# Patient Record
Sex: Female | Born: 1945 | Race: White | Hispanic: No | Marital: Single | Smoking: Former smoker
Health system: Southern US, Community
[De-identification: ages and names within clinical notes are randomized; demographics above are authoritative.]

## PROBLEM LIST (undated history)

## (undated) DIAGNOSIS — M810 Age-related osteoporosis without current pathological fracture: Secondary | ICD-10-CM

## (undated) DIAGNOSIS — Z87442 Personal history of urinary calculi: Secondary | ICD-10-CM

## (undated) DIAGNOSIS — E785 Hyperlipidemia, unspecified: Secondary | ICD-10-CM

## (undated) DIAGNOSIS — K219 Gastro-esophageal reflux disease without esophagitis: Secondary | ICD-10-CM

## (undated) DIAGNOSIS — F39 Unspecified mood [affective] disorder: Secondary | ICD-10-CM

## (undated) DIAGNOSIS — K5900 Constipation, unspecified: Secondary | ICD-10-CM

## (undated) DIAGNOSIS — I2510 Atherosclerotic heart disease of native coronary artery without angina pectoris: Secondary | ICD-10-CM

## (undated) DIAGNOSIS — F341 Dysthymic disorder: Secondary | ICD-10-CM

## (undated) DIAGNOSIS — Z89442 Acquired absence of left ankle: Secondary | ICD-10-CM

## (undated) DIAGNOSIS — Z8719 Personal history of other diseases of the digestive system: Secondary | ICD-10-CM

## (undated) DIAGNOSIS — R42 Dizziness and giddiness: Secondary | ICD-10-CM

## (undated) DIAGNOSIS — M6281 Muscle weakness (generalized): Secondary | ICD-10-CM

## (undated) DIAGNOSIS — I10 Essential (primary) hypertension: Secondary | ICD-10-CM

## (undated) DIAGNOSIS — Z87728 Personal history of other specified (corrected) congenital malformations of nervous system and sense organs: Secondary | ICD-10-CM

## (undated) DIAGNOSIS — G4730 Sleep apnea, unspecified: Secondary | ICD-10-CM

## (undated) DIAGNOSIS — G629 Polyneuropathy, unspecified: Secondary | ICD-10-CM

## (undated) DIAGNOSIS — Z87440 Personal history of urinary (tract) infections: Secondary | ICD-10-CM

## (undated) DIAGNOSIS — G8929 Other chronic pain: Secondary | ICD-10-CM

## (undated) DIAGNOSIS — R609 Edema, unspecified: Secondary | ICD-10-CM

## (undated) DIAGNOSIS — J449 Chronic obstructive pulmonary disease, unspecified: Secondary | ICD-10-CM

## (undated) DIAGNOSIS — G839 Paralytic syndrome, unspecified: Secondary | ICD-10-CM

## (undated) DIAGNOSIS — M4150 Other secondary scoliosis, site unspecified: Secondary | ICD-10-CM

## (undated) DIAGNOSIS — F0390 Unspecified dementia without behavioral disturbance: Secondary | ICD-10-CM

## (undated) DIAGNOSIS — E669 Obesity, unspecified: Secondary | ICD-10-CM

## (undated) DIAGNOSIS — G4700 Insomnia, unspecified: Secondary | ICD-10-CM

## (undated) HISTORY — PX: OTHER SURGICAL HISTORY: SHX169

## (undated) HISTORY — PX: COLON SURGERY: SHX602

## (undated) HISTORY — PX: ROBOT ASSISTED LAPAROSCOPIC COMPLETE CYSTECT ILEAL CONDUIT: SHX5139

## (undated) HISTORY — PX: COLOSTOMY: SHX63

---

## 2004-01-27 ENCOUNTER — Ambulatory Visit: Payer: Self-pay | Admitting: Gastroenterology

## 2005-02-09 ENCOUNTER — Ambulatory Visit: Payer: Self-pay | Admitting: Cardiology

## 2005-02-09 ENCOUNTER — Inpatient Hospital Stay (HOSPITAL_COMMUNITY): Admission: EM | Admit: 2005-02-09 | Discharge: 2005-02-11 | Payer: Self-pay | Admitting: Emergency Medicine

## 2005-02-09 ENCOUNTER — Encounter: Payer: Self-pay | Admitting: Cardiology

## 2005-03-22 ENCOUNTER — Encounter: Admission: RE | Admit: 2005-03-22 | Discharge: 2005-03-22 | Payer: Self-pay | Admitting: Family Medicine

## 2005-05-05 ENCOUNTER — Ambulatory Visit: Payer: Self-pay | Admitting: Gastroenterology

## 2007-10-16 ENCOUNTER — Inpatient Hospital Stay (HOSPITAL_COMMUNITY): Admission: EM | Admit: 2007-10-16 | Discharge: 2007-11-16 | Payer: Self-pay | Admitting: Emergency Medicine

## 2007-10-16 ENCOUNTER — Encounter: Payer: Self-pay | Admitting: Emergency Medicine

## 2007-10-18 ENCOUNTER — Encounter (INDEPENDENT_AMBULATORY_CARE_PROVIDER_SITE_OTHER): Payer: Self-pay | Admitting: Surgery

## 2008-02-25 ENCOUNTER — Ambulatory Visit: Payer: Self-pay | Admitting: Diagnostic Radiology

## 2008-02-25 ENCOUNTER — Inpatient Hospital Stay (HOSPITAL_COMMUNITY): Admission: AD | Admit: 2008-02-25 | Discharge: 2008-03-11 | Payer: Self-pay | Admitting: Internal Medicine

## 2008-02-25 ENCOUNTER — Encounter: Payer: Self-pay | Admitting: Emergency Medicine

## 2008-03-04 ENCOUNTER — Encounter (INDEPENDENT_AMBULATORY_CARE_PROVIDER_SITE_OTHER): Payer: Self-pay | Admitting: Internal Medicine

## 2008-03-06 ENCOUNTER — Ambulatory Visit: Payer: Self-pay | Admitting: Gastroenterology

## 2008-03-06 ENCOUNTER — Encounter (INDEPENDENT_AMBULATORY_CARE_PROVIDER_SITE_OTHER): Payer: Self-pay | Admitting: Internal Medicine

## 2008-06-04 ENCOUNTER — Emergency Department (HOSPITAL_COMMUNITY): Admission: EM | Admit: 2008-06-04 | Discharge: 2008-06-05 | Payer: Self-pay | Admitting: Emergency Medicine

## 2008-07-18 ENCOUNTER — Encounter: Admission: RE | Admit: 2008-07-18 | Discharge: 2008-07-18 | Payer: Self-pay | Admitting: Surgery

## 2009-02-26 ENCOUNTER — Encounter: Payer: Self-pay | Admitting: Cardiology

## 2009-03-05 ENCOUNTER — Encounter: Payer: Self-pay | Admitting: Cardiology

## 2009-03-28 ENCOUNTER — Encounter: Payer: Self-pay | Admitting: Cardiology

## 2009-09-11 ENCOUNTER — Encounter: Payer: Self-pay | Admitting: Cardiology

## 2009-12-10 ENCOUNTER — Encounter: Payer: Self-pay | Admitting: Cardiology

## 2009-12-30 ENCOUNTER — Encounter: Payer: Self-pay | Admitting: Cardiology

## 2010-01-15 ENCOUNTER — Emergency Department (HOSPITAL_COMMUNITY): Admission: EM | Admit: 2010-01-15 | Discharge: 2010-01-15 | Payer: Self-pay | Admitting: Emergency Medicine

## 2010-01-16 DIAGNOSIS — J4489 Other specified chronic obstructive pulmonary disease: Secondary | ICD-10-CM | POA: Insufficient documentation

## 2010-01-16 DIAGNOSIS — I2510 Atherosclerotic heart disease of native coronary artery without angina pectoris: Secondary | ICD-10-CM | POA: Insufficient documentation

## 2010-01-16 DIAGNOSIS — K219 Gastro-esophageal reflux disease without esophagitis: Secondary | ICD-10-CM

## 2010-01-16 DIAGNOSIS — J449 Chronic obstructive pulmonary disease, unspecified: Secondary | ICD-10-CM | POA: Insufficient documentation

## 2010-01-20 ENCOUNTER — Ambulatory Visit: Payer: Self-pay | Admitting: Cardiology

## 2010-01-20 ENCOUNTER — Encounter: Payer: Self-pay | Admitting: Cardiology

## 2010-01-20 DIAGNOSIS — R079 Chest pain, unspecified: Secondary | ICD-10-CM | POA: Insufficient documentation

## 2010-01-20 DIAGNOSIS — E785 Hyperlipidemia, unspecified: Secondary | ICD-10-CM | POA: Insufficient documentation

## 2010-01-20 DIAGNOSIS — I10 Essential (primary) hypertension: Secondary | ICD-10-CM

## 2010-01-20 DIAGNOSIS — R0602 Shortness of breath: Secondary | ICD-10-CM

## 2010-01-27 ENCOUNTER — Telehealth (INDEPENDENT_AMBULATORY_CARE_PROVIDER_SITE_OTHER): Payer: Self-pay | Admitting: Radiology

## 2010-01-28 ENCOUNTER — Ambulatory Visit: Payer: Self-pay

## 2010-01-28 ENCOUNTER — Encounter (HOSPITAL_COMMUNITY)
Admission: RE | Admit: 2010-01-28 | Discharge: 2010-03-14 | Payer: Self-pay | Source: Home / Self Care | Attending: Cardiology | Admitting: Cardiology

## 2010-01-28 ENCOUNTER — Encounter: Payer: Self-pay | Admitting: Cardiovascular Disease

## 2010-01-28 ENCOUNTER — Ambulatory Visit: Payer: Self-pay | Admitting: Cardiovascular Disease

## 2010-02-02 ENCOUNTER — Telehealth: Payer: Self-pay | Admitting: Cardiovascular Disease

## 2010-04-14 NOTE — Letter (Signed)
Summary: Patient Encounter  Patient Encounter   Imported By: Marylou Mccoy 02/11/2010 09:43:33  _____________________________________________________________________  External Attachment:    Type:   Image     Comment:   External Document

## 2010-04-14 NOTE — Assessment & Plan Note (Signed)
Summary: dx chest pains/mt   Visit Type:  Follow-up Referring Provider:  [place] Primary Provider:  Royanne Foots, MD  CC:  chest pain.  History of Present Illness: The patient was referred for evaluation of chest discomfort.  She has no prior cardiac history though it EKG was abnormal years ago suggesting a possible infarct. She was in the emergency room recently because of chest discomfort. I reviewed these records. Enzymes were negative. Chest x-ray was negative. She was told she was having anxiety. She was treated with hydrocodone, Maalox and Pepcid. The pain was on 11/3. She been having similar symptoms for 6 months but it had become slowly progressive. She described a chest heaviness are hollow feeling in her mid chest radiating to both sides of the sternum. She had some left neck discomfort. She was short of breath. There was some mild diaphoresis. It came on at rest. It didn't go away until she left the emergency room. She has had a couple of episodes since then one lasting one hour and another 20 minutes. At peak intensity is 9/10. It is not like other symptoms she has had in the past. He does seem to be somewhat positional made worse lying flat back or taking a deep breath. She has not had fevers cough or chills.  Current Medications (verified): 1)  Spiriva Handihaler 18 Mcg Caps (Tiotropium Bromide Monohydrate) .... As Directed 2)  Aspirin 81 Mg  Tabs (Aspirin) .Marland Kitchen.. 1 By Mouth Daily 3)  Ultram 50 Mg Tabs (Tramadol Hcl) .... As Needed 4)  Nadolol 40 Mg Tabs (Nadolol) .Marland Kitchen.. 1 By Mouth Daily 5)  Protonix 40 Mg Solr (Pantoprazole Sodium) .Marland Kitchen.. 1 By Mouth Daily 6)  Sucralfate 1 Gm/58ml Susp (Sucralfate) .... Qid 7)  Zinc Oxide 20 % Oint (Zinc Oxide) .... As Directed 8)  Zyrtec Allergy 10 Mg Tabs (Cetirizine Hcl) .Marland Kitchen.. 1 By Mouth Daily 9)  Trazodone Hcl 100 Mg Tabs (Trazodone Hcl) .... As Needed 10)  Complete Allergy 25 Mg Caps (Diphenhydramine Hcl) .Marland Kitchen.. 1 By Mouth As Needed 11)  Klor-Con 10  10 Meq Cr-Tabs (Potassium Chloride) .Marland Kitchen.. 1 By Mouth As Needed 12)  Miralax  Powd (Polyethylene Glycol 3350) .... As Directed 13)  Ativan 0.5 Mg Tabs (Lorazepam) .Marland Kitchen.. 1 By Mouth As Needed 14)  Furosemide 20 Mg Tabs (Furosemide) .Marland Kitchen.. 1 By Mouth Daily As Needed 15)  Systane Preservative Free 0.4-0.3 % Soln (Polyethyl Glycol-Propyl Glycol) .... Three Times A Day 16)  Pataday 0.2 % Soln (Olopatadine Hcl) .... As Directed 17)  Effexor Xr 75 Mg Xr24h-Cap (Venlafaxine Hcl) .Marland Kitchen.. 1 By Mouth Daily 18)  Calmoseptine 0.44-20.625 % Oint (Menthol-Zinc Oxide) .... Three Times A Day  Allergies (verified): 1)  ! Penicillin 2)  ! Erythromycin 3)  ! Codeine  Past History:  Past Medical History: Spina bifida Scoliosis COPD Hyperlipidemia x2 years Hypertension borderline Osteoporosis Gastroesophageal reflux Depression/dysthymic disorder  Past Surgical History: Amputation of right foot Bilateral leg surgeries as a child Cholecystectomy Diverting colostomy Ureterostomy     Family History:  The patient's mother died at age 36 secondary to breast  cancer. Her father was killed in the [place] War. She has two sisters both who have had double mastectomies, one secondary to breast cancer and the other for prophylaxis for breast cancer. One of her sisters also has had an acute MI.  Social History: The patient is divorced and lives in [place].  She uses an electric wheelchair to get around. She quit smoking approximately 5 years ago  but prior to this was a 1/2 pack a day smoker. She is permanently disabled. Denies any alcohol or drug use. Does not have any children.  Review of Systems       Dizziness. Otherwise as stated in the history of present illness negative for other systems.  Vital Signs:  Patient profile:   65 year old female Pulse rate:   68 / minute Resp:     16 per minute BP sitting:   110 / 58  (left arm)  Vitals Entered By: Marrion Coy, CNA (January 20, 2010 9:02 AM)  Physical  Exam  General:  No acute distress Head:  normocephalic and atraumatic Eyes:  Dysconjugate gaze Mouth:  Poor dentition otherwise unremarkable Neck:  Neck supple, no JVD. No masses, thyromegaly or abnormal cervical nodes. Chest Wall:  no deformities or breast masses noted Lungs:  Clear bilaterally to auscultation and percussion. Abdomen:  No rebound, no guarding, positive bowel sounds, unable to appreciate midline pulsatile mass or organomegaly the patient in a wheelchair.  Positive colostomy and ileostomy Msk:  diffuse muscle atrophy patient in a wheelchair, scoliosis Extremities:  status post left foot amputation, moderate edema right foot Neurologic:  Alert and oriented x 3. Cervical Nodes:  no significant adenopathy Psych:  Normal affect.   Detailed Cardiovascular Exam  Neck    Carotids: Carotids full and equal bilaterally without bruits.      Neck Veins: Normal, no JVD.    Heart    Palpation: normal PMI with no thrills palpable.      Auscultation: regular rate and rhythm, S1, S2 without murmurs, rubs, gallops, or clicks.    Vascular    Abdominal Aorta: Unable to assess with the patient seated    Femoral Pulses: Unable to assess with the patient seated    Pedal Pulses: Difficult to assess in the right leg with edema. She is status post left foot amputation   EKG  Procedure date:  01/20/2010  Findings:      Sinus rhythm, rate 68, left axis deviation, no acute ST-T wave changes  Impression & Recommendations:  Problem # 1:  CHEST PAIN (ICD-786.50) Her chest pain is somewhat atypical but she does have cardiovascular risk factors. First I will check a d-dimer. If this is negative I would not consider CT but would perform a pharmacologic stress perfusion study to rule out ischemia. Orders: T-D-Dimer Fibrin Derivatives Quantitive [phone])  Problem # 2:  ESSENTIAL HYPERTENSION, BENIGN (ICD-401.1) Her blood pressure is controlled. She will continue the meds as  listed.  Problem # 3:  DYSLIPIDEMIA (ICD-272.4) Goals for her dyslipidemia will be sent based on the above workup.  Other Orders: EKG w/ Interpretation (93000)  Patient Instructions: 1)  Your physician recommends that you schedule a follow-up appointment as needed after testing 2)  Your physician recommends that you have  lab work today: D-Dimer  3)  Your physician recommends that you continue on your current medications as directed. Please refer to the Current Medication list given to you today. 4)  If the lab work is abnormal you will be scheduled for a spiral CT.

## 2010-04-14 NOTE — Letter (Signed)
Summary: Physician Visit Form  Physician Visit Form   Imported By: Marylou Mccoy 02/11/2010 09:17:42  _____________________________________________________________________  External Attachment:    Type:   Image     Comment:   External Document

## 2010-04-14 NOTE — Miscellaneous (Signed)
Summary: order for adenosine myoview  Clinical Lists Changes  Orders: Added new Referral order of Nuclear Stress Test (Nuc Stress Test) - Signed 

## 2010-04-14 NOTE — Progress Notes (Signed)
Summary: pt calling for results of echo  Phone Note Call from Patient   Caller: Patient [phone] Reason for Call: Talk to Nurse Summary of Call: pt calling re results of echo done last week Initial call taken by: Glynda Jaeger,  February 02, 2010 11:31 AM  Follow-up for Phone Call        Phone Call Completed PT AWARE OF MYOVIEW RESULTS NO ECHO DONE Follow-up by: Scherrie Bateman, LPN,  February 02, 2010 12:45 PM

## 2010-04-14 NOTE — Assessment & Plan Note (Signed)
Summary: Cardiology Nuclear Testing  Nuclear Med Background Indications for Stress Test: Evaluation for Ischemia, Post Hospital  Indications Comments: 01/15/10 [facility] ER - CP / SOB (-) enzymes  History: COPD, Echo  History Comments: '09 Echo  EF= 60-65%  Symptoms: Chest Pain, DOE, Fatigue, SOB    Nuclear Pre-Procedure Cardiac Risk Factors: History of Smoking, Hypertension, Lipids Caffeine/Decaff Intake: None NPO After: 6:00 PM Lungs: clear IV 0.9% NS with Angio Cath: 24g     IV Site: R Antecubital IV Started by: Irean Hong, RN Chest Size (in) 42     Cup Size C     Height (in): 59 Weight (lb): 206 BMI: 41.76 Tech Comments: Held nadalol this am.  Nuclear Med Study 1 or 2 day study:  1 day     Stress Test Type:  Eugenie Birks Reading MD:  Charlton Haws, MD     Referring MD:  Shela Commons.Hochrein Resting Radionuclide:  Technetium 63m Tetrofosmin     Resting Radionuclide Dose:  11 mCi  Stress Radionuclide:  Technetium 53m Tetrofosmin     Stress Radionuclide Dose:  33 mCi   Stress Protocol  Max Systolic BP: 112 mm Hg Lexiscan: 0.4 mg   Stress Test Technologist:  Milana Na, EMT-P     Nuclear Technologist:  Domenic Polite, CNMT  Rest Procedure  Myocardial perfusion imaging was performed at rest 45 minutes following the intravenous administration of Technetium 45m Tetrofosmin.  Stress Procedure  The patient received IV Lexiscan 0.4 mg over 15-seconds.  Technetium 63m Tetrofosmin injected at 30-seconds.  There were no significant changes with infusion.  Quantitative spect images were obtained after a 45 minute delay.  QPS Raw Data Images:  Normal; no motion artifact; normal heart/lung ratio. Stress Images:  Normal homogeneous uptake in all areas of the myocardium. Rest Images:  Normal homogeneous uptake in all areas of the myocardium. Subtraction (SDS):  Normal Transient Ischemic Dilatation:  .98  (Normal <1.22)  Lung/Heart Ratio:  .33  (Normal <0.45)  Quantitative Gated Spect  Images QGS EDV:  56 ml QGS ESV:  18 ml QGS EF:  68 % QGS cine images:  normal  Findings Normal nuclear study      Overall Impression  Exercise Capacity: Lexiscan with no exercise. BP Response: Normal blood pressure response. Clinical Symptoms: Dyspnea and Dizzy ECG Impression: No significant ST segment change suggestive of ischemia. Overall Impression: Normal stress nuclear study.  Appended Document: Cardiology Nuclear Testing Negative adequate ETT.  Appended Document: Cardiology Nuclear Testing PT AWARE./CY

## 2010-04-14 NOTE — Letter (Signed)
Summary: [facility] - Admission Hx & Physical  [facility] - Admission Hx & Physical   Imported By: Marylou Mccoy 02/11/2010 09:48:42  _____________________________________________________________________  External Attachment:    Type:   Image     Comment:   External Document

## 2010-04-14 NOTE — Progress Notes (Signed)
Summary: nuc pre-procedure  Phone Note Outgoing Call   Call placed by: Domenic Polite, CNMT,  January 27, 2010 4:08 PM Call placed to: Patient Summary of Call: Reviewed information on Myoview Information Sheet (see scanned document for further details).  Spoke with nursing home.  Initial call taken by: Domenic Polite, CNMT,  January 27, 2010 4:08 PM     Nuclear Med Background Indications for Stress Test: Evaluation for Ischemia, Post Hospital  Indications Comments: 01/15/10 [facility] ER - CP / SOB (-) enzymes  History: COPD, Echo  History Comments: '09 Echo  EF= 60-65%  Symptoms: Chest Pain, SOB    Nuclear Pre-Procedure Cardiac Risk Factors: History of Smoking, Hypertension, Lipids

## 2010-05-26 LAB — CBC
HCT: 38.8 % (ref 36.0–46.0)
Hemoglobin: 11.9 g/dL — ABNORMAL LOW (ref 12.0–15.0)
MCHC: 30.7 g/dL (ref 30.0–36.0)
RDW: 14.3 % (ref 11.5–15.5)
WBC: 8.8 10*3/uL (ref 4.0–10.5)

## 2010-05-26 LAB — DIFFERENTIAL
Basophils Relative: 1 % (ref 0–1)
Eosinophils Absolute: 0.4 10*3/uL (ref 0.0–0.7)
Eosinophils Relative: 4 % (ref 0–5)
Lymphs Abs: 3.2 10*3/uL (ref 0.7–4.0)
Monocytes Relative: 7 % (ref 3–12)

## 2010-05-26 LAB — COMPREHENSIVE METABOLIC PANEL
ALT: 23 U/L (ref 0–35)
AST: 23 U/L (ref 0–37)
Alkaline Phosphatase: 96 U/L (ref 39–117)
CO2: 30 mEq/L (ref 19–32)
Calcium: 8.7 mg/dL (ref 8.4–10.5)
GFR calc Af Amer: >60 mL/min (ref >60)
Potassium: 3.7 mEq/L (ref 3.5–5.1)
Sodium: 140 mEq/L (ref 135–145)
Total Protein: 6.8 g/dL (ref 6.0–8.3)

## 2010-05-26 LAB — POCT CARDIAC MARKERS
CKMB, poc: <1 ng/mL — ABNORMAL LOW (ref 1.0–8.0)
Myoglobin, poc: 40.9 ng/mL (ref 12–200)
Troponin i, poc: <0.05 ng/mL (ref 0.00–0.09)

## 2010-07-28 NOTE — Procedures (Signed)
EEG NUMBER:  [id number]   AGE:  65 years old.   This is a female patient currently in room 4740 at [facility].   ORDERING PHYSICIAN:  [facility], Dr. Susie Cassette.   The patient's recording was performed on March 06, 2008 and reported  on March 07, 2008.  In dictation, the patient was recorded for 20.5  minutes in a portable study and is described as awake, and alert, right-  handed Caucasian individual, not exposed to hyperventilation or photic  stimulation, due to the portability of the machine.   MEDICATIONS:  MiraLax, Norvasc, Toprol, TriCor, Diamox, Protonix,  Duragesic, vancomycin, Phenergan, Zofran, Ambien, Tylenol, Reglan,  fentanyl patch, and Apresoline.  The patient is due to nausea on a  restricted diet.   HISTORY:  This is a 65 year old female with a history of Spina bifida,  communicating hydrocephalus who since September has been seriously ill.  She developed an incarcerated small bowel hernia that had to be  surgically treated and the bowel partially  removed.  She had fecal impaction.  She she returned to the hospital a week after  her discharge from [facility] and shows [facility] for  her next admission.  Also, her outpatient physicians are [facility] physicians.  She developed nausea and severe headaches.  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  Also, no hypertensive crisis has been recorded and an LP was attempted,  but because of the history of Spina bifida and deep venous thrombosis in  the past, had to be deferred until her anticoagulation.  There is an actual concern of an infection spreading systemically,this  had been voiced, but she did not develop any rigor of the neck, cranial  nerve abnormalities, and truly has no mental status changes, is fluent  in her speech and presents mainly as a patient complaining of pain  everywhere with a depressed  affect.   A posterior dominant background rhythm is seen at a normal frequency of  8 Hz.  Heart rate is between 68 and 74 and regular.  The patient is on  nasal O2 during this EEG recording.  She had swallowing movements  occasionally mound or ground softly, but she is able to communicate  fluently.  The 8-Hz posterior dominant rhythm is considered normal for  an adult.  At times, I was able to identify 9-Hz, but only intermittently.  Frontal  polar channels show EMG artifact over the frontalis muscle.  There is no asymmetry between left and right hemisphere noted and the  photic stimulation that would be so important for a patient with PRES  syndrome was deferred for unknown reasons.   CONCLUSION:  This is a normal EEG for the patient's age and conscious  state shows neither slowing, no focal abnormality.  It would have been  helpful to have a photic stimulus applied in a patient with a posterior  reversible  occipital ischemia, however, the main intent of EEG was to show that  there is no encephalopathy and no focal seizure activity.      Melvyn Novas, M.D.  Electronically Signed     EA:[id number]  D:  03/07/2008 10:59:29  T:  03/07/2008 81:19:14  Job #:  [id number]

## 2010-07-28 NOTE — Discharge Summary (Signed)
NAME:  Deborah Young, Deborah Young               ACCOUNT NO.:  [id number]   MEDICAL RECORD NO.:  [id number]          PATIENT TYPE:  INP   LOCATION:  5503                         FACILITY:  [facility]   PHYSICIAN:  Richarda Overlie, MD       DATE OF BIRTH:  1945/11/19   DATE OF ADMISSION:  02/25/2008  DATE OF DISCHARGE:  03/11/2008                               DISCHARGE SUMMARY   DISCHARGE DIAGNOSES:  1. Intractable nausea likely secondary to gastroesophageal reflux      disease, hiatal hernia.  2. Diarrhea ruled out for Clostridium colitis.  3. Acute on chronic low back pain.  4. Cellulitis of the lower back.  5. Intractable headache thought to be secondary to reversible      posterior leukoencephalopathy syndrome.  6. Uncontrolled hypertension.  7. Communicating hydrocephalus.  8. Hypotension requiring vasopressor secondary to narcotic      medications, antihypertensive ruled out for any underlying      infection.  9. Chronic obstructive pulmonary disease stable.  10.Hypokalemia repleted.  11.Hiatal hernia with chronic gastroesophageal reflux disease.  12.Spina bifida.   CONSULT:  1. Surgery consult by Dr. Consuello Bossier.  2. Neurology consultation with Dr. Porfirio Mylar Dohmeier.  3. GI consultation by [facility].   SUBJECTIVE:  This is a 65 year old female with a history of spina  bifida, status post chronic bedridden state, currently a resident of a  nursing home who presented to the ER with nausea, persistent vomiting  and diarrhea.  She described the vomitus as being greenish to yellow.  She denied any sick contacts but was recently discharged from [facility] where she was admitted for COPD exacerbation and pneumonia.  At  the time of her admission, the patient was found to be febrile with a  temperature of 100.5 with hypertensive systolic blood pressures in the  180s and 190s.  Initial evaluation showed a mildly increased lipase of  105.  Abdominal series revealed a 9 cm  stool volume in the rectum,  otherwise no evidence of air-fluid levels. CT scan of the abdomen and  pelvis with contrast showed no acute findings, no bowel obstruction and  just a hiatal hernia.  She was found to have a fecal impaction in the  rectum that  was unchanged compared to her previous imaging study.  Because of the patient's recent surgery for repair of a complex  parastomal hernia with a mesh associated with small bowel focalization  and prolonged postoperative ileus in September 2009, a Surgical  consultation was obtained which was done by Dr. Zachery Dakins.  The patient  was ruled out for C diff colitis and ischemic colitis. Colostomy output  was found to be within normal limits.  The patient was empirically  started on treatment with IV Flagyl which was later discontinued.  Her  urinalysis showed no growth.  The patient was started on a clear liquid  diet.  From a surgical standpoint, there was no need for any further  surgical intervention.  GI was also consulted for her intractable nausea and, according to their  assessment, the patient's nausea was  probably due to chronic narcotic  use being that she had an increased incidence of gastroesophageal reflux  disease also aggravated by her hiatal hernia.  Her intractable nausea  could also be related to her chronic headaches.  Having an abdominal  surgery could create adhesions that could cause serious small bowel  obstruction.  It was recommended to continue her on scheduled  antiemetics and limit her narcotic medications.   Intractable headache: secondary to reversible posterior  leukoencephalopathy syndrome due to uncontrolled hypertension.  The patient was found to be quite hypertensive initially with systolic  blood pressure in the 190-200 range.  Her blood pressure was treated  with multiple antihypertensive medications including Norvasc,  hydralazine and metoprolol.  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  Neurology was  consulted and it was suggested that the patient probably had reversible  posterior leukoencephalopathy syndrome secondary to her high blood  pressure and aggressive blood pressure control was recommended.  She was  started on empiric Solu-Medrol.  An EEG was done, which is still  pending.  A lumbar puncture was done and she was ruled out for  meningitis.  Cryptococcus  antigen, negative , Gram stain was negative ,  no albumino- cytologic-dissociation was seen.   Low back cellulitis: The patient was started on empiric treatment with  vancomycin.  Blood cultures remain negative to date.  For her sacral  cellulitis, she was transitioned from IV vancomycin to oral  ciprofloxacin which she will continue for another 2 weeks.  Hypertension secondary to antihypertensive medications:  Narcotic sepsis  was ruled out.  Gastroesophageal reflux disease:  The patient was continued on b.i.d.  PPI.   DISPOSITION:  The patient is being transferred back to SNF tomorrow.  Her diet was advanced and she was able to tolerate a mechanical soft  diet prior to discharge.  The patient's condition and discharge plan was  notified to [facility], the patient's sister at [phone].      Richarda Overlie, MD  Electronically Signed     NA/MEDQ  D:  03/10/2008  T:  03/10/2008  Job:  [id number]   cc:   Anselm Pancoast. Zachery Dakins, M.D.  Melvyn Novas, M.D.  [facility]

## 2010-07-28 NOTE — Op Note (Signed)
NAME:  Deborah Young, Deborah Young               ACCOUNT NO.:  [id number]   MEDICAL RECORD NO.:  [id number]          PATIENT TYPE:  INP   LOCATION:  3315                         FACILITY:  [facility]   PHYSICIAN:  Wilmon Arms. Corliss Skains, M.D. DATE OF BIRTH:  [birth year]   DATE OF PROCEDURE:  10/18/2007  DATE OF DISCHARGE:                               OPERATIVE REPORT   PREOPERATIVE DIAGNOSES:  1. Incarcerated parastomal hernia.  2. Colonic obstruction.   POSTOPERATIVE DIAGNOSIS:  Incarcerated parastomal hernia with massive  colonic impaction.   PROCEDURE PERFORMED:  1. Exploratory laparotomy.  2. Extensive lysis of adhesions (120 minutes).  3. Evacuation of colonic impaction.  4. Repair of parastomal hernia with biologic mesh.   SURGEON:  Wilmon Arms. Corliss Skains, MD   ASSISTANTS:  1. Letha Cape, PA-C  2. Thomas A. Cornett, MD  3. Ardeth Sportsman, MD   ANESTHESIA:  General endotracheal.   INDICATIONS:  The patient is a 65 year old female born with spina bifida  who has a permanent ileal conduit as well as a left upper quadrant  colostomy.  The stomas have been present for over 40 years.  The patient  presented with firmness and tenderness around her left side of  colostomy.  A CT scan showed that she had a large parastomal hernia  containing large loops of colon.  She was managed with conservative  treatment for a couple of days, but she has not opened up.  She presents  now for surgical repair.   DESCRIPTION OF PROCEDURE:  The patient is brought to the operating room  and placed in a supine position on the operating room table.  After an  adequate level of general anesthesia was obtained, her right lower  quadrant ileal conduit was hooked to a Foley bag.  Her left-sided  colostomy was closed with a 2-0 silk pursestring suture.  The ileal  conduit was isolated with a 10 x 10 drape.  Her abdomen was then prepped  with Betadine and draped in a sterile fashion.  We made a vertical  incision in her  old left paramedian incision.  Dissection was carried  down to the fascia which was opened vertically.  We entered the  peritoneal cavity and opened the incision widely.  The patient has  significant omental adhesions to the anterior abdominal wall.  We spent  sometime taking down these adhesions.  The small bowel appeared normal.  There were 3 different loops of colon seen to be heading towards the  left upper quadrant.  It was difficult to determine which portion of the  colon was which.  We did locate the ascending colon, across the  transverse colon.  We followed this towards the left upper quadrant.  It  took over 2 hours with meticulous dissection and lysis of adhesions to  reduce the hernia.  This also required making a counter incision in the  left lower quadrant into the subcutaneous space.  I inserted some  fingers and was able to manually reduce the massively impacted colon  that had herniated up into the parastomal hernia sac.  This also  required opening the fascia slightly.  Once we had reduced this colon,  it was fairly obvious that there would be no way that these large hard  balls of stool measuring about 8 cm across would be able to make it out  through the colostomy.  Therefore, we isolated a portion of the  transverse colon.  We opened the colon along its tinea and evacuated  several large hard balls of stool.  The colotomy was then closed with a  TA-60 stapler.  We then were finally able to identify the colostomy.  The patient has a very long redundant colon and the colostomy headed  down towards the pelvis and back up into the left upper quadrant.  She  had a very high splenic flexure.  The patient also has a Hartmann pouch  which seems to be filled with inspissated mucus.  It took significant  meticulous dissection to take down the splenic flexure and mobilize the  colon medially.  Once we had mobilized the splenic flexure, we found  again that there were large hard  balls of stool in the colon in this  area.  This required making a second colotomy and evacuating another  basin full of hard stool.  This colotomy was also closed with a TA-60  stapler.  Note, all of the colon and small bowel had been reduced.  We  exposed the abdominal wall around the stoma site.  The abdomen was  thoroughly irrigated with saline and suctioned dry.  We used 4  interrupted #1 Novofil sutures to tighten up the stomal opening.  We  then used an 8 x 12 cm sheet of thick FlexHD biologic mesh.  I cut a  slit in it to allow the colostomy to pass.  We then sutured this in  place with interrupted #1 Novofil pop-off sutures.  We sewed this in  circumferentially around the colostomy.  We then thoroughly irrigated  the abdomen again.  The fascia was reapproximated with double-stranded  #1 PDS suture.  The subcutaneous tissues were irrigated.  The skin  incisions were all closed with staples.  An ostomy appliance was  applied.  The patient was then extubated and brought to recovery in  stable condition.  All sponge, instrument, and needle counts were  correct.      Wilmon Arms. Tsuei, M.D.  Electronically Signed     MKT/MEDQ  D:  10/18/2007  T:  10/19/2007  Job:  [id number]

## 2010-07-28 NOTE — Discharge Summary (Signed)
NAME:  Deborah Young, Deborah Young               ACCOUNT NO.:  [id number]   MEDICAL RECORD NO.:  [id number]          PATIENT TYPE:  INP   LOCATION:  5157                         FACILITY:  [facility]   PHYSICIAN:  Adolph Pollack, M.D.DATE OF BIRTH:  1946/03/13   DATE OF ADMISSION:  10/16/2007  DATE OF DISCHARGE:  11/16/2007                               DISCHARGE SUMMARY   PROCEDURE:  On the date of October 18, 2007, the patient underwent an  exploratory laparotomy with extensive lysis of adhesions, evacuation of  colonic impaction, and repair of a parastomal hernia with biologic mesh.  This was done by Dr. Corliss Skains.   CONSULTATIONS:  There were no medical consultants, however, wound care  nurse was consulted for helping with VAC changes and physical therapy  was consulted for physical therapy.   REASON FOR ADMISSION:  Deborah Young is a 65 year old white female who has  a history of a colostomy, as well as an ileal conduit for approximately  30 years due to her spina bifida.  She presented to the emergency  department on the day of admission with the complaint of nausea and  vomiting for the past several days.  She states that she did have a  bowel movement through her ostomy. At this time, she denies any fevers.  She was sent to the emergency department and was found to have a  parastomal hernia.  At this time, there was a question of a partial  obstruction.  Therefore, at this time the patient was admitted for bowel  rest and NG suction, with the possibility of an exploratory laparotomy  if the patient did not begin to open up over the next couple of days.   ADMITTING DIAGNOSES:  1. History of spina bifida.  2. Partial small bowel obstruction secondary to parastomal hernia.  3. Severe constipation.  4. Gastroesophageal reflux disease.   HOSPITAL COURSE:  On October 16, 2007, the patient was admitted and  started bowel rest, as well as NG tube placed.  Over the next two days,  the patient continued  to have pain due to this hernia with no resolution  of her partial small bowel obstruction.  Therefore, on October 18, 2007,  the patient was taken to the operating room, where an exploratory  laparotomy with lysis of adhesions, evacuation of chronic impaction, and  repair of a parastomal hernia with biologic mesh was performed by Dr.  Corliss Skains.  The patient seemed to tolerate this procedure well.  However,  due to the extensive anesthesia time, the patient was transferred to a  step-down unit post surgery.  For the next several days, the patient  continued on bowel rest with an NG tube.  By postoperative day #3, the  patient was continuing to have an ileus, as this was expected.  Therefore, at this time a PICC line was placed and on the following day  TNA was started.  Over the next several days, the patient continued with  an ileus.  However, by postop day #7, it seemed that the patient was  beginning to resolve her ileus and therefore, the  NG tube was  discontinued and she was started on clear liquids.  Also, at this time  the patient's Pincus Sanes was discontinued.   On postoperative day #8, the patient began complaining of some  incisional pain and at this time several staples were removed and the  wound was opened.  A culture of the material that was coming out of her  wound was sent and eventually the wound was large enough that a wound  VAC was placed to assist with healing.  Also, the patient began to have  some swelling of her upper incision and this was also opened slightly.  This was a much smaller opening than her anterior opening and therefore,  this superior wound was packed with normal saline wet-to-dry dressing  changes and changed every day.  Currently at this same time, the  patient's diet was continuing to be advanced.  However, the patient  began to once again complain of nausea and her ostomy began putting out  less and less stool each day.  By postoperative day 10, the  patient was  having no flatus in her ostomy anymore and having severe nausea and  therefore, once again she had an ileus.  An NG tube was placed and the  patient was made n.p.o. and her TNA was continued.  The patient  continued like this for several more days.   By postoperative day 15, the patient seemed to be resolving her ileus  again and at this time, her NG tube was once again discontinued and she  was started on clears and her diet was advanced at this time.  However,  once again the patient began to get increasing nausea with this, as well  as some abdominal pain.  Also, she began to get more distended and her  ostomy did not put out as much and she began to no longer have flatus.  Therefore, after some persuasion the patient was willing to replace her  NG tube once again.  Once again, for the next several days the patient  was continued n.p.o. with the NG tube in.  By postoperative day 26, the  patient was having a decrease in her NG output and was beginning to have  more gas and stool in her bag with decrease in pain.  Therefore, at this  time her NG tube was discontinued and her diet was once again advanced.  Over the next several days, the patient continued tolerating her diet  and at this time her TNA was discontinued.   Also during this hospitalization, physical therapy was ordered for the  patient.  Because of her spina bifida and the fact that in the past she  has had an above-the-ankle amputation, she was mainly bed-bound.  However, physical therapy did work with the patient and tried be get her  more conditioned prior to discharge back to the skilled nursing  facility.   Also on postoperative day 27, the patient did complain of some back  pain.  At this time, she stated that she felt like she was having  urinary tract infection-type symptoms.  Therefore, at this time a  urinalysis along with culture and sensitivities were checked.  Also, I  talked to Alexis, the  physical therapist to work with Deborah Young and she  stated that the day prior day, they did a manual transfer with the  patient and this was extremely difficult and the patient may have  suffered some muscle strain due to this transfer.  Later that day, the  patient's urinalysis came back and showed that she probably did have a  UTI.  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  On postoperative day 28, the  culture came back showing greater than 100,000 colonies of multiple  organisms.  At this time though, the patient was saying that her back  pain was feeling better.  Therefore, at this time we felt that the  culture was showing her chronic colonization and this was not an acute  flare for the UTI.  Therefore, we did not start the patient on any  antibiotics.   Otherwise at this time, I have contacted case management, as well as  social work and it appears that all criteria have been completed in  order to get the patient back to [facility], which is her skilled nursing  facility that she stays at.   DISCHARGE DIAGNOSES:  1. Spina bifida.  2. Partial small bowel obstruction secondary to a parastomal hernia.  3. Stool impaction/chronic constipation.  4. Status post exploratory laparotomy with lysis of adhesions,      evacuation of colonic impaction, and repair of parastomal hernia      with biologic mesh.  5. Protein calorie malnutrition, requiring TNA (total nutrient      admixture), which has improved with a final prealbumin of 19.8 at      discharge.  6. Prolonged postoperative ileus, which also has resolved.  7. Deconditioning, which the patient will probably need to continue      physical therapy at [facility].  8. Gastroesophageal reflux disease.  9. Chronic obstructive pulmonary disease.  10.Coronary artery disease.  11.History of only one kidney.   DISCHARGE MEDICATIONS:  The patient is to continue most of  all of her  home medications.  However, she is informed that she needs to stop  taking her Imodium AD, as well as her Questran oral suspension powder.  Otherwise, she is to continue taking her:  1. Toprol XL extended release tablet 25 mg once daily.  2. Aspirin 81 mg daily.  3. Diamox 125 mg t.i.d.  4. Mucinex ER 600 mg q.12 h.  5. Fentanyl transdermal patch 25 mcg q.72 h.  6. Tylenol ER 650 mg two tablets q.8 h. as needed.  7. Albuterol aerosol inhaler 90 mcg q.4 h. as needed for shortness of      breath, and I believe she is supposed to get 2 puffs of that.  8. Spiriva 18 mcg every morning.  9. Zantac 75 mg tablet q.12 h.  10.Tums chewable 500 mg p.r.n.  11.Albuterol solution 2.5 mg/3 mL it looks like q.6 h. p.r.n.  12.Albuterol solution 2.5 mg/3 mL.  Both of these last two are      nebulizers and this one is q.8 h.  13.Celexa 30 mg daily.  14.Ambien 5 mg at bedtime for insomnia.   Other new addition to this list is:  1. Percocet 5/325 mg one to two tablets every 4 hours as needed for      pain.  2. MiraLax 17 grams in 8 ounces of  fluid t.i.d. or three times a day      until further notice.   DISCHARGE INSTRUCTIONS:  The patient at this time is to return to  [facility] skilled nursing facility.  Once at this facility until further  told by Dr. Corliss Skains, the patient is to continue on a soft diet.  As far as  her activity, she is to increase her activity slowly and at this time we  would recommend continuation of physical therapy while at the skilled  nursing facility to improve the patient's deconditioning.  Otherwise,  she may shower, but she is not to bathe for the next couple of weeks.  Otherwise, she is also not to lift anything heavy, greater than  approximately 10 to 15 pounds for the next four weeks.  Wound care:  The  patient does have a superior wound upper incision that is to be changed  once a day with normal saline moistened gauze packed in this wound and  covered with  a dry dressing.  Otherwise, the patient also has a larger  inferior wound in her incision that currently has a VAC placed on this.  She will need this VAC dressing changed on Mondays, Wednesdays, and  Fridays.  Further recommendations at this time:  The patient is to  absolutely NOT receive any more Imodium or Questran.  This is due to the  patient having chronic constipation, as well as large stool balls still  in her colon.  Otherwise, the patient is informed that she is to call  our office to be seen sooner than her follow-up appointment if she  begins to get a fever greater than 101.5, new or worsening abdominal  pain, if her ostomy begins to stop having output which includes stool or  flatus, or her abdominal wound begins to drain purulent material.  Otherwise, the patient is to follow up with Dr. Corliss Skains, who performed her  operation at [facility] on November 28, 2007, at 9 a.m.  in the morning.  If the patient could please arrive 15 minutes early to  her appointment, that would be great.  The number to our office is [phone].   As far as the patient's back pain, if this does continue to persist and  the patient continues to complain that she feels like she is getting a  urinary tract infection, we would recommend at this time that a clean  specimen be obtained through an appropriate cleansing of her actual  ileal conduit.  And then place an I&O catheter actually into her  urostomy and receive a clean catch specimen of urine and then send this  for a urinalysis, culture and sensitivity to further determine if the  patient is actually truly having a urinary tract infection or if her  pain continues to be musculoskeletal.      Letha Cape, PA      Adolph Pollack, M.D.  Electronically Signed    KEO/MEDQ  D:  11/16/2007  T:  11/16/2007  Job:  [id number]   cc:   Wilmon Arms. Tsuei, M.D.

## 2010-07-28 NOTE — Discharge Summary (Signed)
NAME:  Deborah Young, Deborah Young               ACCOUNT NO.:  [id number]   MEDICAL RECORD NO.:  [id number]          PATIENT TYPE:  INP   LOCATION:  5503                         FACILITY:  [facility]   PHYSICIAN:  Richarda Overlie, MD       DATE OF BIRTH:  1945/04/26   DATE OF ADMISSION:  02/25/2008  DATE OF DISCHARGE:  03/11/2008                               DISCHARGE SUMMARY   ADDENDUM   Kindly refer to discharge summary from March 10, 2008 for further  details of hospitalization.   INTERIM DISCHARGE SUMMARY:  The patient was concerned about a decreased  colostomy output yesterday.  An abdominal CT was done that showed some  gaseous distention and was increased from x-ray on March 06, 2008 but  unchanged from the acute abdominal series done on the 13th.  Patient  clinically did not have any symptoms of nausea, vomiting and maintained  her oral intake.  She is to continue with an aggressive constipation  protocol with MiraLAX 3 times a day, to receive scheduled Reglan and  Zofran every 6 hours for at least one week.  She is continue with her  b.i.d. PPI.   FOLLOWUP CONCERNS:  Followup appointment with Dr. Petra Kuba  office has been scheduled for March 21, 2008 at 1:45 p.m.   The patient is to continue with mechanical soft diet as tolerated.  She  is to have a repeat CMP panel, CBC in 7 - 10 days with the results to  her primary care Nino Amano.   Would recommend Imodium or Questran for diarrhea as the patient does  tend to get constipated after such episodes and has a history of chronic  constipation.   Discharge plan was discussed with the patient's sister Shamirah Ivan.   DISCHARGE MEDICATIONS:  1. MiraLAX 8 ounces with fluid t.i.d.  2. Ciprofloxacin 500 mg p.o. q.12 hours for 10 days.  3. Fentanyl patch 12 mcg per hour q.72 hours.  4. Albuterol inhaler 2 puffs q.4 hours.  5. Colace 200 mg p.o. q.12.  6. Senna 2 tablets p.o. nightly.  7. Vitamin B 12,000 mcg intramuscular  injection weekly.  8. Reglan 10 mg p.o. q.6 hours.  9. Zofran 4 mg p.o. q.6 hours.  10.Lidoderm patch 5% q.12 to lower back.  11.Protonix 40 mg p.o. q.12.  12.Maalox 30 mL p.o. q.6 hours p.r.n.  13.Ultram 50 mg p.o. q.6 hours.  14.Aspirin 81 mg p.o. daily.  15.Mucinex ER 600 mg p.o. q.12 p.r.n.  16.Spiriva 18 mcg inhaled.  17.Celexa 30 mg daily.  18.Ambien 5 mg nightly p.r.n.      Richarda Overlie, MD  Electronically Signed     NA/MEDQ  D:  03/11/2008  T:  03/11/2008  Job:  [id number]

## 2010-07-28 NOTE — H&P (Signed)
NAME:  Deborah Young, Deborah Young               ACCOUNT NO.:  [id number]   MEDICAL RECORD NO.:  [id number]          PATIENT TYPE:  INP   LOCATION:  5037                         FACILITY:  [facility]   PHYSICIAN:  Maryla Morrow, MD        DATE OF BIRTH:  [birth year]   DATE OF ADMISSION:  02/25/2008  DATE OF DISCHARGE:                              HISTORY & PHYSICAL   PRIMARY CARE PHYSICIAN:  Unassigned.   CHIEF COMPLAINT:  Nausea and vomiting.   HISTORY OF PRESENT ILLNESS:  Deborah Young is a 65 year old very unfortunate  white female with history of spina bifida status post chronic bedridden  state from the same and currently residing in nursing home.  She also  has history of coronary artery disease, GERD, as well as history of  small bowel obstruction with parastomal hernia with recent exploratory  laparotomy in September 2009 by Dr. Abbey Chatters, who comes in to the [facility] ER with chief complaint of nausea, vomiting without any  bloody stools.  The patient says that she was discharged yesterday from  [facility] where she was being treated for COPD and  pneumonia on both sides.  She apparently got improved from the episode  and was doing well until yesterday when she started having nausea.  This  continued to persist and vomiting around 10 times per day without any  blood in the stools.  The patient described the clear vomitus as green-  to-yellow.  The patient denies any sick contacts.  The patient agrees to  having fever right here in the hospital, but denies any chills.  The  patient does not remember the name of antibiotics she was on at the [facility].  The patient complains of mild abdominal  discomfort; however, denies any chest pain.  The patient also complains  of dry cough.   PAST MEDICAL HISTORY:  Significant for,  1. Non-ambulatory state from spina bifida.  2. Single kidney.  3. Gastroesophageal reflux disease.  4. COPD.  5. Questionable  coronary artery disease with questionable MI in the      past.  6. Status post colostomy as well as urostomy.   PAST SURGICAL HISTORY:  Significant for left lower extremity amputation,  diverting colostomy, and  ileal conduit urostomy.   MEDICATIONS:  The patient does not remember the home medications, so I  reviewed the medical records and received the medications from the same  per discharge summary, November 16, 2007.  Those include:  1. Toprol-XL 25 mg daily.  2. Aspirin 81 mg daily.  3. Diamox 125 mg t.i.d.  4. Mucinex ER 600 mg q.12.  5. Fentanyl transdermal patch 25 mcg q.72 h.  6. Tylenol extended release 650 mg q.8 hourly.  7. Albuterol inhaler as needed.  8. Spiriva 18 mcg daily.  9. Zantac 75 mg tablet q.12 h.  10.Tums chewable 500 mg p.r.n.  11.Celexa 30 mg daily.  12.Ambien 5 mg at bedtime.  13.Percocet 5/325 mg 2 tablets every 4 hours as needed for pain.  14.MiraLax 8 ounces fluid t.i.d.  ALLERGIES:  The patient allergic to PENICILLIN, ERYTHROMYCIN, and  NORFLOXACIN, all of theses causes rash.   FAMILY MEDICAL HISTORY:  Negative for diabetes, coronary artery disease,  or thyroid problem.   REVIEW OF SYSTEMS:  All pertinent positive and negative as noted in the  HPI, rest was negative for patient complete 12-point review of systems  performed.   PHYSICAL EXAMINATION:  VITAL SIGNS:  Temperature of 100.5 with a pulse  80 per minute, respirations 20 per minute, blood pressure is currently  187/92, and saturation 96% on nasal cannula.  GENERAL:  The patient is obese, lies in bed, looks chronically ill in no  acute apparent distress.  HEENT:  Pupils equally round and reactive to light.  Extraocular  movements intact.  Head is atraumatic and normocephalic.  Oropharynx is  clear.  Mucous membranes are moist.  NECK:  No JVD.  No lymphadenopathy.  CHEST:  Auscultation clear bilaterally.  Equal expansion.  HEART:  Regular rate and rhythm.  S1 and S2 normal.  ABDOMEN:   Mildly tender allover with presence of urostomy in the right  and diverting colostomy in the left side.  Bowel sounds are not present.  EXTREMITIES:  Trace nonpitting edema with left lower extremity  amputation.  No cyanosis or clubbing.  Distal pulses are 2+ on the right  lower extremity.  PSYCHIATRIC:  Mood and affect seemed to be normal.  SKIN:  No evidence of rash or ulceration.  Otherwise examination of  abdomen normal.   LABORATORY DATA:  Pertinent labs include a white count elevated at 12.3  with neutrophils 88%, platelet of 384.  Sodium 142, potassium 3.8,  glucose 178, creatinine 0.6, and lipase of 105.  Abdominal series done  here revealed a 9-cm stool volume in the rectum, which seems to be  chronic compared to August 2009, otherwise no evidence of any acute air-  fluid levels.   ASSESSMENT/PLAN:  Ms. Wenzler is a 65 year old female with,  1. Intractable nausea, vomiting with fever.  Considering her extensive      surgical history and recent surgery for parastomal hernia and      adhesiolysis, rule out small bowel obstruction with CT of abdomen      and pelvis.  Other causes including gastroenteritis versus      Clostridium difficile will also be excluded.  I will do that by      checking a CT abdomen and pelvis with contrast as well as stool      studies.  We will place her on IV Flagyl and possibly other agent      for covering the gram negatives.  Also, placed the patient on      antiemetics.  2. Recent chronic obstructive pulmonary disease versus pneumonia with      hospitalization for 7 days at [facility].  At this point,      the patient does not seem to be in chronic obstructive pulmonary      disease exacerbation.  We will thereby continue her inhalation      treatment.  We will also resume her home dose of Spiriva.  3. History of spina bifida.  4. History of gastroesophageal reflux disease.  We will resume proton      pump inhibitor.  5. History of  coronary artery disease.  We will resume aspirin and      beta-blocker.  We will hold off on Diamox at this point.  6. History of small bowel obstruction secondary to  parastomal hernia.  7. History of chronic ileal conduit with single kidney on the right      side.  8. The patient is full code per her wishes.  Her power of attorney is      her brother, but I am not sure whether they are her health part of      attorney as well or not.      Maryla Morrow, MD  Electronically Signed     AP/MEDQ  D:  02/25/2008  T:  02/26/2008  Job:  [id number]

## 2010-07-28 NOTE — Consult Note (Signed)
NAME:  Deborah Young, Deborah Young               ACCOUNT NO.:  [id number]   MEDICAL RECORD NO.:  [id number]          PATIENT TYPE:  INP   LOCATION:  5037                         FACILITY:  [facility]   PHYSICIAN:  Anselm Pancoast. Weatherly, M.D.DATE OF BIRTH:  [birth year]   DATE OF CONSULTATION:  02/26/2008  DATE OF DISCHARGE:                                 CONSULTATION   CONSULTING SURGEON:  Anselm Pancoast. Zachery Dakins, MD   REQUESTING PHYSICIAN:  Eduard Clos, MD   REASON FOR CONSULTATION:  Nausea, vomiting, recent blood per colostomy.   HISTORY OF PRESENT ILLNESS:  Deborah Young is a 65 year old female patient  with a prolonged inpatient hospital stay in September 2009 after repair  of a complex parastomal hernia with mesh with associated small bowel  fecalization, prolonged postop ileus with associated nausea and  vomiting.  The patient was recently hospitalized on inpatient basis at  [facility] until either Friday or Saturday of  this past week for problems related to pneumonia and dehydration.  She  presented to the [facility] ER this Saturday night after nausea and vomiting.  Apparently, before she presented to hospital, she had an episode of  nausea, vomiting, and blood per colostomy but this resolved  spontaneously.  The patient presented because of recurrent episodes of  nausea and vomiting greater than 10 times.  In the ER, plain films and  CT of the abdomen were performed that showed no obvious etiology, no  obstruction, no hernias, no abscess.  The patient has had mild  leukocytosis.  The patient reports decreased colostomy output over the  past few days but has also not eaten over the past few days as well.  Family and the patient request surgery to evaluate the patient to see if  they can delineate the cause and to make sure there is not a surgical  etiology to her symptoms.   REVIEW OF SYSTEMS:  As above.  GI:  The patient was last seen by Dr.  Corliss Skains some time in October  2009 and was doing well from a surgical  standpoint, so was discharged from any additional followup.  CONSTITUTIONAL:  The patient denies any sick exposures, viral symptoms  such as diarrhea, nausea, and vomiting other than as described above and  no fevers or chills.   SOCIAL HISTORY:  The patient is a resident of a skilled nursing facility  because of her chronic medical problems.   FAMILY MEDICAL HISTORY:  Noncontributory.   PAST MEDICAL HISTORY:  1. Spina bifida, bedridden status with lower extremity paralysis.  2. CAD.  3. GERD.  4. Recurrent small bowel obstruction with associated parastomal hernia      as described above.  5. Chronic constipation with chronic fecal ball in the rectum.  6. COPD.   PAST SURGICAL HISTORY:  1. Left lower extremity amputation.  34. A 65 year old diverting colostomy and ileal conduit urostomy.  3. Complex parastomal hernia repair with mesh in September 2009.   ALLERGIES:  PENICILLIN, ERYTHROMYCIN, and LEVAQUIN.   MEDICATIONS AT HOME:  Toprol, aspirin, Diamox, Mucinex, fentanyl patch,  extended-release Tylenol, albuterol  inhalers, Spiriva, Zantac, Tums,  Celexa, Ambien, Percocet, MiraLax, unknown antibiotic at [facility].  While here, the patient has been placed on DVT prophylaxis,  p.r.n. Zofran, aspirin, Protonix, Xopenex, Atrovent, Flagyl IV, and  Invanz IV as well as various other p.r.n. medications.   PHYSICAL EXAMINATION:  GENERAL:  A female patient with flat affect,  complaining of thirst and hunger but apparent persistent nausea.  VITAL SIGNS:  T-max today is 100.9, BP 162/79, pulse 72 and regular,  respirations 16.  NECK:  No JVD.  PSYCH:  The patient is alert and oriented, but her affect is flat.  NEURO:  Cranial nerves II through XII are grossly intact.  She has  chronic lower extremity paralysis secondary to spina bifida.  She moves  upper extremities symmetrically and equally.  Strength is 3-4/5.  EYES:  Noninjected,  nonicteric.  ENT:  Ears are symmetrical.  No otorrhea.  Nose is midline.  No  rhinorrhea.  Oral mucous membranes are dry.  CHEST:  Bilateral lung sounds are clear to auscultation without  wheezing.  She is sating 98% on 2 L.  No tachypnea, nonlabored effort.  CARDIAC:  Heart sounds are S1 and S2 without obvious rubs, murmurs,  thrills, or gallops.  ABDOMEN:  Obese.  She is mildly tender to the left of the umbilicus but  otherwise, abdominal exam is benign.  She has bowel sounds present.  No  pain or wall defects noted around either stoma.  Both stomas are pink.  The ileal conduit has yellow urine to the bag.  The colostomy has light  brown liquid stools to the bag.  The recent midline incision from the  surgery in September is well healed without any open areas or hernias  noted.  EXTREMITIES:  No definite peripheral edema.  Symmetrical in appearance  without cyanosis or clubbing noted.  She does have a left lower  extremity amputation.   LABORATORY DATA:  White count has come down to 11,100 from 12,300,  hemoglobin 11.8, platelets 378,000.  Sodium 140, potassium 3.0, CO2 of  23, glucose 113, BUN 5, creatinine 0.61.  LFTs are normal.   DIAGNOSTIC:  CT and plain abdominal x-rays as noted, again showing no  obvious evidence of obstruction, impaction above the colostomy site, or  hernia, or infection.  The impaction again is a chronic rectal ball,  which has been there since the previous hospitalization.   IMPRESSION:  Nausea and vomiting of uncertain etiology, with associated  leukocytosis.  Possibilities include Clostridium difficile colitis  versus possible ischemic colitis.   PLAN:  Given recent inpatient treatment for pneumonia with antibiotic  therapy, the patient could have developed C. difficile colitis.  Given  the fact she is here with leukocytosis, fever, and abdominal pain, we  will go ahead and check the colostomy output to make sure she does not  have C. difficile  colitis.  She is already on IV Flagyl.  Would prefer  to switch over to p.o. if possible.  Her urinalysis, blood cultures, and  other cultures are negative or not polymicrobial so far.  With recent  blood report of her colostomy, the patient could have had episodic  ischemic colitis.  We will  continue to observe for recurrence of these symptoms.  Consider CTA or  MRA of the abdomen if additional workup in this area is needed.  Next,  we will go ahead and allow clear liquids.  The patient has requested, if  she is unable to  tolerate because of nausea or vomiting, back off to  n.p.o. with ice chips only.      Allison L. Rennis Harding, N.P.    ______________________________  Anselm Pancoast. Zachery Dakins, M.D.    ALE/MEDQ  D:  02/26/2008  T:  02/27/2008  Job:  [id number]

## 2010-07-28 NOTE — Consult Note (Signed)
NAME:  Deborah Young, Deborah Young               ACCOUNT NO.:  [id number]   MEDICAL RECORD NO.:  [id number]          PATIENT TYPE:  INP   LOCATION:  4740                         FACILITY:  [facility]   PHYSICIAN:  Melvyn Novas, M.D.  DATE OF BIRTH:  02-Dec-1945   DATE OF CONSULTATION:  03/01/2008  DATE OF DISCHARGE:                                 CONSULTATION   HISTORY OF PRESENT ILLNESS:  This is a white 65 year old female who was  admitted to [facility] for COPD and bilateral pneumonia  exacerbation.  She was discharged on February 24, 2008.  It was stated  that she was doing well until the evening of February 24, 2008, when the  patient started having nausea and vomiting time x10.  Vomitus was  described as clear to green/yellow.  The patient was then admitted on  February 25, 2008, to [facility].  Workup was done to rule out any obstruction or abscess in the bowel.  On  February 29, 2008, the patient complained of a significant bifrontal  headache.  Neurology was consulted at that time.  On interview, the  patient states that her headache was a sudden onset of headache  bilaterally frontal region with no radiation to the occipital, temporal,  or parietal region.  The patient states that the headache is occurs when  lying down, sitting still, and sitting up.  It is a throbbing in nature  and nothing seems to abate her headache.  She admits to increased  blurred vision with this headache, but no lack of vision.  She states that she has no nuchal rigidity, but is tender throughout.  Denies any Lhermitte's, any paresthesia, paraparesis, or hyperesthesia.  She denies any migraine or recurrent headaches in the past.  Sound and  light do increase her headache.  During interview, it was also noted  that the patient has diffusely in pain.  She complains of leg pain, shoulder pains bilaterally, knee pains  bilaterally, being uncomfortable in the position that she is in, in  addition to her  headache.   PAST MEDICAL HISTORY:  Positive for left below-knee amputation,  urostomy,  peristomal hernia with exploration and laparotomy in September 2009 by  Dr. Abbey Chatters, small bowel obstruction, GERD, coronary artery disease,  spina bifida, and Meniere disease.   MEDICATIONS:  While in the hospital, she is on:  1. Toprol-XL 25 mg daily.  2. Aspirin 81 mg daily.  3. Diamox 125 mg q.8.  4. Fentanyl patch 12 mcg q.72 h.  5. Toradol 30 mg IV q.6.  6. Protonix 40 mg IV q.24.  7. Vancomycin protocol.  8. Reglan p.r.n.  9. Oxycodone p.r.n.  10.Ambien 5 mg nightly p.r.n.  11.Celexa 30 mg daily.   ALLERGIES:  1. PENICILLIN.  2. ERYTHROMYCIN.  3. LEVAQUIN.   SOCIAL HISTORY:  She does not smoke, drink, or do illicit drugs.  She  lives in assisted living.   REVIEW OF SYSTEMS:  15 systems were interrogated, all negative with the  exception of above.   PHYSICAL EXAMINATION:  VITAL SIGNS:  Blood pressure is 180/90, pulse 67,  respiratory rate 18, and temperature 98.3.  NEUROLOGIC:  The patient is alert.  She is oriented x3.  She can carry  out 2-step command.  She is significantly weak.  The patient is slow to  respond to questions.  Depressed affect.  Thought processes are  appropriate at this time.  Sensorium is inappropriate.  Speech is not pressured, rapid, or  inconsistent.    Cranial nerves, pupils are equal, round, and react to light and  accommodating.  The patient has a non-conjugate gaze.  When looking  horizontally to left and right, her eyes tend to stop at midline when  moving medially.  The patient states that this has been a long-term  finding.  The patient does admit to double vision when looking far gazes  and also noted is nystagmus when looking at far visual gaze.  Visual  fields are intact.  Tongue is midline, but edematous.  Uvula is midline.  Speech is not slurred.  Sensation V1-V3 is full.  Shoulder shrug and head turn is weak, but full without  meningismus.  Coordination, finger-to-nose is slow but smooth.  Heel-to-shin was not  tested as she is an amputee.  Fine motor movements are slow with  decreased effort.  Gait was not tested.  Motor, the patient showed very  little effort during physical exam as any and all movements cause  discomfort to her in both upper and lower extremities.  Upper  extremities bilaterally were 4-/5 again with poor effort.  The patient had decreased range of motion in bilateral shoulders with  abduction, forward flexion, internal and external rotation secondary to  pain.  LOWER EXTREMITIES:  The patient's hip flexion was 03/05 bilaterally.  Knee flexion/extension 4-/5.  Her right dorsiflexion, plantarflexion is  0/5, however, this is not a new finding as the patient has spina bifida.  Inversion and eversion of her right foot is also 0/5.  The patient's  muscle tone is decreased throughout and the patient is extremely  deconditioned.  Deep tendon reflexes were 1+ throughout and her right plantarflexion was  equivocal.  Drift, the patient had no drift in upper or lower  extremities.  Sensation was intact throughout to pinprick, light touch, and vibration.  PULMONARY:  Clear to auscultation bilaterally.  CARDIOVASCULAR:  S1 and S2.  Regular rate and rhythm.  No murmurs.  NECK:  Negative for bruits and supple.   LABORATORY DATA:  AST and ALT were normal.  C. diff was negative.  UA  was negative.  Sodium 135, potassium 4.3, chloride 98, CO2 of 31, BUN 5,  creatinine 0.6, and glucose 100.  White blood cell count 10.1,  hemoglobin/hematocrit 13.1 and 40.2, and platelets 298.  It should be  noted that the patient's potassium was 2.7 on admission, now brought up  to 4.3.   IMAGING TEST:  MRI showed white matter changes in the bilateral  occipital region.  She has enlarged ventricles for age with diffuse  atrophy in the frontal region.   ASSESSMENT:  At this time, a 65 year old female with 2-day history  of  sudden onset bilateral frontal headaches.  Differential diagnosis at this time as the patient's blood pressures  have been on the high side of press syndrome in addition to vasculitis  and posterior perfusion stroke.  Recommendation at this time is B12, folate, TSH, ammonia level, amylase,  lipase, ANA, and RPR.  She is placed on steroids at this time to  decrease inflammation in the meninges and would recommend  well-  controlled blood pressure  to keep systolic blood pressure 130 or below.  At this time, we will continue to follow this patient while she is in-  house and all labs are pending.   If you have any questions, please call the physician on call at [phone].     ______________________________  Felicie Morn, PA-C      Melvyn Novas, M.D.  Electronically Signed    DS/MEDQ  D:  03/01/2008  T:  03/01/2008  Job:  [id number]

## 2010-07-28 NOTE — H&P (Signed)
NAME:  Mowatt, Tanasha               ACCOUNT NO.:  [id number]   MEDICAL RECORD NO.:  [id number]          PATIENT TYPE:  INP   LOCATION:  5125                         FACILITY:  [facility]   PHYSICIAN:  Ollen Gross. Vernell Morgans, M.D. DATE OF BIRTH:  [birth year]   DATE OF ADMISSION:  10/16/2007  DATE OF DISCHARGE:                              HISTORY & PHYSICAL   Ms. Hsiung is a 65 year old white female who has a history of colostomy  and ileal conduit almost 50 years for spina bifida.  She is paralyzed in  her lower extremities and is non-ambulatory at this point.  She presents  with nausea and vomiting for the last day or two.  She did have hard  bowel movement come through her ostomy this morning.  She denies any  fever.  Her white count is normal.  She went to the emergency department  where a CT scan was done which showed that she did have a parastomal  hernia.  There was some question of partial obstruction on the CT scan.  It looks as though she has a Advertising account executive with lot of inspissated  material in her rectum, but it does not appear to be connected to the  descending colon.   PAST MEDICAL HISTORY:  Significant for spina bifida, a partial kidney on  one side, gastroesophageal reflux, COPD, coronary artery disease, and  questionable MI in the past.   PAST SURGICAL HISTORY:  Significant for a left lower extremity  amputation, diverting colostomy, and ileal conduit.   MEDICATIONS:  The patient cannot remember her home meds.   ALLERGIES:  Allergies are to PENICILLIN and ERYTHROMYCIN.   SOCIAL HISTORY:  She quits smoking about 3 years ago.  Denies any  alcohol use.   FAMILY HISTORY:  Noncontributory.   PHYSICAL EXAMINATION:  VITAL SIGNS:  Her temp is 99, blood pressure  129/77, and pulse of 97.  GENERAL:  She is a well-develop, well-nourished white female in no acute  distress.  SKIN:  Warm and dry.  No jaundice.  EYES:  Extraocular movements are intact.  Pupils are equal, round, and  reactive to light.  Sclerae nonicteric, although she has a disconjugate  gaze.  LUNGS:  Clear bilaterally with no use of accessory respiratory muscles.  HEART:  Regular rate and rhythm with impulse in the left chest.  ABDOMEN:  Soft, mild tenderness around her ostomy and moderate  distention.  Her ostomy is pink.  Her ileal conduit is pink.  She does  have some mild tenderness around her ostomy.  EXTREMITIES:  She has a left lower extremity amputation and does not  have much sensation below her knees.  PSYCHOLOGIC:  She is alert and oriented x3 with no evidence of anxiety  or depression.   On review of her lab work, again she had normal white count, normal  electrolytes, and normal liver function.  CT scan showed a peristomal  hernia and a question of a partial obstruction.   ASSESSMENT AND PLAN:  This is a 65 year old white female with a history  of abdominal surgery for spina bifida who  has either some partial bowel  obstruction or severe constipation.  We will at this point plan to admit  her for bowel rest and NG tube suctioning.  We will also try a small  enema through her ostomy to irrigate out some of the distal stool.  We  will repeat her lab work and x-rays in the morning.  If she improves  then she may not require a surgery for this and at this point she does  not want any surgery.  If she does not improve over the next day or two,  then she may require a surgery to fix this blockage.  I have discuss  this with her, and she will consider it.  We will admit her for bowel  rest for now and IV hydration.      Ollen Gross. Vernell Morgans, M.D.  Electronically Signed     PST/MEDQ  D:  10/16/2007  T:  10/17/2007  Job:  [id number]

## 2010-07-31 NOTE — Discharge Summary (Signed)
NAME:  Deborah Young, Deborah Young               ACCOUNT NO.:  [id number]   MEDICAL RECORD NO.:  [id number]          PATIENT TYPE:  INP   LOCATION:  6529                         FACILITY:  [facility]   PHYSICIAN:  Elliot Cousin, M.D.    DATE OF BIRTH:  [birth year]   DATE OF ADMISSION:  02/08/2005  DATE OF DISCHARGE:  02/11/2005                                 DISCHARGE SUMMARY   DISCHARGE DIAGNOSES:  1.  Functional palpitations with occasional premature ventricular      contractions.  2.  Dizziness secondary to vertigo and possibly palpitations. The MRI of the      brain ordered during the hospitalization was negative for acute stroke.  3.  Chest pain. Cardiac enzymes were negative. The 2-D echocardiogram was      normal.  4.  Hyperlipidemia.  5.  Chronic lower extremity edema.  6.  Chronic interstitial lung disease per chest x-ray. The patient quit      smoking six weeks ago.   SECONDARY DISCHARGE DIAGNOSES/PAST MEDICAL HISTORY:  Please see the dictated  history and physical.   PROCEDURE PERFORMED:  1.  A 2-D echocardiogram on November28,2006. The results revealed overall      left ventricular systolic function was normal. Ejection fraction ranged      between 55% and 65%. No left ventricular regional wall motion      abnormalities. No significant abnormality of any of the heart valves.  2.  MRI of the brain with and without contrast. The results revealed no      evidence of acute ischemia, nonspecific white mater changes, mild      ventriculomegaly involving the left lateral ventricles. Mild sinusitis      in the ethmoid sinuses. The MRA revealed no gross evidence of occlusion,      stenosis, dissections, or intracranial aneurysms.  3.  Carotid Doppler study. The results revealed no evidence of ICA stenosis.      The vertebral artery flow antegrade.   HISTORY OF PRESENT ILLNESS:  The patient is a 65 year old lady with a past  medical history significant for spina bifida with history of multiple  back  surgeries, left above the ankle amputation, chronic urostomy status post  bladder removal, who presented to the emergency department on  November27,2006 with a chief complaint of chest palpitations accompanied by  chest pressure and dizziness. The chest pressure was rated as a 7 over 10 in  intensity. There was no associated diaphoresis, although she did have some  episodic nausea. The patient did have a recent cough which was dry. No fever  or chills. Given the patient's history of hypertension and hyperlipidemia,  she was admitted for further evaluation and management.   HOSPITAL COURSE:  Problem 1:  FUNCTIONAL PALPITATIONS, DIZZINESS, CHEST  PAIN:  The patient's EKG on admission revealed no ST or T-wave  abnormalities. There was evidence of Q-waves in the inferior leads. A follow-  up EKG revealed no acute changes. For further evaluation, cardiac enzymes  and a chest x-ray were ordered. The chest x-ray revealed no acute disease,  although there were findings consistent with  chronic interstitial lung  disease. The patient was a smoker for two decades, but she stopped six weeks  ago. The initial cardiac markers were all within normal limits. The follow-  up cardiac enzymes were also within normal limits. The TSH was ordered and  found to be within normal limits at 2.8. Her fasting lipid profile revealed  a total cholesterol of 167, triglycerides of 136, HDL of 45, and LDL of 95.  The patient was maintained on Zocor during the hospital course.   The patient had been treated with hydrochlorothiazide and clonidine prior to  hospital admission. The clonidine was discontinued given its propensity to  cause dizziness. The patient was therefore started on Toprol XL at 25  milligrams daily.   For further evaluation of the patient's dizziness, carotid Dopplers and a  MRI/MRA of the brain were ordered. The carotid Doppler study was negative  for ICA stenosis. The MRI of the brain was  negative for stroke or masses.  There was only nonspecific subcortical white mater changes that did not  appear to represent any type of demyelinating syndrome. There was evidence  of mild ventriculomegaly involving the left lateral ventricles; however, it  was felt that this finding was not the cause of the patient's dizziness. In  addition, a vitamin B12 and folate were ordered. Both were within normal  limits. As stated above, the TSH was within normal limits as well.  It was  noted that her dizziness worsened with head movement. The patient probably  has an element of benign positional vertigo. She was started on a trial of  meclizine The meclizine was titrated up to 25 milligrams t.i.d. Valium at 1  milligram t.i.d. was also added to be given concurrently with the meclizine.  Following this regimen, the patient's dizziness subsided substantially.   During one of my examinations, the patient complained of palpitations. On  auscultation, there was an occasional ectopic beat. On review of the  telemetry monitor, there was evidence of  PVCs, which which only occurred  occasionally. On review of the telemetry  during the course of the  hospitalization, there were no significant arrhythmias. The patient was  therefore treated with Toprol XL, meclizine and Valium for her symptoms. The  number of palpitations during hospital course did decrease. The beta  blockade appeared to have been somewhat effective, although her symptoms did  not totally resolve. The Toprol XL may need to be titrated upward or the  patient may benefit from Lopressor at a b.i.d. to t.i.d. dosing. It is  important to note that the patient's 2-D echocardiogram revealed no  significant findings. Her ejection fraction was well preserved.   DISCHARGE MEDICATIONS:  1.  Toprol XL 25 milligrams daily.  2.  Hydrochlorothiazide 25 milligrams take half a tablet to 1 tablet at      bedtime. 3.  Discontinue clonidine.  4.   Potassium chloride 20 mEq half a tablet daily.  5.  Meclizine 25 milligrams t.i.d.  6.  Valium 2 milligrams half a tablet t.i.d.  7.  Zocor 20 milligrams q.h.s.  8.  Miacalcin nasal spray 1 spray alternating nostrils daily.  9.  Aspirin 81 milligrams daily.   DISCHARGE DISPOSITION:  The patient was advised to follow up with Dr.  Susann Givens in 5-7 days. She was discharged home on November30,2006 in improved  and stable condition. At the time of hospital discharge, the urine culture  is pending. The patient had no complaints of dysuria during hospital course.  CK 53, CK-MB 1.0, troponin I less than 0.01. Sodium 139, potassium 4.2,  chloride 109, CO2 25, glucose 81, BUN 14, creatinine 0.8, calcium 8.4.      Elliot Cousin, M.D.  Electronically Signed     DF/MEDQ  D:  02/12/2005  T:  02/13/2005  Job:  [id number]

## 2010-07-31 NOTE — H&P (Signed)
NAME:  Deborah Young, Deborah Young               ACCOUNT NO.:  [id number]   MEDICAL RECORD NO.:  [id number]          PATIENT TYPE:  EMS   LOCATION:  MAJO                         FACILITY:  [facility]   PHYSICIAN:  Hillery Aldo, M.D.   DATE OF BIRTH:  Oct 28, 1945   DATE OF ADMISSION:  02/08/2005  DATE OF DISCHARGE:                                HISTORY & PHYSICAL   PRIMARY CARE PHYSICIAN:  [name], M.D.   CHIEF COMPLAINT:  Chest pressure, palpitations, dizziness.   HISTORY OF PRESENT ILLNESS:  The patient is a 65 year old female who reports  an approximately 12-hour history of intermittent sensation of chest  palpitations accompanied by chest pressure and dizziness. The patient denies  that she has ever had similar symptoms. Denies any accompanying diaphoresis  but has had some episodes of nausea and vomiting associated with these  episodes. Has been intermittently short of breath. Has had a dry cough. No  fever or chills. The patient continues to report chest pressure, rated at 6-  7 out of 10 and it has not been relieved by nitroglycerin. Morphine  temporarily eased off the pressure. She is admitted for further evaluation  and workup.   ALLERGIES:  1.  PENICILLIN causes rash.  2.  ERYTHROMYCIN causes rash.   MEDICATIONS:  1.  Hydrochlorothiazide unsure of doses.  2.  Zocor - unsure of dosage.  3.  Clonidine 0.1 milligrams daily (states that her doctor has wanted her to      take b.i.d. but that she cannot tolerate the higher dose).  4.  Miacalcin nasal spray daily.   PAST MEDICAL HISTORY:  1.  Hypertension.  2.  Spina bifida status post multiple back surgeries in childhood.  3.  Thoracic scoliosis.  4.  Hyperlipidemia.  5.  Osteoporosis.  6.  Colostomy.  7.  Left above the ankle amputation.  8.  Urostomy  9.  Status post cholecystectomy.  10. Status post bladder removal.  11. Tonsillectomy.  12. Skin graft to the right heel area.  13. Paraparesis to the lower extremities.   FAMILY HISTORY:  The patient's mother died at age 80 secondary to breast  cancer. Her father was killed in the [place] War. She has two sisters both  who have had double mastectomies, one secondary to breast cancer and the  other for prophylaxis for breast cancer. One of her sisters also has had an  acute MI.   SOCIAL HISTORY:  The patient is divorced and lives alone. She is independent  of ADL's and uses an electric wheelchair to get around. She quit smoking  approximately 5 weeks ago but prior to this was a 1/2 pack a day smoker. She  is permanently disabled. Denies any alcohol or drug use. Does not have any  children.   REVIEW OF SYSTEMS:  As per HPI. Otherwise no fever, chills, weight changes.  She has had some blurry vision but no dysphagia. Some mild pharyngitis, none  now. No musculoskeletal complaints. No headaches. No change in her colostomy  output.   PHYSICAL EXAM:  VITAL SIGNS:  Temperature 99.6, pulse 85, respirations 20,  blood pressure 109/77, O2 saturation 98% on 2 liters.  GENERAL: Well-developed, well-nourished slightly obese female in no acute  distress.  HEENT: Normocephalic, atraumatic. PERRL. EOMI. The left eye does deviate  laterally which she states is secondary to a lazy eye. Oropharynx is  clear.  NECK: Supple, no thyromegaly, no lymphadenopathy, no jugular venous  distension.  CHEST: Lungs clear to auscultation bilaterally with good air movement.  HEART: Regular rate, rhythm. No murmurs, rubs, or gallops. She does have  some tenderness to palpation over the sternum.  ABDOMEN: Soft, nontender, nondistended with normoactive bowel sounds. She  has an urostomy draining clear yellow urine in the right lower quadrant and  a colostomy with an empty bag in the left lower quadrant.  EXTREMITIES: Left above the ankle amputation. There is trace edema on the  right. No clubbing or cyanosis.  SKIN: Warm and dry. No rashes.  NEUROLOGIC: The patient is alert and oriented  x3. Cranial nerves II-XII are  grossly intact. She has paraparesis to the lower extremities but is able to  flex at the knee joint.   EKG shows sinus tachycardia with left anterior fascicular block. No ST-  segment changes.   CHEST X-RAY:  Shows no active disease. Thoracic scoliosis.   LABORATORY DATA:  PT is 14.9, PTT 30. D-dimer is less than 0.22. Sodium is  133, potassium 3.4, chloride 103, bicarb 24.3, glucose 103, BUN 16,  creatinine 0.8. Hemoglobin 16.3, hematocrit 48.0.  Point of care markers are  negative x3.   ASSESSMENT/PLAN:  1.  Chest pain: This is likely atypical. We will admit the patient to a      telemetry unit and rule out acute coronary syndrome with serial enzymes      and telemetry monitoring along with 12-lead EKG analysis. She may have      costochondritis given the reproducibility of chest pain with deep      palpation on her sternum. The plan of care markers are negative. EKG is      unremarkable. Given her subjective symptoms of palpitations, we will      check a thyroid stimulating hormone level to rule out hyperthyroidism as      a cause of her symptoms. D-dimer screening was negative so pulmonary      embolism has been ruled out. She may have some rebound tachycardia from      treatment with clonidine as well. Given this, I will discontinue      clonidine and start her on a beta blocker. We will also start her on      daily aspirin therapy and check a fasting lipid panel to ensure that her      lipids are adequately controlled. She does have some moderate risks for      coronary disease including positive family history in one sister,      tobacco abuse, hypertension, and hyperlipidemia. We will also check a 2-      D echocardiogram given her history of hypertension.  2.  Hypertension: Would stop Clonidine and start the patient on a beta      blocker and titrate this up. Could consider adding hydrochlorothiazide     if needed but with her low sodium and  low potassium, we will avoid that      for now.  3.  Hyponatremia/hypokalemia: Likely secondary to therapy with      hydrochlorothiazide. We will start the patient on normal saline      hydration and supplement  her with p.o. potassium.  4.  Hyperlipidemia: We will start the patient on 40 of Zocor (the patient is      unsure of what dosage she takes at home). We will clarify this. We will      check a fasting lipid panel and adjust her statin therapy based on those      findings.  5.  Osteoporosis: We will continue the patient's Miacalcin.  6.  Prophylaxis: We will initiate GI and DVT prophylaxis.           ______________________________  Hillery Aldo, M.D.     CR/MEDQ  D:  02/08/2005  T:  02/09/2005  Job:  [id number]   cc:   [name], M.D.  Fax: [phone]

## 2010-12-11 LAB — CBC
HCT: 28.3 — ABNORMAL LOW
HCT: 28.8 — ABNORMAL LOW
HCT: 30.7 — ABNORMAL LOW
HCT: 32.6 — ABNORMAL LOW
HCT: 41.4
HCT: 39.3
Hemoglobin: 10 — ABNORMAL LOW
Hemoglobin: 10.6 — ABNORMAL LOW
Hemoglobin: 13.6
Hemoglobin: 9.4 — ABNORMAL LOW
Hemoglobin: 9.9 — ABNORMAL LOW
MCHC: 32.7
MCHC: 33
MCHC: 33.2
MCHC: 33.3
MCHC: 33.6
MCV: 93.3
MCV: 93.6
MCV: 93.8
MCV: 96.2
MCV: 90.7
Platelets: 245
Platelets: 289
Platelets: 298
RBC: 3.18 — ABNORMAL LOW
RBC: 3.3 — ABNORMAL LOW
RBC: 4.03
RBC: 4.42
RBC: 4.34
RDW: 14.8
RDW: 14.8
RDW: 14.9
RDW: 14.9
RDW: 15.1
RDW: 15.3
WBC: 6.7
WBC: 7.3
WBC: 7.7
WBC: 6.7

## 2010-12-11 LAB — DIFFERENTIAL
Basophils Absolute: 0
Basophils Absolute: 0
Basophils Absolute: 0
Basophils Absolute: 0
Basophils Relative: 0
Basophils Relative: 1
Eosinophils Absolute: 0.4
Eosinophils Absolute: 0.2
Eosinophils Relative: 2
Eosinophils Relative: 9 — ABNORMAL HIGH
Eosinophils Relative: 4
Lymphocytes Relative: 15
Lymphocytes Relative: 21
Lymphocytes Relative: 21
Lymphocytes Relative: 23
Lymphocytes Relative: 25
Lymphocytes Relative: 27
Lymphs Abs: 1.1
Lymphs Abs: 1.4
Lymphs Abs: 1.9
Monocytes Absolute: 0.4
Monocytes Absolute: 0.6
Monocytes Absolute: 0.3
Monocytes Relative: 4
Monocytes Relative: 7
Neutro Abs: 3.9
Neutro Abs: 4.8
Neutro Abs: 4.9
Neutrophils Relative %: 64
Neutrophils Relative %: 66

## 2010-12-11 LAB — COMPREHENSIVE METABOLIC PANEL
ALT: 12
AST: 19
AST: 27
Albumin: 1.9 — ABNORMAL LOW
Albumin: 2 — ABNORMAL LOW
Alkaline Phosphatase: 48
Alkaline Phosphatase: 58
Alkaline Phosphatase: 99
BUN: 20
BUN: 7
BUN: 9
CO2: 24
Chloride: 106
Chloride: 108
Chloride: 107
Creatinine, Ser: 0.55
Creatinine, Ser: 0.62
Creatinine, Ser: 0.9
GFR calc Af Amer: >60
GFR calc non Af Amer: >60
Glucose, Bld: 663
Potassium: 4
Potassium: 5
Potassium: 4.1
Sodium: 143
Total Bilirubin: 0.3
Total Bilirubin: 0.5
Total Bilirubin: 0.5
Total Protein: 4.2 — ABNORMAL LOW
Total Protein: 4.8 — ABNORMAL LOW
Total Protein: 5.1 — ABNORMAL LOW

## 2010-12-11 LAB — URINALYSIS, ROUTINE W REFLEX MICROSCOPIC
Bilirubin Urine: NEGATIVE
Glucose, UA: NEGATIVE
Ketones, ur: NEGATIVE
Specific Gravity, Urine: 1.012
pH: 6.5

## 2010-12-11 LAB — TRIGLYCERIDES
Triglycerides: 253 — ABNORMAL HIGH
Triglycerides: 350 — ABNORMAL HIGH

## 2010-12-11 LAB — GLUCOSE, CAPILLARY
Glucose-Capillary: 129 — ABNORMAL HIGH
Glucose-Capillary: 141 — ABNORMAL HIGH
Glucose-Capillary: 146 — ABNORMAL HIGH
Glucose-Capillary: 185 — ABNORMAL HIGH
Glucose-Capillary: 226 — ABNORMAL HIGH

## 2010-12-11 LAB — BASIC METABOLIC PANEL
BUN: 12
CO2: 24
CO2: 27
CO2: 30
CO2: 31
Calcium: 7.4 — ABNORMAL LOW
Calcium: 8 — ABNORMAL LOW
Calcium: 8.2 — ABNORMAL LOW
Chloride: 104
Chloride: 106
Chloride: 107
Chloride: 109
Creatinine, Ser: 0.5
Creatinine, Ser: 0.51
Creatinine, Ser: 0.67
Creatinine, Ser: 0.8
GFR calc Af Amer: >60
GFR calc Af Amer: >60
GFR calc Af Amer: >60
GFR calc Af Amer: >60
GFR calc Af Amer: >60
GFR calc non Af Amer: >60
GFR calc non Af Amer: >60
Glucose, Bld: 153 — ABNORMAL HIGH
Potassium: 3.9
Potassium: 4.9
Sodium: 138
Sodium: 138
Sodium: 141
Sodium: 142
Sodium: 145

## 2010-12-11 LAB — MAGNESIUM
Magnesium: 1.9
Magnesium: 2.4

## 2010-12-11 LAB — URINE MICROSCOPIC-ADD ON

## 2010-12-11 LAB — PREALBUMIN: Prealbumin: 4.8 — ABNORMAL LOW

## 2010-12-11 LAB — URINE CULTURE: Colony Count: >=100000

## 2010-12-11 LAB — WOUND CULTURE

## 2010-12-11 LAB — PHOSPHORUS
Phosphorus: 3.1
Phosphorus: 4.7 — ABNORMAL HIGH

## 2010-12-11 LAB — CHOLESTEROL, TOTAL: Cholesterol: 94

## 2010-12-11 LAB — PROTIME-INR: Prothrombin Time: 16.9 — ABNORMAL HIGH

## 2010-12-11 LAB — LACTIC ACID, PLASMA: Lactic Acid, Venous: 1.1

## 2010-12-16 LAB — URINE CULTURE: Colony Count: >=100000

## 2010-12-16 LAB — URINALYSIS, ROUTINE W REFLEX MICROSCOPIC
Glucose, UA: NEGATIVE
Protein, ur: NEGATIVE
Specific Gravity, Urine: 1.016
Urobilinogen, UA: 1

## 2010-12-16 LAB — GLUCOSE, CAPILLARY
Glucose-Capillary: 112 — ABNORMAL HIGH
Glucose-Capillary: 112 — ABNORMAL HIGH
Glucose-Capillary: 129 — ABNORMAL HIGH
Glucose-Capillary: 144 — ABNORMAL HIGH
Glucose-Capillary: 164 — ABNORMAL HIGH

## 2010-12-16 LAB — URINE MICROSCOPIC-ADD ON

## 2010-12-18 LAB — LIPASE, BLOOD
Lipase: 23 U/L (ref 11–59)
Lipase: 43 U/L (ref 11–59)

## 2010-12-18 LAB — URINE MICROSCOPIC-ADD ON

## 2010-12-18 LAB — BASIC METABOLIC PANEL
BUN: 10 mg/dL (ref 6–23)
BUN: 3 mg/dL — ABNORMAL LOW (ref 6–23)
BUN: 6 mg/dL (ref 6–23)
BUN: 8 mg/dL (ref 6–23)
CO2: 22 mEq/L (ref 19–32)
CO2: 23 mEq/L (ref 19–32)
CO2: 24 mEq/L (ref 19–32)
CO2: 24 mEq/L (ref 19–32)
CO2: 24 mEq/L (ref 19–32)
CO2: 31 mEq/L (ref 19–32)
Calcium: 8.4 mg/dL (ref 8.4–10.5)
Calcium: 8.5 mg/dL (ref 8.4–10.5)
Calcium: 8.5 mg/dL (ref 8.4–10.5)
Calcium: 8.7 mg/dL (ref 8.4–10.5)
Chloride: 107 mEq/L (ref 96–112)
Chloride: 110 mEq/L (ref 96–112)
Chloride: 112 mEq/L (ref 96–112)
Chloride: 98 mEq/L (ref 96–112)
Chloride: 98 mEq/L (ref 96–112)
Creatinine, Ser: 0.6 mg/dL (ref 0.4–1.2)
Creatinine, Ser: 0.61 mg/dL (ref 0.4–1.2)
Creatinine, Ser: 0.62 mg/dL (ref 0.4–1.2)
Creatinine, Ser: 0.65 mg/dL (ref 0.4–1.2)
Creatinine, Ser: 0.68 mg/dL (ref 0.4–1.2)
GFR calc Af Amer: >60 mL/min (ref >60)
GFR calc Af Amer: >60 mL/min (ref >60)
GFR calc Af Amer: >60 mL/min (ref >60)
GFR calc Af Amer: >60 mL/min (ref >60)
GFR calc non Af Amer: >60 mL/min (ref >60)
GFR calc non Af Amer: >60 mL/min (ref >60)
GFR calc non Af Amer: >60 mL/min (ref >60)
Glucose, Bld: 138 mg/dL — ABNORMAL HIGH (ref 70–99)
Glucose, Bld: 86 mg/dL (ref 70–99)
Glucose, Bld: 90 mg/dL (ref 70–99)
Glucose, Bld: 98 mg/dL (ref 70–99)
Potassium: 3.3 mEq/L — ABNORMAL LOW (ref 3.5–5.1)
Potassium: 4 mEq/L (ref 3.5–5.1)
Potassium: 4 mEq/L (ref 3.5–5.1)
Potassium: 4.3 mEq/L (ref 3.5–5.1)
Sodium: 135 mEq/L (ref 135–145)
Sodium: 139 mEq/L (ref 135–145)
Sodium: 141 mEq/L (ref 135–145)

## 2010-12-18 LAB — MAGNESIUM: Magnesium: 1.9 mg/dL (ref 1.5–2.5)

## 2010-12-18 LAB — SEDIMENTATION RATE: Sed Rate: 8 mm/hr (ref 0–22)

## 2010-12-18 LAB — CBC
HCT: 33.5 % — ABNORMAL LOW (ref 36.0–46.0)
HCT: 37.3 % (ref 36.0–46.0)
HCT: 38.9 % (ref 36.0–46.0)
HCT: 40.5 % (ref 36.0–46.0)
HCT: 43.9 % (ref 36.0–46.0)
HCT: 40.1 % (ref 36.0–46.0)
Hemoglobin: 11.6 g/dL — ABNORMAL LOW (ref 12.0–15.0)
Hemoglobin: 12.2 g/dL (ref 12.0–15.0)
Hemoglobin: 13 g/dL (ref 12.0–15.0)
Hemoglobin: 14.2 g/dL (ref 12.0–15.0)
Hemoglobin: 12.9 g/dL (ref 12.0–15.0)
MCHC: 31.9 g/dL (ref 30.0–36.0)
MCHC: 32.2 g/dL (ref 30.0–36.0)
MCHC: 32.3 g/dL (ref 30.0–36.0)
MCHC: 32.3 g/dL (ref 30.0–36.0)
MCHC: 32.5 g/dL (ref 30.0–36.0)
MCHC: 32.1 g/dL (ref 30.0–36.0)
MCV: 86.4 fL (ref 78.0–100.0)
MCV: 86.6 fL (ref 78.0–100.0)
MCV: 87.1 fL (ref 78.0–100.0)
MCV: 87.7 fL (ref 78.0–100.0)
MCV: 87.8 fL (ref 78.0–100.0)
MCV: 87.9 fL (ref 78.0–100.0)
Platelets: 169 10*3/uL (ref 150–400)
Platelets: 227 10*3/uL (ref 150–400)
Platelets: 340 10*3/uL (ref 150–400)
Platelets: 384 10*3/uL (ref 150–400)
RBC: 4.23 MIL/uL (ref 3.87–5.11)
RBC: 4.29 MIL/uL (ref 3.87–5.11)
RBC: 4.43 MIL/uL (ref 3.87–5.11)
RBC: 4.65 MIL/uL (ref 3.87–5.11)
RDW: 16.6 % — ABNORMAL HIGH (ref 11.5–15.5)
RDW: 16.9 % — ABNORMAL HIGH (ref 11.5–15.5)
RDW: 16.9 % — ABNORMAL HIGH (ref 11.5–15.5)
RDW: 17.1 % — ABNORMAL HIGH (ref 11.5–15.5)
RDW: 17.2 % — ABNORMAL HIGH (ref 11.5–15.5)
RDW: 17.3 % — ABNORMAL HIGH (ref 11.5–15.5)
RDW: 15.7 % — ABNORMAL HIGH (ref 11.5–15.5)
WBC: 10.1 10*3/uL (ref 4.0–10.5)
WBC: 7.7 10*3/uL (ref 4.0–10.5)

## 2010-12-18 LAB — CLOSTRIDIUM DIFFICILE EIA
C difficile Toxins A+B, EIA: NEGATIVE
C difficile Toxins A+B, EIA: NEGATIVE
C difficile Toxins A+B, EIA: NEGATIVE

## 2010-12-18 LAB — DIFFERENTIAL
Basophils Absolute: 0 10*3/uL (ref 0.0–0.1)
Basophils Relative: 1 % (ref 0–1)
Eosinophils Absolute: 0.2 10*3/uL (ref 0.0–0.7)
Eosinophils Relative: 2 % (ref 0–5)
Lymphocytes Relative: 30 % (ref 12–46)
Lymphs Abs: 2 10*3/uL (ref 0.7–4.0)
Lymphs Abs: 1.1 10*3/uL (ref 0.7–4.0)
Monocytes Absolute: 0.4 10*3/uL (ref 0.1–1.0)
Monocytes Absolute: 0.6 10*3/uL (ref 0.1–1.0)
Monocytes Absolute: 0.1 10*3/uL (ref 0.1–1.0)
Monocytes Relative: 4 % (ref 3–12)
Monocytes Relative: 1 % — ABNORMAL LOW (ref 3–12)
Neutro Abs: 10.9 10*3/uL — ABNORMAL HIGH (ref 1.7–7.7)
Neutrophils Relative %: 74 % (ref 43–77)
Neutrophils Relative %: 88 % — ABNORMAL HIGH (ref 43–77)

## 2010-12-18 LAB — URINALYSIS, ROUTINE W REFLEX MICROSCOPIC
Bilirubin Urine: NEGATIVE
Glucose, UA: NEGATIVE mg/dL
Ketones, ur: 15 mg/dL — AB
Ketones, ur: 40 mg/dL — AB
Leukocytes, UA: NEGATIVE
Leukocytes, UA: NEGATIVE
Nitrite: NEGATIVE
Nitrite: NEGATIVE
Protein, ur: 100 mg/dL — AB
Specific Gravity, Urine: 1.013 (ref 1.005–1.030)
Specific Gravity, Urine: 1.014 (ref 1.005–1.030)
Urobilinogen, UA: 0.2 mg/dL (ref 0.0–1.0)
pH: 6.5 (ref 5.0–8.0)
pH: 7 (ref 5.0–8.0)

## 2010-12-18 LAB — POTASSIUM: Potassium: 4 mEq/L (ref 3.5–5.1)

## 2010-12-18 LAB — OLIGOCLONAL BANDS, CSF + SERM
Albumin Index: 13.4 ratio — ABNORMAL HIGH (ref 0.0–9.0)
Albumin, CSF: 41 mg/dL — ABNORMAL HIGH (ref 0–35)
Albumin, Serum(Neph): 3060 mg/dL — ABNORMAL LOW (ref 3500–5200)
IgG Index, CSF: 0.39 ratio (ref 0.28–0.66)
IgG/Albumin Ratio, CSF: 0.1 ratio (ref 0.09–0.25)

## 2010-12-18 LAB — URINE CULTURE
Colony Count: NO GROWTH
Culture: NO GROWTH
Culture: NO GROWTH
Special Requests: NEGATIVE
Special Requests: NEGATIVE

## 2010-12-18 LAB — COMPREHENSIVE METABOLIC PANEL
AST: 17 U/L (ref 0–37)
AST: 39 U/L — ABNORMAL HIGH (ref 0–37)
Albumin: 2.7 g/dL — ABNORMAL LOW (ref 3.5–5.2)
Albumin: 2.8 g/dL — ABNORMAL LOW (ref 3.5–5.2)
Albumin: 4.3 g/dL (ref 3.5–5.2)
Alkaline Phosphatase: 60 U/L (ref 39–117)
Alkaline Phosphatase: 65 U/L (ref 39–117)
Alkaline Phosphatase: 68 U/L (ref 39–117)
BUN: 10 mg/dL (ref 6–23)
BUN: 2 mg/dL — ABNORMAL LOW (ref 6–23)
BUN: 3 mg/dL — ABNORMAL LOW (ref 6–23)
BUN: 9 mg/dL (ref 6–23)
CO2: 24 mEq/L (ref 19–32)
Calcium: 8.3 mg/dL — ABNORMAL LOW (ref 8.4–10.5)
Calcium: 9.6 mg/dL (ref 8.4–10.5)
Chloride: 100 mEq/L (ref 96–112)
Chloride: 106 mEq/L (ref 96–112)
Chloride: 112 mEq/L (ref 96–112)
Creatinine, Ser: 0.58 mg/dL (ref 0.4–1.2)
Creatinine, Ser: 0.64 mg/dL (ref 0.4–1.2)
Creatinine, Ser: 0.78 mg/dL (ref 0.4–1.2)
GFR calc Af Amer: >60 mL/min (ref >60)
GFR calc Af Amer: >60 mL/min (ref >60)
GFR calc non Af Amer: >60 mL/min (ref >60)
GFR calc non Af Amer: >60 mL/min (ref >60)
Glucose, Bld: 142 mg/dL — ABNORMAL HIGH (ref 70–99)
Glucose, Bld: 91 mg/dL (ref 70–99)
Glucose, Bld: 178 mg/dL — ABNORMAL HIGH (ref 70–99)
Potassium: 3.1 mEq/L — ABNORMAL LOW (ref 3.5–5.1)
Potassium: 3.4 mEq/L — ABNORMAL LOW (ref 3.5–5.1)
Potassium: 3.5 mEq/L (ref 3.5–5.1)
Sodium: 142 mEq/L (ref 135–145)
Total Bilirubin: 0.3 mg/dL (ref 0.3–1.2)
Total Bilirubin: 0.5 mg/dL (ref 0.3–1.2)
Total Bilirubin: 0.7 mg/dL (ref 0.3–1.2)
Total Bilirubin: 1.1 mg/dL (ref 0.3–1.2)
Total Protein: 5.2 g/dL — ABNORMAL LOW (ref 6.0–8.3)
Total Protein: 5.9 g/dL — ABNORMAL LOW (ref 6.0–8.3)
Total Protein: 8.2 g/dL (ref 6.0–8.3)

## 2010-12-18 LAB — LIPID PANEL
Cholesterol: 121 mg/dL (ref 0–200)
Cholesterol: 123 mg/dL (ref 0–200)
HDL: 29 mg/dL — ABNORMAL LOW (ref >39)
LDL Cholesterol: 66 mg/dL (ref 0–99)
Triglycerides: 130 mg/dL (ref <150)

## 2010-12-18 LAB — HEPATIC FUNCTION PANEL
AST: 17 U/L (ref 0–37)
Albumin: 3 g/dL — ABNORMAL LOW (ref 3.5–5.2)
Albumin: 3.1 g/dL — ABNORMAL LOW (ref 3.5–5.2)
Alkaline Phosphatase: 66 U/L (ref 39–117)
Indirect Bilirubin: 0.7 mg/dL (ref 0.3–0.9)
Total Bilirubin: 1.3 mg/dL — ABNORMAL HIGH (ref 0.3–1.2)
Total Protein: 6 g/dL (ref 6.0–8.3)

## 2010-12-18 LAB — C-REACTIVE PROTEIN: CRP: 1.6 mg/dL — ABNORMAL HIGH (ref <0.6)

## 2010-12-18 LAB — TSH: TSH: 2.569 u[IU]/mL (ref 0.350–4.500)

## 2010-12-18 LAB — CSF CELL COUNT WITH DIFFERENTIAL: WBC, CSF: 3 /mm3 (ref 0–5)

## 2010-12-18 LAB — PHOSPHORUS: Phosphorus: 3.2 mg/dL (ref 2.3–4.6)

## 2010-12-18 LAB — GIARDIA/CRYPTOSPORIDIUM SCREEN(EIA)
Cryptosporidium Screen (EIA): NEGATIVE
Giardia Screen - EIA: NEGATIVE

## 2010-12-18 LAB — PROTEIN AND GLUCOSE, CSF
Glucose, CSF: 38 mg/dL — ABNORMAL LOW (ref 43–76)
Total  Protein, CSF: 57 mg/dL — ABNORMAL HIGH (ref 15–45)

## 2010-12-18 LAB — FECAL LACTOFERRIN, QUANT: Fecal Lactoferrin: NEGATIVE

## 2010-12-18 LAB — CULTURE, BLOOD (ROUTINE X 2)

## 2010-12-18 LAB — LUPUS ANTICOAGULANT PANEL
DRVVT: 52.4 secs — ABNORMAL HIGH (ref 36.1–47.0)
PTT Lupus Anticoagulant: 54.2 secs — ABNORMAL HIGH (ref 36.3–48.8)
PTTLA 4:1 Mix: 47.9 secs (ref 36.3–48.8)

## 2010-12-18 LAB — HSV PCR: HSV, PCR: NOT DETECTED

## 2010-12-18 LAB — GRAM STAIN

## 2010-12-18 LAB — MPO/PR-3 (ANCA) ANTIBODIES

## 2010-12-18 LAB — RAPID URINE DRUG SCREEN, HOSP PERFORMED
Amphetamines: NOT DETECTED
Benzodiazepines: NOT DETECTED
Cocaine: NOT DETECTED

## 2010-12-18 LAB — HEPARIN ANTI-XA: Heparin LMW: <0.1 IU/mL

## 2014-04-03 ENCOUNTER — Encounter: Payer: Self-pay | Admitting: Internal Medicine

## 2015-01-13 ENCOUNTER — Ambulatory Visit (HOSPITAL_BASED_OUTPATIENT_CLINIC_OR_DEPARTMENT_OTHER): Payer: Self-pay

## 2015-03-28 ENCOUNTER — Encounter (HOSPITAL_BASED_OUTPATIENT_CLINIC_OR_DEPARTMENT_OTHER): Payer: Self-pay | Admitting: *Deleted

## 2015-03-31 ENCOUNTER — Encounter (HOSPITAL_BASED_OUTPATIENT_CLINIC_OR_DEPARTMENT_OTHER): Payer: Self-pay

## 2015-05-22 ENCOUNTER — Encounter (HOSPITAL_BASED_OUTPATIENT_CLINIC_OR_DEPARTMENT_OTHER): Payer: Self-pay

## 2015-07-21 ENCOUNTER — Ambulatory Visit (HOSPITAL_BASED_OUTPATIENT_CLINIC_OR_DEPARTMENT_OTHER): Payer: Medicare Other | Attending: Physician Assistant | Admitting: Internal Medicine

## 2015-07-21 VITALS — Ht 59.0 in | Wt 177.0 lb

## 2015-07-21 DIAGNOSIS — Z6836 Body mass index (BMI) 36.0-36.9, adult: Secondary | ICD-10-CM | POA: Insufficient documentation

## 2015-07-21 DIAGNOSIS — R0683 Snoring: Secondary | ICD-10-CM | POA: Insufficient documentation

## 2015-07-21 DIAGNOSIS — I10 Essential (primary) hypertension: Secondary | ICD-10-CM | POA: Diagnosis not present

## 2015-07-21 DIAGNOSIS — G4710 Hypersomnia, unspecified: Secondary | ICD-10-CM

## 2015-07-21 DIAGNOSIS — R5383 Other fatigue: Secondary | ICD-10-CM | POA: Insufficient documentation

## 2015-07-21 DIAGNOSIS — Z79899 Other long term (current) drug therapy: Secondary | ICD-10-CM | POA: Insufficient documentation

## 2015-07-21 DIAGNOSIS — E669 Obesity, unspecified: Secondary | ICD-10-CM | POA: Diagnosis not present

## 2015-07-21 DIAGNOSIS — J449 Chronic obstructive pulmonary disease, unspecified: Secondary | ICD-10-CM | POA: Insufficient documentation

## 2015-07-21 DIAGNOSIS — G4733 Obstructive sleep apnea (adult) (pediatric): Secondary | ICD-10-CM | POA: Insufficient documentation

## 2015-07-21 DIAGNOSIS — G4719 Other hypersomnia: Secondary | ICD-10-CM | POA: Diagnosis not present

## 2015-07-21 DIAGNOSIS — G4730 Sleep apnea, unspecified: Secondary | ICD-10-CM | POA: Insufficient documentation

## 2015-07-26 DIAGNOSIS — R5383 Other fatigue: Secondary | ICD-10-CM | POA: Diagnosis not present

## 2015-07-26 DIAGNOSIS — G4710 Hypersomnia, unspecified: Secondary | ICD-10-CM | POA: Diagnosis not present

## 2015-07-26 DIAGNOSIS — G4733 Obstructive sleep apnea (adult) (pediatric): Secondary | ICD-10-CM | POA: Diagnosis not present

## 2015-07-26 NOTE — Procedures (Signed)
Patient Name: Deborah Young, Deborah Young Date: 07/21/2015 Gender: Female D.O.B: 05/24/1945 Age (years): 70 Referring Provider: Elijio Miles Height (inches): 52 Interpreting Physician: Baird Lyons MD, ABSM Weight (lbs): 177 RPSGT: Laren Everts BMI: 36 MRN: [id number] Neck Size: 17.00 CLINICAL INFORMATION Sleep Study Type: Split Night CPAP   Indication for sleep study: COPD, Excessive Daytime Sleepiness, Fatigue, Hypertension, Obesity, OSA, Snoring, Witnessed Apneas   Epworth Sleepiness Score:16/24  SLEEP STUDY TECHNIQUE As per the AASM Manual for the Scoring of Sleep and Associated Events v2.3 (April 2016) with a hypopnea requiring 4% desaturations. The channels recorded and monitored were frontal, central and occipital EEG, electrooculogram (EOG), submentalis EMG (chin), nasal and oral airflow, thoracic and abdominal wall motion, anterior tibialis EMG, snore microphone, electrocardiogram, and pulse oximetry. Continuous positive airway pressure (CPAP) was initiated when the patient met split night criteria and was titrated according to treat sleep-disordered breathing.  MEDICATIONS Medications taken by the patient : charted for review Medications administered by patient during sleep study :CALMOSEPTINE, LYRICA, ADVAIR DISKUS  RESPIRATORY PARAMETERS Diagnostic Total AHI (/hr): 79.4 RDI (/hr): 88.5 OA Index (/hr): 7.7 CA Index (/hr): 0.5 REM AHI (/hr): N/A NREM AHI (/hr): 79.4 Supine AHI (/hr): 79.4 Non-supine AHI (/hr): N/A Min O2 Sat (%): 77.00 Mean O2 (%): 85.79 Time below 88% (min): 111.5   Titration Optimal Pressure (cm): 15 AHI at Optimal Pressure (/hr): 1.8 Min O2 at Optimal Pressure (%): 84.0 Supine % at Optimal (%): 100 Sleep % at Optimal (%): 98    SLEEP ARCHITECTURE The recording time for the entire night was 419.7 minutes. During a baseline period of 234.2 minutes, the patient slept for 124.0 minutes in REM and nonREM, yielding a sleep efficiency of 52.9%. Sleep  onset after lights out was 63.5 minutes with a REM latency of N/A minutes. The patient spent 16.94% of the night in stage N1 sleep, 83.06% in stage N2 sleep, 0.00% in stage N3 and 0.00% in REM. During the titration period of 174.5 minutes, the patient slept for 151.0 minutes in REM and nonREM, yielding a sleep efficiency of 86.5%. Sleep onset after CPAP initiation was 6.3 minutes with a REM latency of 49.5 minutes. The patient spent 4.64% of the night in stage N1 sleep, 71.52% in stage N2 sleep, 0.00% in stage N3 and 23.84% in REM.  CARDIAC DATA The 2 lead EKG demonstrated sinus rhythm. The mean heart rate was 84.46 beats per minute. Other EKG findings include: None.  LEG MOVEMENT DATA The total Periodic Limb Movements of Sleep (PLMS) were 0. The PLMS index was 0.00 .  IMPRESSIONS - Severe obstructive sleep apnea occurred during the diagnostic portion of the study (AHI = 79.4/hour). An optimal PAP pressure was selected for this patient ( 15 cm of water) - No significant central sleep apnea occurred during the diagnostic portion of the study (CAI = 0.5/hour). - Severe oxygen desaturation was noted during the diagnostic portion of the study (Min O2 = 77.00%). - Supplemental O2 1L/Min was added at 0042 for low saturation per protocol, before CPAP. - The patient snored with Moderate snoring volume during the diagnostic portion of the study. - No cardiac abnormalities were noted during this study. - Clinically significant periodic limb movements did not occur during sleep.  DIAGNOSIS - Obstructive Sleep Apnea (327.23 [G47.33 ICD-10])  RECOMMENDATIONS - Trial of CPAP therapy on 15 cm H2O with a Small size [employer] Respironics Full Face Mask Amara View mask and heated humidification. - Avoid alcohol, sedatives and other CNS depressants that may  worsen sleep apnea and disrupt normal sleep architecture. - Sleep hygiene should be reviewed to assess factors that may improve sleep quality. - Weight  management and regular exercise should be initiated or continued.  Deneise Lever Diplomate, American Board of Sleep Medicine  ELECTRONICALLY SIGNED ON:  07/26/2015, 3:51 PM Center PH: [phone]   FX: [phone] [place]

## 2016-09-13 ENCOUNTER — Inpatient Hospital Stay (HOSPITAL_COMMUNITY): Payer: Medicare Other

## 2016-09-13 ENCOUNTER — Encounter: Payer: Self-pay | Admitting: Emergency Medicine

## 2016-09-13 ENCOUNTER — Emergency Department (HOSPITAL_COMMUNITY): Payer: Medicare Other

## 2016-09-13 ENCOUNTER — Inpatient Hospital Stay (HOSPITAL_COMMUNITY)
Admission: EM | Admit: 2016-09-13 | Discharge: 2016-09-20 | DRG: 872 | Disposition: A | Discharge status: Skilled Nursing Facility | Payer: Medicare Other | Attending: Internal Medicine | Admitting: Internal Medicine

## 2016-09-13 DIAGNOSIS — K219 Gastro-esophageal reflux disease without esophagitis: Secondary | ICD-10-CM | POA: Diagnosis present

## 2016-09-13 DIAGNOSIS — N201 Calculus of ureter: Secondary | ICD-10-CM

## 2016-09-13 DIAGNOSIS — Z1623 Resistance to quinolones and fluoroquinolones: Secondary | ICD-10-CM | POA: Diagnosis present

## 2016-09-13 DIAGNOSIS — A4151 Sepsis due to Escherichia coli [E. coli]: Principal | ICD-10-CM | POA: Diagnosis present

## 2016-09-13 DIAGNOSIS — Z66 Do not resuscitate: Secondary | ICD-10-CM | POA: Diagnosis present

## 2016-09-13 DIAGNOSIS — F341 Dysthymic disorder: Secondary | ICD-10-CM | POA: Diagnosis present

## 2016-09-13 DIAGNOSIS — K56600 Partial intestinal obstruction, unspecified as to cause: Secondary | ICD-10-CM | POA: Diagnosis present

## 2016-09-13 DIAGNOSIS — Z933 Colostomy status: Secondary | ICD-10-CM | POA: Diagnosis not present

## 2016-09-13 DIAGNOSIS — I2510 Atherosclerotic heart disease of native coronary artery without angina pectoris: Secondary | ICD-10-CM | POA: Diagnosis present

## 2016-09-13 DIAGNOSIS — Z87891 Personal history of nicotine dependence: Secondary | ICD-10-CM | POA: Diagnosis not present

## 2016-09-13 DIAGNOSIS — N132 Hydronephrosis with renal and ureteral calculous obstruction: Secondary | ICD-10-CM

## 2016-09-13 DIAGNOSIS — Z1611 Resistance to penicillins: Secondary | ICD-10-CM | POA: Diagnosis present

## 2016-09-13 DIAGNOSIS — Z6837 Body mass index (BMI) 37.0-37.9, adult: Secondary | ICD-10-CM

## 2016-09-13 DIAGNOSIS — Z9049 Acquired absence of other specified parts of digestive tract: Secondary | ICD-10-CM

## 2016-09-13 DIAGNOSIS — Z906 Acquired absence of other parts of urinary tract: Secondary | ICD-10-CM | POA: Diagnosis not present

## 2016-09-13 DIAGNOSIS — E785 Hyperlipidemia, unspecified: Secondary | ICD-10-CM | POA: Diagnosis present

## 2016-09-13 DIAGNOSIS — R109 Unspecified abdominal pain: Secondary | ICD-10-CM | POA: Diagnosis present

## 2016-09-13 DIAGNOSIS — Z89512 Acquired absence of left leg below knee: Secondary | ICD-10-CM

## 2016-09-13 DIAGNOSIS — N390 Urinary tract infection, site not specified: Secondary | ICD-10-CM | POA: Diagnosis not present

## 2016-09-13 DIAGNOSIS — K435 Parastomal hernia without obstruction or  gangrene: Secondary | ICD-10-CM | POA: Diagnosis present

## 2016-09-13 DIAGNOSIS — Z7982 Long term (current) use of aspirin: Secondary | ICD-10-CM

## 2016-09-13 DIAGNOSIS — I10 Essential (primary) hypertension: Secondary | ICD-10-CM | POA: Diagnosis present

## 2016-09-13 DIAGNOSIS — R7881 Bacteremia: Secondary | ICD-10-CM | POA: Diagnosis not present

## 2016-09-13 DIAGNOSIS — J449 Chronic obstructive pulmonary disease, unspecified: Secondary | ICD-10-CM | POA: Diagnosis present

## 2016-09-13 DIAGNOSIS — Z7951 Long term (current) use of inhaled steroids: Secondary | ICD-10-CM | POA: Diagnosis not present

## 2016-09-13 DIAGNOSIS — K56609 Unspecified intestinal obstruction, unspecified as to partial versus complete obstruction: Secondary | ICD-10-CM | POA: Diagnosis present

## 2016-09-13 DIAGNOSIS — Q059 Spina bifida, unspecified: Secondary | ICD-10-CM

## 2016-09-13 DIAGNOSIS — G4733 Obstructive sleep apnea (adult) (pediatric): Secondary | ICD-10-CM | POA: Diagnosis present

## 2016-09-13 DIAGNOSIS — A419 Sepsis, unspecified organism: Secondary | ICD-10-CM | POA: Diagnosis not present

## 2016-09-13 DIAGNOSIS — N136 Pyonephrosis: Secondary | ICD-10-CM | POA: Diagnosis present

## 2016-09-13 DIAGNOSIS — R0602 Shortness of breath: Secondary | ICD-10-CM

## 2016-09-13 DIAGNOSIS — Z0189 Encounter for other specified special examinations: Secondary | ICD-10-CM

## 2016-09-13 HISTORY — DX: Sleep apnea, unspecified: G47.30

## 2016-09-13 HISTORY — DX: Obesity, unspecified: E66.9

## 2016-09-13 HISTORY — DX: Dysthymic disorder: F34.1

## 2016-09-13 HISTORY — DX: Polyneuropathy, unspecified: G62.9

## 2016-09-13 HISTORY — DX: Constipation, unspecified: K59.00

## 2016-09-13 HISTORY — DX: Muscle weakness (generalized): M62.81

## 2016-09-13 HISTORY — DX: Other secondary scoliosis, site unspecified: M41.50

## 2016-09-13 HISTORY — DX: Gastro-esophageal reflux disease without esophagitis: K21.9

## 2016-09-13 HISTORY — DX: Acquired absence of left ankle: Z89.442

## 2016-09-13 HISTORY — DX: Unspecified mood (affective) disorder: F39

## 2016-09-13 HISTORY — DX: Paralytic syndrome, unspecified: G83.9

## 2016-09-13 HISTORY — DX: Chronic obstructive pulmonary disease, unspecified: J44.9

## 2016-09-13 HISTORY — DX: Atherosclerotic heart disease of native coronary artery without angina pectoris: I25.10

## 2016-09-13 HISTORY — DX: Essential (primary) hypertension: I10

## 2016-09-13 LAB — CBC
HEMATOCRIT: 40.5 % (ref 36.0–46.0)
Hemoglobin: 12.7 g/dL (ref 12.0–15.0)
MCH: 28.9 pg (ref 26.0–34.0)
MCHC: 31.4 g/dL (ref 30.0–36.0)
MCV: 92.3 fL (ref 78.0–100.0)
Platelets: 244 10*3/uL (ref 150–400)
RBC: 4.39 MIL/uL (ref 3.87–5.11)
RDW: 16 % — AB (ref 11.5–15.5)
WBC: 9.2 10*3/uL (ref 4.0–10.5)

## 2016-09-13 LAB — COMPREHENSIVE METABOLIC PANEL
ALBUMIN: 3 g/dL — AB (ref 3.5–5.0)
ALT: 22 U/L (ref 14–54)
AST: 44 U/L — ABNORMAL HIGH (ref 15–41)
Alkaline Phosphatase: 166 U/L — ABNORMAL HIGH (ref 38–126)
Anion gap: 11 (ref 5–15)
BUN: 20 mg/dL (ref 6–20)
CHLORIDE: 104 mmol/L (ref 101–111)
CO2: 24 mmol/L (ref 22–32)
Calcium: 8.7 mg/dL — ABNORMAL LOW (ref 8.9–10.3)
Creatinine, Ser: 0.81 mg/dL (ref 0.44–1.00)
GFR calc Af Amer: >60 mL/min (ref >60)
Glucose, Bld: 90 mg/dL (ref 65–99)
POTASSIUM: 4.4 mmol/L (ref 3.5–5.1)
SODIUM: 139 mmol/L (ref 135–145)
Total Bilirubin: 1.1 mg/dL (ref 0.3–1.2)
Total Protein: 7.4 g/dL (ref 6.5–8.1)

## 2016-09-13 LAB — TSH: TSH: 1.586 u[IU]/mL (ref 0.350–4.500)

## 2016-09-13 LAB — LIPASE, BLOOD: LIPASE: 16 U/L (ref 11–51)

## 2016-09-13 LAB — I-STAT CG4 LACTIC ACID, ED: LACTIC ACID, VENOUS: 0.79 mmol/L (ref 0.5–1.9)

## 2016-09-13 MED ORDER — VENLAFAXINE HCL ER 75 MG PO CP24
75.0000 mg | ORAL_CAPSULE | Freq: Every day | ORAL | Status: DC
  Administered 2016-09-14 – 2016-09-20 (×6): 75 mg via ORAL
  Filled 2016-09-13 (×7): qty 1

## 2016-09-13 MED ORDER — POTASSIUM CL IN DEXTROSE 5% 20 MEQ/L IV SOLN
20.0000 meq | INTRAVENOUS | Status: DC
  Administered 2016-09-13 – 2016-09-17 (×7): 20 meq via INTRAVENOUS
  Filled 2016-09-13 (×8): qty 1000

## 2016-09-13 MED ORDER — LIDOCAINE HCL 2 % EX GEL
1.0000 | Freq: Once | CUTANEOUS | Status: AC
Start: 2016-09-13 — End: 2016-09-13
  Administered 2016-09-13: 1 via TOPICAL
  Filled 2016-09-13: qty 11

## 2016-09-13 MED ORDER — ALBUTEROL SULFATE (2.5 MG/3ML) 0.083% IN NEBU: 2.5000 mg | INHALATION_SOLUTION | RESPIRATORY_TRACT | Status: DC | PRN

## 2016-09-13 MED ORDER — DIATRIZOATE MEGLUMINE & SODIUM 66-10 % PO SOLN
90.0000 mL | Freq: Once | ORAL | Status: AC
  Administered 2016-09-13: 90 mL via NASOGASTRIC
  Filled 2016-09-13: qty 90

## 2016-09-13 MED ORDER — IOPAMIDOL (ISOVUE-300) INJECTION 61%
INTRAVENOUS | Status: AC
  Filled 2016-09-13: qty 100

## 2016-09-13 MED ORDER — MOMETASONE FURO-FORMOTEROL FUM 100-5 MCG/ACT IN AERO
2.0000 | INHALATION_SPRAY | Freq: Two times a day (BID) | RESPIRATORY_TRACT | Status: DC
  Administered 2016-09-13 – 2016-09-20 (×14): 2 via RESPIRATORY_TRACT
  Filled 2016-09-13: qty 8.8

## 2016-09-13 MED ORDER — MORPHINE SULFATE (PF) 2 MG/ML IV SOLN
4.0000 mg | INTRAVENOUS | Status: DC | PRN
  Administered 2016-09-13: 4 mg via INTRAVENOUS
  Filled 2016-09-13: qty 2

## 2016-09-13 MED ORDER — CARBOXYMETHYLCELLULOSE SODIUM 0.5 % OP SOLN: 1.0000 [drp] | Freq: Four times a day (QID) | OPHTHALMIC | Status: DC

## 2016-09-13 MED ORDER — ONDANSETRON HCL 4 MG/2ML IJ SOLN: 4.0000 mg | Freq: Four times a day (QID) | INTRAMUSCULAR | Status: DC | PRN

## 2016-09-13 MED ORDER — METOPROLOL TARTRATE 5 MG/5ML IV SOLN
5.0000 mg | INTRAVENOUS | Status: DC | PRN
  Administered 2016-09-15: 01:00:00 5 mg via INTRAVENOUS
  Filled 2016-09-13: qty 5

## 2016-09-13 MED ORDER — HYDRALAZINE HCL 20 MG/ML IJ SOLN
10.0000 mg | Freq: Four times a day (QID) | INTRAMUSCULAR | Status: DC | PRN
  Administered 2016-09-14: 10 mg via INTRAVENOUS
  Filled 2016-09-13 (×3): qty 0.5

## 2016-09-13 MED ORDER — ARTIFICIAL TEARS OPHTHALMIC OINT
TOPICAL_OINTMENT | Freq: Every day | OPHTHALMIC | Status: DC
  Administered 2016-09-15 – 2016-09-19 (×5): via OPHTHALMIC
  Filled 2016-09-13: qty 3.5

## 2016-09-13 MED ORDER — MORPHINE SULFATE (PF) 4 MG/ML IV SOLN
2.0000 mg | INTRAVENOUS | Status: DC | PRN
Start: 2016-09-13 — End: 2016-09-20
  Administered 2016-09-14 – 2016-09-19 (×8): 2 mg via INTRAVENOUS
  Filled 2016-09-13 (×8): qty 1

## 2016-09-13 MED ORDER — CHLORHEXIDINE GLUCONATE 0.12 % MT SOLN
15.0000 mL | Freq: Two times a day (BID) | OROMUCOSAL | Status: DC
  Administered 2016-09-13 – 2016-09-19 (×13): 15 mL via OROMUCOSAL
  Filled 2016-09-13 (×13): qty 15

## 2016-09-13 MED ORDER — HYPROMELLOSE 0.3 % OP GEL: 1.0000 "application " | Freq: Every day | OPHTHALMIC | Status: DC

## 2016-09-13 MED ORDER — POLYVINYL ALCOHOL 1.4 % OP SOLN
1.0000 [drp] | Freq: Four times a day (QID) | OPHTHALMIC | Status: DC
  Administered 2016-09-13 – 2016-09-20 (×24): 1 [drp] via OPHTHALMIC
  Filled 2016-09-13: qty 15

## 2016-09-13 MED ORDER — VENLAFAXINE HCL ER 150 MG PO CP24
150.0000 mg | ORAL_CAPSULE | Freq: Every day | ORAL | Status: DC
  Administered 2016-09-14 – 2016-09-20 (×7): 150 mg via ORAL
  Filled 2016-09-13 (×7): qty 1

## 2016-09-13 MED ORDER — HEPARIN SODIUM (PORCINE) 5000 UNIT/ML IJ SOLN
5000.0000 [IU] | Freq: Three times a day (TID) | INTRAMUSCULAR | Status: DC
  Administered 2016-09-13: 1250 [IU] via SUBCUTANEOUS
  Filled 2016-09-13: qty 1

## 2016-09-13 MED ORDER — PREGABALIN 75 MG PO CAPS
150.0000 mg | ORAL_CAPSULE | Freq: Two times a day (BID) | ORAL | Status: DC
  Administered 2016-09-14 – 2016-09-20 (×12): 150 mg via ORAL
  Filled 2016-09-13 (×13): qty 2

## 2016-09-13 MED ORDER — ONDANSETRON HCL 4 MG PO TABS: 4.0000 mg | ORAL_TABLET | Freq: Four times a day (QID) | ORAL | Status: DC | PRN

## 2016-09-13 MED ORDER — TIOTROPIUM BROMIDE MONOHYDRATE 18 MCG IN CAPS
18.0000 ug | ORAL_CAPSULE | Freq: Every day | RESPIRATORY_TRACT | Status: DC
  Administered 2016-09-14 – 2016-09-20 (×6): 18 ug via RESPIRATORY_TRACT
  Filled 2016-09-13 (×2): qty 5

## 2016-09-13 MED ORDER — IOPAMIDOL (ISOVUE-300) INJECTION 61%
100.0000 mL | Freq: Once | INTRAVENOUS | Status: AC | PRN
  Administered 2016-09-13: 100 mL via INTRAVENOUS

## 2016-09-13 NOTE — Consult Note (Signed)
Reason for Consult: Left ureteral Stone in Functionally Solitary Kidney, Spina Bifida s/p Cystectomy, Atrophic Right Kidney  Referring Physician: Lala Lund MD  Deborah Young is an 71 y.o. female.   HPI:   1 - Left ureteral Stone in Functionally Solitary Kidney - 32m left proximal ureteral stone with moderate hydro by ER CT 09/2016 on eval malaise and nausea. No additional left sided stones.   2 -  Spina Bifida s/p Cystectomy - s/p cystectomy with conduit diversion as teenager for spina bifida. Baseline Cr 1.0  3 -  Atrophic Right Kidney - nearly completely atrophic right kidney by ER CT 09/2016.   PMH sig for spina bifida, colostomy, urostomy, CAD (not limiting, no blood thinners), lives skilled nursing at baseline, sister very involved.  Today "Deborah Young is seen in consultation for above.    Past Medical History:  Diagnosis Date  . Acquired absence of ankle, left ([place]   . Atherosclerotic heart disease of native coronary artery without angina pectoris   . Constipation   . COPD (chronic obstructive pulmonary disease) ([place]   . Dysthymic disorder   . Generalized muscle weakness   . GERD (gastroesophageal reflux disease)   . Hypertension   . Mood disorder ([place]   . Obesity   . Paralytic syndrome ([place]   . Polyneuropathy   . Secondary scoliosis   . Sleep apnea     Past Surgical History:  Procedure Laterality Date  . COLON SURGERY    . COLOSTOMY    . LEFT ANKLE AMPUTATION    . ROBOT ASSISTED LAPAROSCOPIC COMPLETE CYSTECT ILEAL CONDUIT      History reviewed. No pertinent family history.  Social History:  reports that she has quit smoking. Her smoking use included Cigarettes. She has never used smokeless tobacco. She reports that she does not drink alcohol or use drugs.  Allergies:  Allergies  Allergen Reactions  . Codeine   . Erythromycin Other (See Comments)    Per MAR  . Penicillins Other (See Comments)    Per MAR  Has patient had a PCN reaction causing  immediate rash, facial/tongue/throat swelling, SOB or lightheadedness with hypotension: Unknown Has patient had a PCN reaction causing severe rash involving mucus membranes or skin necrosis: Unknown Has patient had a PCN reaction that required hospitalization: Unknown Has patient had a PCN reaction occurring within the last 10 years: Unknown If all of the above answers are "NO", then may proceed with Cephalosporin use.    Medications: I have reviewed the patient's current medications.  Results for orders placed or performed during the hospital encounter of 09/13/16 (from the past 48 hour(s))  Lipase, blood     Status: None   Collection Time: 09/13/16  1:26 PM  Result Value Ref Range   Lipase 16 11 - 51 U/L  Comprehensive metabolic panel     Status: Abnormal   Collection Time: 09/13/16  1:26 PM  Result Value Ref Range   Sodium 139 135 - 145 mmol/L   Potassium 4.4 3.5 - 5.1 mmol/L   Chloride 104 101 - 111 mmol/L   CO2 24 22 - 32 mmol/L   Glucose, Bld 90 65 - 99 mg/dL   BUN 20 6 - 20 mg/dL   Creatinine, Ser 0.81 0.44 - 1.00 mg/dL   Calcium 8.7 (L) 8.9 - 10.3 mg/dL   Total Protein 7.4 6.5 - 8.1 g/dL   Albumin 3.0 (L) 3.5 - 5.0 g/dL   AST 44 (H) 15 - 41 U/L  ALT 22 14 - 54 U/L   Alkaline Phosphatase 166 (H) 38 - 126 U/L   Total Bilirubin 1.1 0.3 - 1.2 mg/dL   GFR calc non Af Amer >60 >60 mL/min   GFR calc Af Amer >60 >60 mL/min    Comment: (NOTE) The eGFR has been calculated using the CKD EPI equation. This calculation has not been validated in all clinical situations. eGFR's persistently <60 mL/min signify possible Chronic Kidney Disease.    Anion gap 11 5 - 15  CBC     Status: Abnormal   Collection Time: 09/13/16  1:26 PM  Result Value Ref Range   WBC 9.2 4.0 - 10.5 K/uL   RBC 4.39 3.87 - 5.11 MIL/uL   Hemoglobin 12.7 12.0 - 15.0 g/dL   HCT 40.5 36.0 - 46.0 %   MCV 92.3 78.0 - 100.0 fL   MCH 28.9 26.0 - 34.0 pg   MCHC 31.4 30.0 - 36.0 g/dL   RDW 16.0 (H) 11.5 - 15.5 %    Platelets 244 150 - 400 K/uL  I-Stat CG4 Lactic Acid, ED     Status: None   Collection Time: 09/13/16  4:57 PM  Result Value Ref Range   Lactic Acid, Venous 0.79 0.5 - 1.9 mmol/L    Ct Abdomen Pelvis W Contrast  Result Date: 09/13/2016 CLINICAL DATA:  Abdominal pain in various locations x5 days with nausea and constipation. Last bowel movement 6 days ago. No emesis. Soft nontender focus lateral to urostomy bag in the right lower quadrant. Small nontender mass right periumbilical region as well. EXAM: CT ABDOMEN AND PELVIS WITH CONTRAST TECHNIQUE: Multidetector CT imaging of the abdomen and pelvis was performed using the standard protocol following bolus administration of intravenous contrast. CONTRAST:  174m ISOVUE-300 IOPAMIDOL (ISOVUE-300) INJECTION 61% COMPARISON:  02/26/2009 FINDINGS: Lower chest: Bibasilar subsegmental atelectasis and/or scarring. Included heart appears normal in size without pericardial effusion. Hepatobiliary: Hepatic steatosis. Stable hypodensity along the falciform ligament may reflect fatty infiltration. Status post cholecystectomy. No biliary dilatation. Pancreas: Unremarkable. No pancreatic ductal dilatation or surrounding inflammatory changes. Spleen: Normal in size without focal abnormality. Adrenals/Urinary Tract: There is an 8 x 8 x 9 mm left UPJ stone causing moderate left-sided hydronephrosis and perinephric fat stranding. Atrophic right kidney with 8 mm nonobstructing calculus. Diverting right lower quadrant urostomy with parastomal hernia containing a portion adjacent ascending colon as well as omental fat. Stomach/Bowel: Physiologically distended stomach. Normal small bowel rotation. Left lateral hernia is again noted containing fluid-filled distended jejunal loops measuring up to 4.5 cm in caliber as well as portions of nondistended descending colon containing stool. There appears to be a right lower quadrant transition point, series 2, image 69 contributing to a  partial high-grade or early small bowel obstruction. Desiccated stool noted in the rectosigmoid. Vascular/Lymphatic: Aortic atherosclerosis without aneurysm or dissection. No lymphadenopathy by CT size criteria. Reproductive: Uterus and bilateral adnexa are unremarkable. Other: There is a 5 x 5 x 2.2 cm fat containing right periumbilical ventral hernia. Musculoskeletal: Dextroconvex curvature of the lower thoracic spine. Lower lumbar facet arthropathy. No acute osseous appearing abnormality. 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 IMPRESSION: 1. 8 x 8 x 9 mm left UPJ stone causing moderate left-sided hydronephrosis. Atrophic native right kidney with nonobstructing renal calculus. 2. Early or high grade partial SBO secondary to right lower quadrant transition point possibly from an adhesion or short segmental stricture involving distal small intestine, series 2, image 69. 3.  Right lower quadrant urostomy with new parastomal fat and large bowel containing hernia without evidence of incarceration. This may account for the reported soft tissue distention lateral to the urostomy. 4. Small fat containing right periumbilical ventral hernia. 5. Known left lateral small and large bowel containing hernia. 6. 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 7. Hepatic steatosis. 8. Cholecystectomy. Electronically Signed   By: Ashley Royalty M.D.   On: 09/13/2016 15:17   Dg Chest Port 1 View  Result Date: 09/13/2016 CLINICAL DATA:  Shortness of breath. EXAM: PORTABLE CHEST 1 VIEW COMPARISON:  01/26/2016 and prior exam FINDINGS: Upper limits normal heart size again noted. Patchy right lower lung opacities are noted and may represent airspace disease/ pneumonia. Mild left basilar scarring/ atelectasis is unchanged. There is no evidence of pleural effusion or pneumothorax. A severe thoracic scoliosis is again noted. IMPRESSION:  Patchy right lower lung opacities -question pneumonia. No other acute abnormality. Electronically Signed   By: Margarette Canada M.D.   On: 09/13/2016 16:50    Review of Systems  Constitutional: Positive for malaise/fatigue.  HENT: Negative.   Eyes: Negative.   Respiratory: Negative.   Cardiovascular: Negative.   Gastrointestinal: Positive for nausea.  Genitourinary: Negative.   Musculoskeletal: Negative.   Skin: Negative.   Neurological: Negative.   Endo/Heme/Allergies: Negative.    Blood pressure (!) 157/78, pulse 81, temperature 98.5 F (36.9 C), temperature source Oral, resp. rate 18, SpO2 93 %. Physical Exam  Constitutional: She is oriented to person, place, and time.  Stigmata of spina bifida. Sister at bedside.  HENT:  Head: Normocephalic.  Eyes: Pupils are equal, round, and reactive to light.  Cardiovascular: Normal rate.   Respiratory: Effort normal.  GI:  Large truncal obesity with multiple scars. RLQ Urostomy with yellow uriine. LLQ Colostomy that appears patent of stool   Genitourinary:  Genitourinary Comments: No sig CVAT at present.   Neurological: She is alert and oriented to person, place, and time.  Skin: Skin is warm.  Psychiatric: She has a normal mood and affect. Her behavior is normal.    Assessment/Plan:  1 - Left ureteral Stone in Functionally Solitary Kidney - althogh Cr normal, she has impending obstrucion of her functionally solitary kidney. Rec left neph tube tomorrow. She will need antegrade ureteroscopy / PCNL in elective settting for this after over acute SBO.   I have ordered left neph tube for tomorrow. Pt is NPO, no blood thinners. I have spoken with IT techs abotu this.   2 -  Spina Bifida s/p Cystectomy - GFR preserved.   3 -  Atrophic Right Kidney - no indications for removal, observe.     Brenetta Penny 09/13/2016, 5:23 PM

## 2016-09-13 NOTE — ED Triage Notes (Addendum)
[facility] in variable locations x 5 days, nausea, constipation, last BM 6 days ago. No emesis. Unable to tolerate solid food due to worsening of pain with food. Hx of bowel obstruction requiring colostomy, which is still in place.  X-ray from out of facility reads "mild colonic ileus." Abdomen distended, pt reports more firm than usual, asymmetrically distended and firm with more distention to right abdomen lateral to colostomy. Soft tender distention lateral to urostomy bag on RLQ. 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

## 2016-09-13 NOTE — ED Notes (Signed)
Bed: WA24 Expected date:  Expected time:  Means of arrival:  Comments: Hall [facility] 

## 2016-09-13 NOTE — Progress Notes (Signed)
Pt. wears CPAP@ home with nasal pillows although currently has NG tube placed in R nare due to SBO, this RT placed 2 lpm humidified n/c on pt. had room air sats of 90% which >'d to 93% with 2 lpm n/c, RT to monitor.

## 2016-09-13 NOTE — H&P (Signed)
TRH H&P   Patient Demographics:    Deborah Young, is a 71 y.o. female  MRN: [id number]   DOB - 12-01-1945  Admit Date - 09/13/2016  Outpatient Primary MD for the patient is Nadara Eaton, MD    Patient coming from: SNF  Chief Complaint  Patient presents with  . Abdominal Pain  . Constipation      HPI:    Deborah Young  is a 71 y.o. female,With history of spina bifida, left BKA, right foot multiple toe amputation, diverting colostomy and urostomy at the age of 86, GERD, obesity, COPD, peripheral neuropathy, history of small bowel obstruction requiring surgical correction 7-8 yrs ago, sleep apnea, who lives at a SNF and is brought in for 5-6 day history of gradually progressive abdominal pain, nausea and lack of stool and flutters in her colostomy bag. Symptoms persisted and were getting gradually worse to the point that she came to the ER where workup revealed small bowel obstruction and a incidental finding of left UPJ stone. General surgery was consulted and I was requested to admit the patient.  She currently has no fever or chills or headache, no chest pain, phlegm or shortness of breath, and does have dull constant generalized nonradiating abdominal pain associated with nausea, lack of bowel movements of Lantus in the ostomy bag, no aggravating or relieving factors, she has had poor appetite for 5-6 days, she has had nausea but no emesis, no other subjective complaints.    Review of systems:    In addition to the HPI above,  No Fever-chills, No Headache, No changes with Vision or hearing, No problems swallowing food or Liquids, No Chest pain, Cough or Shortness of Breath, GI symptoms as above, No Blood in  stool or Urine, No dysuria, No new skin rashes or bruises, No new joints pains-aches,  No new weakness, tingling, numbness in any extremity, No recent weight gain or loss, No polyuria, polydypsia or polyphagia, No significant Mental Stressors.  A full 10 point Review of Systems was done, except as stated above, all other Review of Systems were negative.   With Past History of the following :    Past Medical History:  Diagnosis Date  . Acquired absence of ankle, left (HCC)   . Atherosclerotic heart disease of native coronary artery without angina pectoris   .  Constipation   . COPD (chronic obstructive pulmonary disease) (HCC)   . Dysthymic disorder   . Generalized muscle weakness   . GERD (gastroesophageal reflux disease)   . Hypertension   . Mood disorder (HCC)   . Obesity   . Paralytic syndrome (HCC)   . Polyneuropathy   . Secondary scoliosis   . Sleep apnea       Past Surgical History:  Procedure Laterality Date  . COLON SURGERY    . COLOSTOMY    . LEFT ANKLE AMPUTATION    . ROBOT ASSISTED LAPAROSCOPIC COMPLETE CYSTECT ILEAL CONDUIT        Social History:     Social History  Substance Use Topics  . Smoking status: Former Smoker    Types: Cigarettes  . Smokeless tobacco: Never Used  . Alcohol use No       Family History :   No family history of diabetes mellitus   Home Medications:   Prior to Admission medications   Medication Sig Start Date End Date Taking? Authorizing Provider  aspirin EC 81 MG tablet Take 81 mg by mouth daily.   Yes [provider]  budesonide-formoterol (SYMBICORT) 80-4.5 MCG/ACT inhaler Inhale 2 puffs into the lungs 2 (two) times daily.   Yes [provider]  carboxymethylcellulose (REFRESH PLUS) 0.5 % SOLN 1 drop 4 (four) times daily.   Yes [provider]  chlorhexidine (PERIDEX) 0.12 % solution Use as directed 15 mLs in the mouth or throat 2 (two) times daily.   Yes [provider]    HYDROcodone-acetaminophen (NORCO) 7.5-325 MG tablet Take 1 tablet by mouth 3 (three) times daily.   Yes [provider]  HYDROcodone-acetaminophen (NORCO) 7.5-325 MG tablet Take 1 tablet by mouth 2 (two) times daily as needed for moderate pain.   Yes [provider]  hypromellose (SYSTANE OVERNIGHT THERAPY) 0.3 % GEL ophthalmic ointment Place 1 application into both eyes at bedtime.   Yes [provider]  lamoTRIgine (LAMICTAL) 100 MG tablet Take 100 mg by mouth daily.   Yes [provider]  pregabalin (LYRICA) 100 MG capsule Take 100 mg by mouth 2 (two) times daily.   Yes [provider]  pregabalin (LYRICA) 150 MG capsule Take 150 mg by mouth 2 (two) times daily.   Yes [provider]  primidone (MYSOLINE) 50 MG tablet Take 50 mg by mouth 2 (two) times daily.   Yes [provider]  tiotropium (SPIRIVA) 18 MCG inhalation capsule Place 18 mcg into inhaler and inhale daily.   Yes [provider]  venlafaxine XR (EFFEXOR-XR) 150 MG 24 hr capsule Take 150 mg by mouth daily with breakfast.   Yes [provider]  venlafaxine XR (EFFEXOR-XR) 75 MG 24 hr capsule Take 75 mg by mouth daily with breakfast.   Yes [provider]  Vitamin D, Ergocalciferol, (DRISDOL) 50000 units CAPS capsule Take 50,000 Units by mouth every 7 (seven) days.   Yes [provider]     Allergies:     Allergies  Allergen Reactions  . Codeine   . Erythromycin Other (See Comments)    Per MAR  . Penicillins Other (See Comments)    Per MAR  Has patient had a PCN reaction causing immediate rash, facial/tongue/throat swelling, SOB or lightheadedness with hypotension: Unknown Has patient had a PCN reaction causing severe rash involving mucus membranes or skin necrosis: Unknown Has patient had a PCN reaction that required hospitalization: Unknown Has patient had a PCN reaction  occurring within the last 10 years: Unknown If all of  the above answers are "NO", then may proceed with Cephalosporin use.     Physical Exam:   Vitals  Blood pressure 129/66, pulse 77, temperature 98.5 F (36.9 C), temperature source Oral, resp. rate 18, SpO2 94 %.   1. General Pleasant elderly obese white female lying in hospital bed in no distress,  2. Normal affect and insight, Not Suicidal or Homicidal, Awake Alert, Oriented X 3.  3. No F.N deficits, ALL C.Nerves Intact, Strength 5/5 all 4 extremities, Sensation intact all 4 extremities, Plantars down going.  4. Ears and Eyes appear Normal, Conjunctivae clear, PERRLA. Moist Oral Mucosa.  5. Supple Neck, No JVD, No cervical lymphadenopathy appriciated, No Carotid Bruits.  6. Symmetrical Chest wall movement, Good air movement bilaterally, CTAB.  7. RRR, No Gallops, Rubs or Murmurs, No Parasternal Heave.  8. Hypoactive Bowel Sounds, abdomen is distended, colostomy bag is empty, urostomy bag has urine, No organomegaly appriciated,No rebound -guarding or rigidity.  9.  No Cyanosis, Normal Skin Turgor, No Skin Rash or Bruise.  10. Good muscle tone,  joints appear normal , no effusions, Normal ROM. Left BKA, right foot multiple toe amputations,  11. No Palpable Lymph Nodes in Neck or Axillae      Data Review:    CBC  Recent Labs Lab 09/13/16 1326  WBC 9.2  HGB 12.7  HCT 40.5  PLT 244  MCV 92.3  MCH 28.9  MCHC 31.4  RDW 16.0*   ------------------------------------------------------------------------------------------------------------------  Chemistries   Recent Labs Lab 09/13/16 1326  NA 139  K 4.4  CL 104  CO2 24  GLUCOSE 90  BUN 20  CREATININE 0.81  CALCIUM 8.7*  AST 44*  ALT 22  ALKPHOS 166*  BILITOT 1.1   ------------------------------------------------------------------------------------------------------------------ CrCl cannot be calculated (Unknown ideal  weight.). ------------------------------------------------------------------------------------------------------------------ No results for input(s): TSH, T4TOTAL, T3FREE, THYROIDAB in the last 72 hours.  Invalid input(s): FREET3  Coagulation profile No results for input(s): INR, PROTIME in the last 168 hours. ------------------------------------------------------------------------------------------------------------------- No results for input(s): DDIMER in the last 72 hours. -------------------------------------------------------------------------------------------------------------------  Cardiac Enzymes No results for input(s): CKMB, TROPONINI, MYOGLOBIN in the last 168 hours.  Invalid input(s): CK ------------------------------------------------------------------------------------------------------------------ No results found for: BNP   ---------------------------------------------------------------------------------------------------------------  Urinalysis    Component Value Date/Time   COLORURINE YELLOW 02/27/2008 1331   APPEARANCEUR HAZY (A) 02/27/2008 1331   LABSPEC 1.013 02/27/2008 1331   PHURINE 6.5 02/27/2008 1331   GLUCOSEU NEGATIVE 02/27/2008 1331   HGBUR MODERATE (A) 02/27/2008 1331   BILIRUBINUR NEGATIVE 02/27/2008 1331   KETONESUR 15 (A) 02/27/2008 1331   PROTEINUR NEGATIVE 02/27/2008 1331   UROBILINOGEN 0.2 02/27/2008 1331   NITRITE NEGATIVE 02/27/2008 1331   LEUKOCYTESUR NEGATIVE 02/27/2008 1331    ----------------------------------------------------------------------------------------------------------------   Imaging Results:    Ct Abdomen Pelvis W Contrast  Result Date: 09/13/2016 CLINICAL DATA:  Abdominal pain in various locations x5 days with nausea and constipation. Last bowel movement 6 days ago. No emesis. Soft nontender focus lateral to urostomy bag in the right lower quadrant. Small nontender mass right periumbilical region as well. EXAM: CT  ABDOMEN AND PELVIS WITH CONTRAST TECHNIQUE: Multidetector CT imaging of the abdomen and pelvis was performed using the standard protocol following bolus administration of intravenous contrast. CONTRAST:  100mL ISOVUE-300 IOPAMIDOL (ISOVUE-300) INJECTION 61% COMPARISON:  02/26/2009 FINDINGS: Lower chest: Bibasilar subsegmental atelectasis and/or scarring. Included heart appears normal in size without pericardial effusion. Hepatobiliary: Hepatic steatosis. Stable hypodensity along the falciform  ligament may reflect fatty infiltration. Status post cholecystectomy. No biliary dilatation. Pancreas: Unremarkable. No pancreatic ductal dilatation or surrounding inflammatory changes. Spleen: Normal in size without focal abnormality. Adrenals/Urinary Tract: There is an 8 x 8 x 9 mm left UPJ stone causing moderate left-sided hydronephrosis and perinephric fat stranding. Atrophic right kidney with 8 mm nonobstructing calculus. Diverting right lower quadrant urostomy with parastomal hernia containing a portion adjacent ascending colon as well as omental fat. Stomach/Bowel: Physiologically distended stomach. Normal small bowel rotation. Left lateral hernia is again noted containing fluid-filled distended jejunal loops measuring up to 4.5 cm in caliber as well as portions of nondistended descending colon containing stool. There appears to be a right lower quadrant transition point, series 2, image 69 contributing to a partial high-grade or early small bowel obstruction. Desiccated stool noted in the rectosigmoid. Vascular/Lymphatic: Aortic atherosclerosis without aneurysm or dissection. No lymphadenopathy by CT size criteria. Reproductive: Uterus and bilateral adnexa are unremarkable. Other: There is a 5 x 5 x 2.2 cm fat containing right periumbilical ventral hernia. Musculoskeletal: Dextroconvex curvature of the lower thoracic spine. Lower lumbar facet arthropathy. No acute osseous appearing abnormality. Developmental dysplasia  of the left hip with remodeled appearance of the left hip joint. IMPRESSION: 1. 8 x 8 x 9 mm left UPJ stone causing moderate left-sided hydronephrosis. Atrophic native right kidney with nonobstructing renal calculus. 2. Early or high grade partial SBO secondary to right lower quadrant transition point possibly from an adhesion or short segmental stricture involving distal small intestine, series 2, image 69. 3. Right lower quadrant urostomy with new parastomal fat and large bowel containing hernia without evidence of incarceration. This may account for the reported soft tissue distention lateral to the urostomy. 4. Small fat containing right periumbilical ventral hernia. 5. Known left lateral small and large bowel containing hernia. 6. 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 7. Hepatic steatosis. 8. Cholecystectomy. Electronically Signed   By: Tollie Eth M.D.   On: 09/13/2016 15:17    Baseline chest x-ray and EKG ordered   Assessment & Plan:      1.Small bowel obstruction. With history of similar problems 7-8 years ago requiring surgery, she will be admitted to MedSurg bed, nothing by mouth except essential medications with sip of water, IV fluids, general surgery consulted, she is mildly nauseated but no emesis so far will defer NG placement to general surgery. Continue nausea and pain control.  2. Incidental left UPJ stone with hydronephrosis. No fever or leukocytosis, creatinine stable, hydrate, urology called.  3. Morbid obesity with obstructive sleep apnea. Follow with PCP for weight loss, CPAP daily at bedtime.  4. Spina bifida, left BKA, right multiple toe amputations in the past. Supportive care, PT once better, back to SNF once medically stable.  5. COPD. Stable no wheezing, nebulizer treatments and oxygen as needed.  6. Depression. Home medications continued as tolerated orally.   DVT Prophylaxis Heparin    AM Labs  Ordered, also please review Full Orders  Family Communication: Admission, patients condition and plan of care including tests being ordered have been discussed with the patient and sister who indicate understanding and agree with the plan and Code Status.  Code Status DNR  Likely DC to  SNF  Condition GUARDED    Consults called: [facility], Urology    Admission status: Inpt    Time spent in minutes : 35   Susa Raring M.D on 09/13/2016 at 4:18 PM  Between 7am  to 7pm - Pager - [phone] ( page via [facility], text pages only, please mention full 10 digit call back number).  After 7pm go to [email or web address] - password [facility]  [facility] - Office  [phone]

## 2016-09-13 NOTE — ED Provider Notes (Addendum)
[facility]-EMERGENCY DEPT Provider Note   CSN: [id number] Arrival date & time: 09/13/16  1222     History   Chief Complaint Chief Complaint  Patient presents with  . Abdominal Pain  . Constipation    HPI Deborah Young is a 71 y.o. female.  HPI  Pt comes in with cc of abd pain and distention. Pt has hx of spina bifida, status post cystectomy and conduit diversion, left BKA, right foot multiple toe amputations, diverging colostomy and urostomy, COPD, peripheral neuropathy, history of small bowel structures in the past requiring surgical intervention. Pt presents with gradually progressive abdominal pain for last 5-6 days with associated nausea, bloating and minimal stool output. Pt thinks she is not passing flatus either. Abd pain is now constant and generalized.  Past Medical History:  Diagnosis Date  . Acquired absence of ankle, left (HCC)   . Atherosclerotic heart disease of native coronary artery without angina pectoris   . Constipation   . COPD (chronic obstructive pulmonary disease) (HCC)   . Dysthymic disorder   . Generalized muscle weakness   . GERD (gastroesophageal reflux disease)   . Hypertension   . Mood disorder (HCC)   . Obesity   . Paralytic syndrome (HCC)   . Polyneuropathy   . Secondary scoliosis   . Sleep apnea     Patient Active Problem List   Diagnosis Date Noted  . SBO (small bowel obstruction) (HCC) 09/13/2016  . DYSLIPIDEMIA 01/20/2010  . Essential hypertension, benign 01/20/2010  . SHORTNESS OF BREATH 01/20/2010  . CHEST PAIN 01/20/2010  . CAD (coronary artery disease) 01/16/2010  . COPD 01/16/2010  . GERD 01/16/2010    Past Surgical History:  Procedure Laterality Date  . COLON SURGERY    . COLOSTOMY    . LEFT ANKLE AMPUTATION    . ROBOT ASSISTED LAPAROSCOPIC COMPLETE CYSTECT ILEAL CONDUIT      OB History    No data available       Home Medications    Prior to Admission medications   Medication Sig Start Date End Date Taking?  Authorizing Provider  aspirin EC 81 MG tablet Take 81 mg by mouth daily.   Yes [provider]  budesonide-formoterol (SYMBICORT) 80-4.5 MCG/ACT inhaler Inhale 2 puffs into the lungs 2 (two) times daily.   Yes [provider]  carboxymethylcellulose (REFRESH PLUS) 0.5 % SOLN 1 drop 4 (four) times daily.   Yes [provider]  chlorhexidine (PERIDEX) 0.12 % solution Use as directed 15 mLs in the mouth or throat 2 (two) times daily.   Yes [provider]  HYDROcodone-acetaminophen (NORCO) 7.5-325 MG tablet Take 1 tablet by mouth 3 (three) times daily.   Yes [provider]  HYDROcodone-acetaminophen (NORCO) 7.5-325 MG tablet Take 1 tablet by mouth 2 (two) times daily as needed for moderate pain.   Yes [provider]  hypromellose (SYSTANE OVERNIGHT THERAPY) 0.3 % GEL ophthalmic ointment Place 1 application into both eyes at bedtime.   Yes [provider]  lamoTRIgine (LAMICTAL) 100 MG tablet Take 100 mg by mouth daily.   Yes [provider]  pregabalin (LYRICA) 100 MG capsule Take 100 mg by mouth 2 (two) times daily.   Yes [provider]  pregabalin (LYRICA) 150 MG capsule Take 150 mg by mouth 2 (two) times daily.   Yes [provider]  primidone (MYSOLINE) 50 MG tablet Take 50 mg by mouth 2 (two) times daily.   Yes [provider]  tiotropium [facility](SPIRIVA)  18 MCG inhalation capsule Place 18 mcg into inhaler and inhale daily.   Yes [provider]  venlafaxine XR (EFFEXOR-XR) 150 MG 24 hr capsule Take 150 mg by mouth daily with breakfast.   Yes [provider]  venlafaxine XR (EFFEXOR-XR) 75 MG 24 hr capsule Take 75 mg by mouth daily with breakfast.   Yes [provider]  Vitamin D, Ergocalciferol, (DRISDOL) 50000 units CAPS capsule Take 50,000 Units by mouth every 7 (seven) days.   Yes [provider]    Family History History reviewed. No pertinent family  history.  Social History Social History  Substance Use Topics  . Smoking status: Former Smoker    Types: Cigarettes  . Smokeless tobacco: Never Used  . Alcohol use No     Allergies   Codeine; Erythromycin; and Penicillins   Review of Systems Review of Systems  Gastrointestinal: Positive for abdominal pain, constipation and nausea.  Genitourinary: Negative for flank pain.  All other systems reviewed and are negative.    Physical Exam Updated Vital Signs BP 129/66   Pulse 77   Temp 98.5 F (36.9 C) (Oral)   Resp 18   SpO2 94%   Physical Exam  Constitutional: She is oriented to person, place, and time. She appears well-developed.  HENT:  Head: Normocephalic and atraumatic.  Eyes: EOM are normal.  Neck: Normal range of motion. Neck supple.  Cardiovascular: Normal rate.   Pulmonary/Chest: Effort normal.  Abdominal: Soft. She exhibits distension. There is tenderness. There is no rebound and no guarding.  Hypotonic bowel sounds  Neurological: She is alert and oriented to person, place, and time.  Skin: Skin is warm and dry.  Nursing note and vitals reviewed.    ED Treatments / Results  Labs (all labs ordered are listed, but only abnormal results are displayed) Labs Reviewed  COMPREHENSIVE METABOLIC PANEL - Abnormal; Notable for the following:       Result Value   Calcium 8.7 (*)    Albumin 3.0 (*)    AST 44 (*)    Alkaline Phosphatase 166 (*)    All other components within normal limits  CBC - Abnormal; Notable for the following:    RDW 16.0 (*)    All other components within normal limits  LIPASE, BLOOD  URINALYSIS, ROUTINE W REFLEX MICROSCOPIC  I-STAT CG4 LACTIC ACID, ED    EKG  EKG Interpretation None       Radiology Ct Abdomen Pelvis W Contrast  Result Date: 09/13/2016 CLINICAL DATA:  Abdominal pain in various locations x5 days with nausea and constipation. Last bowel movement 6 days ago. No emesis. Soft nontender focus lateral to urostomy bag  in the right lower quadrant. Small nontender mass right periumbilical region as well. EXAM: CT ABDOMEN AND PELVIS WITH CONTRAST TECHNIQUE: Multidetector CT imaging of the abdomen and pelvis was performed using the standard protocol following bolus administration of intravenous contrast. CONTRAST:  ISOVUE-300 IOPAMIDOL (ISOVUE-300) INJECTION 61% COMPARISON:  02/26/2009 FINDINGS: Lower chest: Bibasilar subsegmental atelectasis and/or scarring. Included heart appears normal in size without pericardial effusion. Hepatobiliary: Hepatic steatosis. Stable hypodensity along the falciform ligament may reflect fatty infiltration. Status post cholecystectomy. No biliary dilatation. Pancreas: Unremarkable. No pancreatic ductal dilatation or surrounding inflammatory changes. Spleen: Normal in size without focal abnormality. Adrenals/Urinary Tract: There is an 8 x 8 x 9 mm left UPJ stone causing moderate left-sided hydronephrosis and perinephric fat stranding. Atrophic right kidney with 8 mm nonobstructing calculus. Diverting right lower quadrant urostomy with  parastomal hernia containing a portion adjacent ascending colon as well as omental fat. Stomach/Bowel: Physiologically distended stomach. Normal small bowel rotation. Left lateral hernia is again noted containing fluid-filled distended jejunal loops measuring up to 4.5 cm in caliber as well as portions of nondistended descending colon containing stool. There appears to be a right lower quadrant transition point, series 2, image 69 contributing to a partial high-grade or early small bowel obstruction. Desiccated stool noted in the rectosigmoid. Vascular/Lymphatic: Aortic atherosclerosis without aneurysm or dissection. No lymphadenopathy by CT size criteria. Reproductive: Uterus and bilateral adnexa are unremarkable. Other: There is a 5 x 5 x 2.2 cm fat containing right periumbilical ventral hernia. Musculoskeletal: Dextroconvex curvature of the lower thoracic spine.  Lower lumbar facet arthropathy. No acute osseous appearing abnormality. 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 IMPRESSION: 1. 8 x 8 x 9 mm left UPJ stone causing moderate left-sided hydronephrosis. Atrophic native right kidney with nonobstructing renal calculus. 2. Early or high grade partial SBO secondary to right lower quadrant transition point possibly from an adhesion or short segmental stricture involving distal small intestine, series 2, image 69. 3. Right lower quadrant urostomy with new parastomal fat and large bowel containing hernia without evidence of incarceration. This may account for the reported soft tissue distention lateral to the urostomy. 4. Small fat containing right periumbilical ventral hernia. 5. Known left lateral small and large bowel containing hernia. 6. 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 7. Hepatic steatosis. 8. Cholecystectomy. Electronically Signed   By: Tollie Eth M.D.   On: 09/13/2016 15:17    Procedures Procedures (including critical care time)  Medications Ordered in ED Medications  iopamidol (ISOVUE-300) 61 % injection (not administered)  morphine 2 MG/ML injection 4 mg (not administered)  iopamidol (ISOVUE-300) 61 % injection 100 mL (100 mLs Intravenous Contrast Given 09/13/16 1441)     Initial Impression / Assessment and Plan / ED Course  I have reviewed the triage vital signs and the nursing notes.  Pertinent labs & imaging results that were available during my care of the patient were reviewed by me and considered in my medical decision making (see chart for details).  Clinical Course as of Sep 14 1622  Mon Sep 13, 2016  1624 Results from the ER workup discussed with the patient face to face and all questions answered to the best of my ability.   Surgery consulted. There is no severe flank pain. Clinically, the stone is not of clinical  significance at this time, thus we have not paged urology. CT ABDOMEN PELVIS W CONTRAST [AN]    Clinical Course User Index [AN] Derwood Kaplan, MD    Pt comes in with cc of abd pain. Hx of paralysis and she has urostomy and colostomy in place. She has hx of SBO - and clinically it seems like she is obstructed. CT ordered.    Final Clinical Impressions(s) / ED Diagnoses   Final diagnoses:  SBO (small bowel obstruction) (HCC)  Left ureteral stone    New Prescriptions New Prescriptions   No medications on file     Derwood Kaplan, MD 09/13/16 1625    Derwood Kaplan, MD 09/28/16 [id number]

## 2016-09-13 NOTE — Consult Note (Signed)
[facility] Consult Note  Deborah Young [facility] [birth year]  [id number].    Requesting MD: Kathrynn Humble, MD Chief Complaint/Reason for Consult: small bowel obstruction  HPI:  Deborah Young is a 71 y.o. Female with a PMH spina bifida s/p colostomy and ileal conduit when she was 71 y.o., left BKA, HTN, HLD, Obesity, OSA, and COPD who presents to [facility] with 5 days of abdominal pain and constipation. She has a history of incarcerated parastomal hernia and colonic impaction 07/2010 and underwent ex lap, LOA, evacuation of colonic impaction, and repair of parastomal hernia with biologic mesh by Dr. Donnie Mesa. She endorses 6 days of cramping, intermittent, 10/10 abdominal pain. The pain is not in one set location and moves around. Denies aggravating or alleviating factors. Associated symptoms include 6 days of constipation and nausea. At baseline patient empties her colostomy pouch 1-2 x daily. She is a former smoker. Denies use blood thinning medications. She lives at a SNF. Patients sister, Deborah Young, is at bedside and she is the patients POA.  Abdominal surgeries: ileal conduit/colostomy, bladder resection, cholecystectomy, and above procedure by Dr. Georgette Dover.   ED workup: lipase, CBC, and CMET are WNL. CT Abd Pelvis significant for high grade pSBO with transition point in RLQ, 8 x 8 x 9 UPJ stone with R hydronephrosis, RLQ urostomy with new parastomal hernia containing parastomal fat and large bowel without incarceration, and R periumbilical ventral hernia containing fat.   ROS: Review of Systems  Gastrointestinal: Positive for abdominal pain, constipation and nausea.  All other systems reviewed and are negative.   No family history on file.  Past Medical History:  Diagnosis Date  . Acquired absence of ankle, left ([place])   . Atherosclerotic heart disease of native coronary artery without angina pectoris   . Constipation   . COPD (chronic obstructive pulmonary disease) ([place])   . Dysthymic  disorder   . Generalized muscle weakness   . GERD (gastroesophageal reflux disease)   . Hypertension   . Mood disorder ([place])   . Obesity   . Paralytic syndrome ([place])   . Polyneuropathy   . Secondary scoliosis     Past Surgical History:  Procedure Laterality Date  . COLON SURGERY    . COLOSTOMY      Social History:  has no tobacco, alcohol, and drug history on file.  Allergies:  Allergies  Allergen Reactions  . Codeine   . Erythromycin Other (See Comments)    Per MAR  . Penicillins Other (See Comments)    Per MAR  Has patient had a PCN reaction causing immediate rash, facial/tongue/throat swelling, SOB or lightheadedness with hypotension: Unknown Has patient had a PCN reaction causing severe rash involving mucus membranes or skin necrosis: Unknown Has patient had a PCN reaction that required hospitalization: Unknown Has patient had a PCN reaction occurring within the last 10 years: Unknown If all of the above answers are "NO", then may proceed with Cephalosporin use.     (Not in a hospital admission)  Blood pressure 129/66, pulse 77, temperature 98.5 F (36.9 C), temperature source Oral, resp. rate 18, SpO2 94 %. Physical Exam: Physical Exam  Constitutional: She is oriented to person, place, and time. She appears well-developed and well-nourished. No distress.  HENT:  Head: Normocephalic and atraumatic.  Right Ear: External ear normal.  Left Ear: External ear normal.  Mouth/Throat: Oropharynx is clear and moist.  Eyes: EOM are normal. No scleral icterus.  Neck: No tracheal deviation present. No thyromegaly present.  Cardiovascular:  Normal rate, regular rhythm and normal heart sounds.   No murmur heard. Pulmonary/Chest: Effort normal and breath sounds normal. Stridor present. No respiratory distress. She has no wheezes. She has no rales. She exhibits no tenderness.  Abdominal: Soft. She exhibits distension. She exhibits no mass. There is tenderness (Palpation of  left hemi-abdomen ilicits RLQ pain). There is no rebound and no guarding. A hernia (reducible umbilical hernia, large parastomal hernia around urostomy that is reducible and TTP. ) is present.  Urostomy with urine, colostomy bag without flatus or stool.  Musculoskeletal: She exhibits no edema or tenderness.  BKA LLE   Neurological: She is alert and oriented to person, place, and time. A sensory deficit is present.  Skin: Skin is warm and dry. No rash noted. She is not diaphoretic.  Psychiatric: She has a normal mood and affect. Her behavior is normal.    Results for orders placed or performed during the hospital encounter of 09/13/16 (from the past 48 hour(s))  Lipase, blood     Status: None   Collection Time: 09/13/16  1:26 PM  Result Value Ref Range   Lipase 16 11 - 51 U/L  Comprehensive metabolic panel     Status: Abnormal   Collection Time: 09/13/16  1:26 PM  Result Value Ref Range   Sodium 139 135 - 145 mmol/L   Potassium 4.4 3.5 - 5.1 mmol/L   Chloride 104 101 - 111 mmol/L   CO2 24 22 - 32 mmol/L   Glucose, Bld 90 65 - 99 mg/dL   BUN 20 6 - 20 mg/dL   Creatinine, Ser 0.81 0.44 - 1.00 mg/dL   Calcium 8.7 (L) 8.9 - 10.3 mg/dL   Total Protein 7.4 6.5 - 8.1 g/dL   Albumin 3.0 (L) 3.5 - 5.0 g/dL   AST 44 (H) 15 - 41 U/L   ALT 22 14 - 54 U/L   Alkaline Phosphatase 166 (H) 38 - 126 U/L   Total Bilirubin 1.1 0.3 - 1.2 mg/dL   GFR calc non Af Amer >60 >60 mL/min   GFR calc Af Amer >60 >60 mL/min    Comment: (NOTE) The eGFR has been calculated using the CKD EPI equation. This calculation has not been validated in all clinical situations. eGFR's persistently <60 mL/min signify possible Chronic Kidney Disease.    Anion gap 11 5 - 15  CBC     Status: Abnormal   Collection Time: 09/13/16  1:26 PM  Result Value Ref Range   WBC 9.2 4.0 - 10.5 K/uL   RBC 4.39 3.87 - 5.11 MIL/uL   Hemoglobin 12.7 12.0 - 15.0 g/dL   HCT 40.5 36.0 - 46.0 %   MCV 92.3 78.0 - 100.0 fL   MCH 28.9 26.0  - 34.0 pg   MCHC 31.4 30.0 - 36.0 g/dL   RDW 16.0 (H) 11.5 - 15.5 %   Platelets 244 150 - 400 K/uL   Ct Abdomen Pelvis W Contrast  Result Date: 09/13/2016 CLINICAL DATA:  Abdominal pain in various locations x5 days with nausea and constipation. Last bowel movement 6 days ago. No emesis. Soft nontender focus lateral to urostomy bag in the right lower quadrant. Small nontender mass right periumbilical region as well. EXAM: CT ABDOMEN AND PELVIS WITH CONTRAST TECHNIQUE: Multidetector CT imaging of the abdomen and pelvis was performed using the standard protocol following bolus administration of intravenous contrast. CONTRAST:  126m ISOVUE-300 IOPAMIDOL (ISOVUE-300) INJECTION 61% COMPARISON:  02/26/2009 FINDINGS: Lower chest: Bibasilar subsegmental atelectasis and/or scarring.  Included heart appears normal in size without pericardial effusion. Hepatobiliary: Hepatic steatosis. Stable hypodensity along the falciform ligament may reflect fatty infiltration. Status post cholecystectomy. No biliary dilatation. Pancreas: Unremarkable. No pancreatic ductal dilatation or surrounding inflammatory changes. Spleen: Normal in size without focal abnormality. Adrenals/Urinary Tract: There is an 8 x 8 x 9 mm left UPJ stone causing moderate left-sided hydronephrosis and perinephric fat stranding. Atrophic right kidney with 8 mm nonobstructing calculus. Diverting right lower quadrant urostomy with parastomal hernia containing a portion adjacent ascending colon as well as omental fat. Stomach/Bowel: Physiologically distended stomach. Normal small bowel rotation. Left lateral hernia is again noted containing fluid-filled distended jejunal loops measuring up to 4.5 cm in caliber as well as portions of nondistended descending colon containing stool. There appears to be a right lower quadrant transition point, series 2, image 69 contributing to a partial high-grade or early small bowel obstruction. Desiccated stool noted in the  rectosigmoid. Vascular/Lymphatic: Aortic atherosclerosis without aneurysm or dissection. No lymphadenopathy by CT size criteria. Reproductive: Uterus and bilateral adnexa are unremarkable. Other: There is a 5 x 5 x 2.2 cm fat containing right periumbilical ventral hernia. Musculoskeletal: Dextroconvex curvature of the lower thoracic spine. Lower lumbar facet arthropathy. No acute osseous appearing abnormality. 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 IMPRESSION: 1. 8 x 8 x 9 mm left UPJ stone causing moderate left-sided hydronephrosis. Atrophic native right kidney with nonobstructing renal calculus. 2. Early or high grade partial SBO secondary to right lower quadrant transition point possibly from an adhesion or short segmental stricture involving distal small intestine, series 2, image 69. 3. Right lower quadrant urostomy with new parastomal fat and large bowel containing hernia without evidence of incarceration. This may account for the reported soft tissue distention lateral to the urostomy. 4. Small fat containing right periumbilical ventral hernia. 5. Known left lateral small and large bowel containing hernia. 6. 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 7. Hepatic steatosis. 8. Cholecystectomy. Electronically Signed   By: Ashley Royalty M.D.   On: 09/13/2016 15:17   Assessment/Plan pSBO  - multiple abdominal surgeries - CT scan shows left flank hernia, right parastomal hernia containing parastomal fat and large bowel, transition zone in RLQ. - place NGT and start small bowel protocol - will review CT scan with MD.  - NPO, IVF, pain control - admit to medicine for [place], [facility] 09/13/2016, 3:59 PM Pager: [phone] Consults: [phone] Mon-Fri 7:00 am-4:30 pm Sat-Sun 7:00 am-11:30 am

## 2016-09-13 NOTE — ED Notes (Signed)
Provider at bedside. Will obtain labs and EKG when provider finishes patient assessment.

## 2016-09-14 ENCOUNTER — Inpatient Hospital Stay (HOSPITAL_COMMUNITY): Payer: Medicare Other

## 2016-09-14 ENCOUNTER — Encounter (HOSPITAL_COMMUNITY): Payer: Self-pay | Admitting: General Surgery

## 2016-09-14 HISTORY — PX: IR NEPHROSTOMY PLACEMENT LEFT: IMG6063

## 2016-09-14 LAB — BASIC METABOLIC PANEL
ANION GAP: 12 (ref 5–15)
BUN: 18 mg/dL (ref 6–20)
CHLORIDE: 106 mmol/L (ref 101–111)
CO2: 24 mmol/L (ref 22–32)
Calcium: 8.6 mg/dL — ABNORMAL LOW (ref 8.9–10.3)
Creatinine, Ser: 0.75 mg/dL (ref 0.44–1.00)
GFR calc Af Amer: >60 mL/min (ref >60)
GFR calc non Af Amer: >60 mL/min (ref >60)
Glucose, Bld: 106 mg/dL — ABNORMAL HIGH (ref 65–99)
POTASSIUM: 4 mmol/L (ref 3.5–5.1)
SODIUM: 142 mmol/L (ref 135–145)

## 2016-09-14 LAB — CBC
HEMATOCRIT: 38.7 % (ref 36.0–46.0)
HEMOGLOBIN: 12.1 g/dL (ref 12.0–15.0)
MCH: 28.9 pg (ref 26.0–34.0)
MCHC: 31.3 g/dL (ref 30.0–36.0)
MCV: 92.6 fL (ref 78.0–100.0)
Platelets: 248 10*3/uL (ref 150–400)
RBC: 4.18 MIL/uL (ref 3.87–5.11)
RDW: 16.3 % — AB (ref 11.5–15.5)
WBC: 8.3 10*3/uL (ref 4.0–10.5)

## 2016-09-14 LAB — URINALYSIS, ROUTINE W REFLEX MICROSCOPIC
Bilirubin Urine: NEGATIVE
GLUCOSE, UA: NEGATIVE mg/dL
Ketones, ur: >80 mg/dL — AB
Nitrite: POSITIVE — AB
Specific Gravity, Urine: 1.015 (ref 1.005–1.030)
pH: 6 (ref 5.0–8.0)

## 2016-09-14 LAB — URINALYSIS, MICROSCOPIC (REFLEX): Squamous Epithelial / LPF: NONE SEEN

## 2016-09-14 LAB — PROTIME-INR
INR: 1.41
PROTHROMBIN TIME: 17.3 s — AB (ref 11.4–15.2)

## 2016-09-14 LAB — MAGNESIUM: Magnesium: 1.9 mg/dL (ref 1.7–2.4)

## 2016-09-14 LAB — MRSA PCR SCREENING: MRSA by PCR: NEGATIVE

## 2016-09-14 MED ORDER — SODIUM CHLORIDE 0.9 % IV SOLN
1.0000 g | Freq: Three times a day (TID) | INTRAVENOUS | Status: DC
  Administered 2016-09-14 – 2016-09-15 (×2): 1 g via INTRAVENOUS
  Filled 2016-09-14 (×3): qty 1

## 2016-09-14 MED ORDER — FENTANYL CITRATE (PF) 100 MCG/2ML IJ SOLN
INTRAMUSCULAR | Status: AC
  Filled 2016-09-14: qty 4

## 2016-09-14 MED ORDER — LIDOCAINE HCL 1 % IJ SOLN
INTRAMUSCULAR | Status: AC | PRN
  Administered 2016-09-14: 20 mL

## 2016-09-14 MED ORDER — IOPAMIDOL (ISOVUE-300) INJECTION 61%
INTRAVENOUS | Status: AC
  Administered 2016-09-14: 11:00:00 7 mL
  Filled 2016-09-14: qty 50

## 2016-09-14 MED ORDER — FENTANYL CITRATE (PF) 100 MCG/2ML IJ SOLN
INTRAMUSCULAR | Status: DC | PRN
  Administered 2016-09-14: 50 ug via INTRAVENOUS

## 2016-09-14 MED ORDER — LIDOCAINE HCL 1 % IJ SOLN
INTRAMUSCULAR | Status: AC
  Filled 2016-09-14: qty 20

## 2016-09-14 MED ORDER — SODIUM CHLORIDE 0.9 % IV SOLN
25.0000 mg | INTRAVENOUS | Status: DC | PRN
  Filled 2016-09-14: qty 0.5

## 2016-09-14 MED ORDER — IOPAMIDOL (ISOVUE-300) INJECTION 61%
50.0000 mL | Freq: Once | INTRAVENOUS | Status: AC | PRN
  Administered 2016-09-14: 7 mL

## 2016-09-14 MED ORDER — FENTANYL CITRATE (PF) 100 MCG/2ML IJ SOLN
50.0000 ug | INTRAMUSCULAR | Status: DC | PRN
  Administered 2016-09-14 – 2016-09-19 (×7): 50 ug via INTRAVENOUS
  Filled 2016-09-14 (×7): qty 2

## 2016-09-14 MED ORDER — MIDAZOLAM HCL 2 MG/2ML IJ SOLN
INTRAMUSCULAR | Status: AC | PRN
  Administered 2016-09-14: 1 mg via INTRAVENOUS

## 2016-09-14 MED ORDER — HEPARIN SODIUM (PORCINE) 5000 UNIT/ML IJ SOLN
5000.0000 [IU] | Freq: Three times a day (TID) | INTRAMUSCULAR | Status: DC
  Administered 2016-09-15 – 2016-09-20 (×14): 5000 [IU] via SUBCUTANEOUS
  Filled 2016-09-14 (×14): qty 1

## 2016-09-14 MED ORDER — MIDAZOLAM HCL 2 MG/2ML IJ SOLN
INTRAMUSCULAR | Status: AC
  Filled 2016-09-14: qty 6

## 2016-09-14 MED ORDER — CIPROFLOXACIN IN D5W 400 MG/200ML IV SOLN
400.0000 mg | INTRAVENOUS | Status: AC
  Administered 2016-09-14: 400 mg via INTRAVENOUS
  Filled 2016-09-14: qty 200

## 2016-09-14 NOTE — Progress Notes (Signed)
[facility] Progress Note     Subjective: CC:  Abdominal pain, unchanged compared to yesterday. Denies flatus or stool in colostomy pouch.  Objective: Vital signs in last 24 hours: Temp:  [98.5 F (36.9 C)-99.6 F (37.6 C)] 98.8 F (37.1 C) (07/03 0548) Pulse Rate:  [77-90] 81 (07/03 0548) Resp:  [16-20] 19 (07/03 0548) BP: (126-157)/(66-99) 154/66 (07/03 0548) SpO2:  [90 %-98 %] 97 % (07/03 0548) Weight:  [81.1 kg (178 lb 12.7 oz)] 81.1 kg (178 lb 12.7 oz) (07/03 0500)    Intake/Output from previous day: 07/02 0701 - 07/03 0700 In: 858.8 [P.O.:100; I.V.:718.8; NG/GT:40] Out: 1250 [Urine:1100; Emesis/NG output:150] Intake/Output this shift: No intake/output data recorded.  PE: Gen:  Alert, NAD, pleasant HEENT: pupils equal and round, EOMs in tact Card:  Regular rate and rhythm, pedal pulses 2+ BL Pulm:  Normal effort, clear to auscultation bilaterally Abd: Soft, obese, global tenderness without peritonitis, bowel sounds present, left spigelian hernia that is soft and reducible, right parastomal hernia, reducible ventral/periumbilical hernia. Urostomy with urine. Colostomy pouch flat without stool/ flatus - stoma viable.  NGT: 900 cc/24h bilious Skin: warm and dry, no rashes  Psych: A&Ox3   Lab Results:   Recent Labs  09/13/16 1326 09/14/16 0414  WBC 9.2 8.3  HGB 12.7 12.1  HCT 40.5 38.7  PLT 244 248   BMET  Recent Labs  09/13/16 1326 09/14/16 0414  NA 139 142  K 4.4 4.0  CL 104 106  CO2 24 24  GLUCOSE 90 106*  BUN 20 18  CREATININE 0.81 0.75  CALCIUM 8.7* 8.6*   PT/INR  Recent Labs  09/14/16 0414  LABPROT 17.3*  INR 1.41   CMP     Component Value Date/Time   NA 142 09/14/2016 0414   K 4.0 09/14/2016 0414   CL 106 09/14/2016 0414   CO2 24 09/14/2016 0414   GLUCOSE 106 (H) 09/14/2016 0414   BUN 18 09/14/2016 0414   CREATININE 0.75 09/14/2016 0414   CALCIUM 8.6 (L) 09/14/2016 0414   PROT 7.4 09/13/2016 1326   ALBUMIN 3.0  (L) 09/13/2016 1326   AST 44 (H) 09/13/2016 1326   ALT 22 09/13/2016 1326   ALKPHOS 166 (H) 09/13/2016 1326   BILITOT 1.1 09/13/2016 1326   GFRNONAA >60 09/14/2016 0414   GFRAA >60 09/14/2016 0414   Lipase     Component Value Date/Time   LIPASE 16 09/13/2016 1326   Studies/Results: Ct Abdomen Pelvis W Contrast  Result Date: 09/13/2016 CLINICAL DATA:  Abdominal pain in various locations x5 days with nausea and constipation. Last bowel movement 6 days ago. No emesis. Soft nontender focus lateral to urostomy bag in the right lower quadrant. Small nontender mass right periumbilical region as well. EXAM: CT ABDOMEN AND PELVIS WITH CONTRAST TECHNIQUE: Multidetector CT imaging of the abdomen and pelvis was performed using the standard protocol following bolus administration of intravenous contrast. CONTRAST:  ISOVUE-300 IOPAMIDOL (ISOVUE-300) INJECTION 61% COMPARISON:  02/26/2009 FINDINGS: Lower chest: Bibasilar subsegmental atelectasis and/or scarring. Included heart appears normal in size without pericardial effusion. Hepatobiliary: Hepatic steatosis. Stable hypodensity along the falciform ligament may reflect fatty infiltration. Status post cholecystectomy. No biliary dilatation. Pancreas: Unremarkable. No pancreatic ductal dilatation or surrounding inflammatory changes. Spleen: Normal in size without focal abnormality. Adrenals/Urinary Tract: There is an 8 x 8 x 9 mm left UPJ stone causing moderate left-sided hydronephrosis and perinephric fat stranding. Atrophic right kidney with 8 mm nonobstructing calculus. Diverting right lower quadrant urostomy with  parastomal hernia containing a portion adjacent ascending colon as well as omental fat. Stomach/Bowel: Physiologically distended stomach. Normal small bowel rotation. Left lateral hernia is again noted containing fluid-filled distended jejunal loops measuring up to 4.5 cm in caliber as well as portions of nondistended descending colon containing  stool. There appears to be a right lower quadrant transition point, series 2, image 69 contributing to a partial high-grade or early small bowel obstruction. Desiccated stool noted in the rectosigmoid. Vascular/Lymphatic: Aortic atherosclerosis without aneurysm or dissection. No lymphadenopathy by CT size criteria. Reproductive: Uterus and bilateral adnexa are unremarkable. Other: There is a 5 x 5 x 2.2 cm fat containing right periumbilical ventral hernia. Musculoskeletal: Dextroconvex curvature of the lower thoracic spine. Lower lumbar facet arthropathy. No acute osseous appearing abnormality. 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 IMPRESSION: 1. 8 x 8 x 9 mm left UPJ stone causing moderate left-sided hydronephrosis. Atrophic native right kidney with nonobstructing renal calculus. 2. Early or high grade partial SBO secondary to right lower quadrant transition point possibly from an adhesion or short segmental stricture involving distal small intestine, series 2, image 69. 3. Right lower quadrant urostomy with new parastomal fat and large bowel containing hernia without evidence of incarceration. This may account for the reported soft tissue distention lateral to the urostomy. 4. Small fat containing right periumbilical ventral hernia. 5. Known left lateral small and large bowel containing hernia. 6. 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 7. Hepatic steatosis. 8. Cholecystectomy. Electronically Signed   By: Tollie Ethavid  Kwon M.D.   On: 09/13/2016 15:17   Dg Chest Port 1 View  Result Date: 09/13/2016 CLINICAL DATA:  Shortness of breath. EXAM: PORTABLE CHEST 1 VIEW COMPARISON:  01/26/2016 and prior exam FINDINGS: Upper limits normal heart size again noted. Patchy right lower lung opacities are noted and may represent airspace disease/ pneumonia. Mild left basilar scarring/ atelectasis is unchanged. There is no  evidence of pleural effusion or pneumothorax. A severe thoracic scoliosis is again noted. IMPRESSION: Patchy right lower lung opacities -question pneumonia. No other acute abnormality. Electronically Signed   By: Harmon PierJeffrey  Hu M.D.   On: 09/13/2016 16:50   Dg Abd Portable 1v-small Bowel Obstruction Protocol-initial, 8 Hr Delay  Result Date: 09/14/2016 CLINICAL DATA:  Small bowel obstruction.  8 hour delayed film. EXAM: PORTABLE ABDOMEN - 1 VIEW COMPARISON:  Radiographs and CT yesterday FINDINGS: Enteric tube in place. Administered enteric contrast within bowel loops in both the right and left abdomen. There may be contrast in the colon in the right abdomen versus dilated small bowel. Air-filled dilated small bowel in the central abdomen again seen. No free air. IMPRESSION: Enteric contrast possibly but not definitively seen within the right colon. Recommend 24 exam for confirmation. Dilated small bowel in the central abdomen again seen. Electronically Signed   By: Rubye OaksMelanie  Ehinger M.D.   On: 09/14/2016 03:11   Dg Abd Portable 1v-small Bowel Protocol-position Verification  Result Date: 09/13/2016 CLINICAL DATA:  NG tube placement EXAM: PORTABLE ABDOMEN - 1 VIEW COMPARISON:  None. FINDINGS: 1810 hours. NG tube tip is is positioned in the mid stomach. Contrast material in the left kidney and intrarenal collecting system is compatible with the CT scan earlier today. Mild fullness of the left intrarenal collecting system evident. Visualized bowel loops are diffusely gas distended. IMPRESSION: NG tube tip is in the mid stomach. Electronically Signed   By: Jamison OkaEric  Mansell M.D.  On: 09/13/2016 18:28   Anti-infectives: Anti-infectives    None     Assessment/Plan pSBO  - afebrile, no leukocytosis, lactate normal -  multiple abdominal surgeries -  CT scan shows transition zone in RLQ; also present are left spigelian, right parastomal, and ventral/periumbilical hernias- all of which appear non-obstructing.   -   AXR today shows possible contrast in right colon, repeat AXR in AM. - continue NPO, IVF, and  NGT to LIWS  Spina Bifida Obesity OSA, uses CPAP HTN HLD COPD   LOS: 1 day    Adam Phenix , [facility] Surgery 09/14/2016, 8:00 AM Pager: [phone] Consults: [phone] Mon-Fri 7:00 am-4:30 pm Sat-Sun 7:00 am-11:30 am

## 2016-09-14 NOTE — Consult Note (Cosign Needed)
WOC Nurse ostomy consult note Stoma type/location: LUQ colostomy Stomal assessment/size: 1 and 3/4 x 2 inches oval, flat, os at center, pink, tacky/dry Peristomal assessment: patient cuts pouch aperture very wide.  That said, skin is clear and free of complications. Treatment options for stomal/peristomal skin: skin barrier ring Output: none in pouch Ostomy pouching:2pc. 2 and 3/4 inch ostomy pouching system  Education provided: None Enrolled patient in [employer] program: No. This is an established ostomy. She wears a [employer] 2-piece, 4 inch pouching system.   WOC Nurse ostomy consult note Stoma type/location: RLQ urostomy Stomal assessment/size: 1 and 1/4 inches round, viewed through pouch.  Patient does not wish pouch to be changed today. Peristomal assessment: Not seen today Treatment options for stomal/peristomal skin: None indicated Output: dark yellow urine  Ostomy pouching: 1pc. Patient wears a 1-piece convex [employer] urostomy pouch (Dura[employer]ive)  Education provided: Patient instructed that we will obtain 3 pouches that are similar, albeit from a different manufacturer.  If she is not in the hospital for very long, we will not have to change her pouch as she states she just applied this one yesterday and wears them for about a week.  Should the pouch leak or there be any other trouble, I wanted her to have backup supplies at the bedside to avoid delays in care.  She is agreeable to this POC. Enrolled patient in [employer] DC program: No. This is an established ostomy.    WOC Nurse wound consult note Reason for Consult: full thickness wound in gluteal cleft.  This is not a pressure injury Wound type: etiology is not known Pressure Injury POA: N/A Measurement: 0.8cm x 0.4cm x 0.1cm Wound YNW:GNFAbed:Pink, moist Drainage (amount, consistency, odor) This appears to be an old cyst cavity, is clean and not a pressure injury Periwound: Clear, dry, intact Dressing  procedure/placement/frequency: I will ask Nursing to cleanse this area twice daily and assess/inspect.  We will cover with a silicone foam, turn and reposition from side to side avoiding the supine position and float the heels.    WOC nursing team will not follow, but will remain available to this patient, the nursing and medical teams.  Please re-consult if needed. Thanks, Ladona MowLaurie Cynthia Stainback, MSN, RN, GNP, Hans EdenCWOCN, CWON-AP, FAAN  Pager# [phone]   Time spent in encounter exclusive of documentation = 1 hour.

## 2016-09-14 NOTE — [place] FL2 (Signed)
[place] MEDICAID FL2 LEVEL OF CARE SCREENING TOOL     IDENTIFICATION  Patient Name: Deborah Young Birthdate: 10/03/1945 Sex: female Admission Date (Current Location): 09/13/2016  [facility] and [place] Number:  Producer, television/film/video and Address:  [facility],  [address], [place] [address]      Provider Number: [id number]  Attending Physician Name and Address:  Leroy Sea, MD  Relative Name and Phone Number:       Current Level of Care: Hospital Recommended Level of Care: Skilled Nursing Facility Prior Approval Number:    Date Approved/Denied:   PASRR Number: [id number]  Discharge Plan: SNF    Current Diagnoses: Patient Active Problem List   Diagnosis Date Noted  . SBO (small bowel obstruction) (HCC) 09/13/2016  . DYSLIPIDEMIA 01/20/2010  . Essential hypertension, benign 01/20/2010  . SHORTNESS OF BREATH 01/20/2010  . CHEST PAIN 01/20/2010  . CAD (coronary artery disease) 01/16/2010  . COPD 01/16/2010  . GERD 01/16/2010    Orientation RESPIRATION BLADDER Height & Weight     Self, Time, Situation, Place (forgetful)   (CPAP) Urostomy Weight: 178 lb 12.7 oz (81.1 kg) Height:  4\' 11"  (149.9 cm)  BEHAVIORAL SYMPTOMS/MOOD NEUROLOGICAL BOWEL NUTRITION STATUS  Other (Comment) (no behavuiors)   Colostomy Diet (See DC Summary - NPO at this time.)  AMBULATORY STATUS COMMUNICATION OF NEEDS Skin   Extensive Assist Verbally  (non pressure wound on sacrum)                       Personal Care Assistance Level of Assistance  Bathing, Feeding, Dressing Bathing Assistance: Maximum assistance Feeding assistance: Independent Dressing Assistance: Maximum assistance     Functional Limitations Info  Sight, Hearing, Speech Sight Info: Impaired Hearing Info: Adequate Speech Info: Adequate    SPECIAL CARE FACTORS FREQUENCY                       Contractures Contractures Info: Not present    Additional Factors Info  Code Status (DNR)  Code Status Info: DNR             Current Medications (09/14/2016):  This is the current hospital active medication list Current Facility-Administered Medications  Medication Dose Route Frequency Provider Last Rate Last Dose  . albuterol (PROVENTIL) (2.5 MG/3ML) 0.083% nebulizer solution 2.5 mg  2.5 mg Nebulization Q4H PRN Leroy Sea, MD      . artificial tears (LACRILUBE) ophthalmic ointment   Both Eyes QHS Leroy Sea, MD      . chlorhexidine (PERIDEX) 0.12 % solution 15 mL  15 mL Mouth/Throat BID Leroy Sea, MD   15 mL at 09/14/16 0803  . dextrose 5 % with KCl 20 mEq / L  infusion  20 mEq Intravenous Continuous Leroy Sea, MD 75 mL/hr at 09/13/16 2022 20 mEq at 09/13/16 2022  . [START ON 09/15/2016] heparin injection 5,000 Units  5,000 Units Subcutaneous Q8H Barnetta Chapel, PA-C      . hydrALAZINE (APRESOLINE) injection 10 mg  10 mg Intravenous Q6H PRN Leroy Sea, MD      . metoprolol tartrate (LOPRESSOR) injection 5 mg  5 mg Intravenous Q4H PRN Leroy Sea, MD      . mometasone-formoterol [facility]) 100-5 MCG/ACT inhaler 2 puff  2 puff Inhalation BID Leroy Sea, MD   2 puff at 09/13/16 2002  . morphine 2 MG/ML injection 4 mg  4 mg Intravenous Q4H  PRN Leroy SeaSingh, Prashant K, MD   4 mg at 09/13/16 1705  . morphine 4 MG/ML injection 2 mg  2 mg Intravenous Q4H PRN Leroy SeaSingh, Prashant K, MD   2 mg at 09/14/16 0802  . ondansetron (ZOFRAN) tablet 4 mg  4 mg Oral Q6H PRN Leroy SeaSingh, Prashant K, MD       Or  . ondansetron [facility](ZOFRAN) injection 4 mg  4 mg Intravenous Q6H PRN Leroy SeaSingh, Prashant K, MD      . polyvinyl alcohol (LIQUIFILM TEARS) 1.4 % ophthalmic solution 1 drop  1 drop Both Eyes QID Leroy SeaSingh, Prashant K, MD   1 drop at 09/14/16 0803  . pregabalin (LYRICA) capsule 150 mg  150 mg Oral BID Leroy SeaSingh, Prashant K, MD   150 mg at 09/14/16 0803  . tiotropium (SPIRIVA) inhalation capsule 18 mcg  18 mcg Inhalation Daily Leroy SeaSingh, Prashant K, MD      . venlafaxine XR (EFFEXOR-XR) 24  hr capsule 150 mg  150 mg Oral Q breakfast Leroy SeaSingh, Prashant K, MD   150 mg at 09/14/16 0803  . venlafaxine XR (EFFEXOR-XR) 24 hr capsule 75 mg  75 mg Oral Q breakfast Leroy SeaSingh, Prashant K, MD   75 mg at 09/14/16 0803     Discharge Medications: Please see discharge summary for a list of discharge medications.  Relevant Imaging Results:  Relevant Lab Results:   Additional Information SS # [id number]  Chrisandra Wiemers, Dickey GaveJamie Lee, LCSW

## 2016-09-14 NOTE — Procedures (Signed)
Successful placement of left nephrostomy tube.  Minimal blood loss and no immediate complication.  10 Fr drain placed.

## 2016-09-14 NOTE — Consult Note (Signed)
Chief Complaint: UPJ obstruction with left hydronephrosis  Referring Physician:Dr. Alexis Frock  Supervising Physician: Markus Daft  Patient Status: [facility] - In-pt  HPI: Deborah Young is a 71 y.o. female with a history of spina bifida who had a urostomy and colostomy given to her at the age of 34.  She has since had multiple other medical issues.  Over the last several days at her facility, she has had generalized abdominal pain with nausea and vomiting.  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  We have been asked to see the patient for placement of a PCN as this is her only functioning kidney currently.  The right is atrophic and nonfunctioning.  Past Medical History:  Past Medical History:  Diagnosis Date  . Acquired absence of ankle, left ([place])   . Atherosclerotic heart disease of native coronary artery without angina pectoris   . Constipation   . COPD (chronic obstructive pulmonary disease) ([place])   . Dysthymic disorder   . Generalized muscle weakness   . GERD (gastroesophageal reflux disease)   . Hypertension   . Mood disorder ([place])   . Obesity   . Paralytic syndrome ([place])   . Polyneuropathy   . Secondary scoliosis   . Sleep apnea     Past Surgical History:  Past Surgical History:  Procedure Laterality Date  . COLON SURGERY    . COLOSTOMY    . LEFT ANKLE AMPUTATION    . ROBOT ASSISTED LAPAROSCOPIC COMPLETE CYSTECT ILEAL CONDUIT      Family History: History reviewed. No pertinent family history.  Social History:  reports that she has quit smoking. Her smoking use included Cigarettes. She has never used smokeless tobacco. She reports that she does not drink alcohol or use drugs.  Allergies:  Allergies  Allergen Reactions  . Codeine   . Erythromycin Other (See Comments)    Per MAR  . Penicillins Other (See Comments)    Per MAR  Has patient had a  PCN reaction causing immediate rash, facial/tongue/throat swelling, SOB or lightheadedness with hypotension: Unknown Has patient had a PCN reaction causing severe rash involving mucus membranes or skin necrosis: Unknown Has patient had a PCN reaction that required hospitalization: Unknown Has patient had a PCN reaction occurring within the last 10 years: Unknown If all of the above answers are "NO", then may proceed with Cephalosporin use.    Medications: Medications reviewed in epic  Please HPI for pertinent positives, otherwise complete 10 system ROS negative.  Mallampati Score: MD Evaluation Airway: WNL Heart: WNL Abdomen: Other (comments) Abdomen comments: urostomy and colostomy present Chest/ Lungs: WNL ASA  Classification: 3 Mallampati/Airway Score: Two  Physical Exam: BP (!) 154/66 (BP Location: Left Arm)   Pulse 81   Temp 98.8 F (37.1 C) (Oral)   Resp 19   Ht _0  (1.499 m)   Wt 178 lb 12.7 oz (81.1 kg)   SpO2 97%   BMI 36.11 kg/m  Body mass index is 36.11 kg/m. General: pleasant, obese white female who is laying in bed in NAD HEENT: head is normocephalic, atraumatic.  Sclera are noninjected.  PERRL.  Ears and nose without any masses or lesions, NGT present with bilious output.  Mouth is pink and dry. Heart: regular, rate, and rhythm.  Normal s1,s2. No obvious murmurs, gallops, or rubs noted.  Palpable radial pulses bilaterally Lungs: CTAB,  no wheezes, rhonchi, or rales noted.  Respiratory effort nonlabored Abd: soft, mild diffuse tenderness, obese, +BS, no masses.  +urostomy and colostomy with no current output. 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   Labs: Results for orders placed or performed during the hospital encounter of 09/13/16 (from the past 48 hour(s))  Lipase, blood     Status: None   Collection Time: 09/13/16  1:26 PM  Result Value Ref Range   Lipase 16 11 - 51 U/L    Comprehensive metabolic panel     Status: Abnormal   Collection Time: 09/13/16  1:26 PM  Result Value Ref Range   Sodium 139 135 - 145 mmol/L   Potassium 4.4 3.5 - 5.1 mmol/L   Chloride 104 101 - 111 mmol/L   CO2 24 22 - 32 mmol/L   Glucose, Bld 90 65 - 99 mg/dL   BUN 20 6 - 20 mg/dL   Creatinine, Ser 0.81 0.44 - 1.00 mg/dL   Calcium 8.7 (L) 8.9 - 10.3 mg/dL   Total Protein 7.4 6.5 - 8.1 g/dL   Albumin 3.0 (L) 3.5 - 5.0 g/dL   AST 44 (H) 15 - 41 U/L   ALT 22 14 - 54 U/L   Alkaline Phosphatase 166 (H) 38 - 126 U/L   Total Bilirubin 1.1 0.3 - 1.2 mg/dL   GFR calc non Af Amer >60 >60 mL/min   GFR calc Af Amer >60 >60 mL/min    Comment: (NOTE) The eGFR has been calculated using the CKD EPI equation. This calculation has not been validated in all clinical situations. eGFR's persistently <60 mL/min signify possible Chronic Kidney Disease.    Anion gap 11 5 - 15  CBC     Status: Abnormal   Collection Time: 09/13/16  1:26 PM  Result Value Ref Range   WBC 9.2 4.0 - 10.5 K/uL   RBC 4.39 3.87 - 5.11 MIL/uL   Hemoglobin 12.7 12.0 - 15.0 g/dL   HCT 40.5 36.0 - 46.0 %   MCV 92.3 78.0 - 100.0 fL   MCH 28.9 26.0 - 34.0 pg   MCHC 31.4 30.0 - 36.0 g/dL   RDW 16.0 (H) 11.5 - 15.5 %   Platelets 244 150 - 400 K/uL  I-Stat CG4 Lactic Acid, ED     Status: None   Collection Time: 09/13/16  4:57 PM  Result Value Ref Range   Lactic Acid, Venous 0.79 0.5 - 1.9 mmol/L  TSH     Status: None   Collection Time: 09/13/16  7:49 PM  Result Value Ref Range   TSH 1.586 0.350 - 4.500 uIU/mL    Comment: Performed by a 3rd Generation assay with a functional sensitivity of <=0.01 uIU/mL.  Urinalysis, Routine w reflex microscopic     Status: Abnormal   Collection Time: 09/14/16  2:20 AM  Result Value Ref Range   Color, Urine YELLOW YELLOW   APPearance HAZY (A) CLEAR   Specific Gravity, Urine 1.015 1.005 - 1.030   pH 6.0 5.0 - 8.0   Glucose, UA NEGATIVE NEGATIVE mg/dL   Hgb urine dipstick SMALL (A)  NEGATIVE   Bilirubin Urine NEGATIVE NEGATIVE   Ketones, ur >80 (A) NEGATIVE mg/dL   Protein, ur TRACE (A) NEGATIVE mg/dL   Nitrite POSITIVE (A) NEGATIVE   Leukocytes, UA MODERATE (A) NEGATIVE  Urinalysis, Microscopic (reflex)     Status: Abnormal   Collection Time: 09/14/16  2:20 AM  Result Value Ref Range   RBC / HPF 6-30 0 - 5 RBC/hpf   WBC, UA TOO NUMEROUS TO COUNT 0 - 5 WBC/hpf   Bacteria, UA RARE (A) NONE SEEN   Squamous Epithelial / LPF NONE SEEN NONE SEEN   Mucous PRESENT   Basic metabolic panel     Status: Abnormal   Collection Time: 09/14/16  4:14 AM  Result Value Ref Range   Sodium 142 135 - 145 mmol/L   Potassium 4.0 3.5 - 5.1 mmol/L   Chloride 106 101 - 111 mmol/L   CO2 24 22 - 32 mmol/L   Glucose, Bld 106 (H) 65 - 99 mg/dL   BUN 18 6 - 20 mg/dL   Creatinine, Ser 0.75 0.44 - 1.00 mg/dL   Calcium 8.6 (L) 8.9 - 10.3 mg/dL   GFR calc non Af Amer >60 >60 mL/min   GFR calc Af Amer >60 >60 mL/min    Comment: (NOTE) The eGFR has been calculated using the CKD EPI equation. This calculation has not been validated in all clinical situations. eGFR's persistently <60 mL/min signify possible Chronic Kidney Disease.    Anion gap 12 5 - 15  CBC     Status: Abnormal   Collection Time: 09/14/16  4:14 AM  Result Value Ref Range   WBC 8.3 4.0 - 10.5 K/uL   RBC 4.18 3.87 - 5.11 MIL/uL   Hemoglobin 12.1 12.0 - 15.0 g/dL   HCT 38.7 36.0 - 46.0 %   MCV 92.6 78.0 - 100.0 fL   MCH 28.9 26.0 - 34.0 pg   MCHC 31.3 30.0 - 36.0 g/dL   RDW 16.3 (H) 11.5 - 15.5 %   Platelets 248 150 - 400 K/uL  Magnesium     Status: None   Collection Time: 09/14/16  4:14 AM  Result Value Ref Range   Magnesium 1.9 1.7 - 2.4 mg/dL  Protime-INR     Status: Abnormal   Collection Time: 09/14/16  4:14 AM  Result Value Ref Range   Prothrombin Time 17.3 (H) 11.4 - 15.2 seconds   INR 1.41   MRSA PCR Screening     Status: None   Collection Time: 09/14/16  6:32 AM  Result Value Ref Range   MRSA by PCR  NEGATIVE NEGATIVE    Comment:        The GeneXpert MRSA Assay (FDA approved for NASAL specimens only), is one component of a comprehensive MRSA colonization surveillance program. It is not intended to diagnose MRSA infection nor to guide or monitor treatment for MRSA infections.     Imaging: Ct Abdomen Pelvis W Contrast  Result Date: 09/13/2016 CLINICAL DATA:  Abdominal pain in various locations x5 days with nausea and constipation. Last bowel movement 6 days ago. No emesis. Soft nontender focus lateral to urostomy bag in the right lower quadrant. Small nontender mass right periumbilical region as well. EXAM: CT ABDOMEN AND PELVIS WITH CONTRAST TECHNIQUE: Multidetector CT imaging of the abdomen and pelvis was performed using the standard protocol following bolus administration of intravenous contrast. CONTRAST:  161m ISOVUE-300 IOPAMIDOL (ISOVUE-300) INJECTION 61% COMPARISON:  02/26/2009 FINDINGS: Lower chest: Bibasilar subsegmental atelectasis and/or scarring. Included heart appears normal in size without pericardial effusion. Hepatobiliary: Hepatic steatosis. Stable hypodensity along the falciform ligament may reflect fatty infiltration. Status post cholecystectomy. No biliary dilatation. Pancreas: Unremarkable. No pancreatic ductal dilatation or surrounding inflammatory changes. Spleen: Normal in size without focal abnormality. Adrenals/Urinary Tract: There is an 8 x 8 x  9 mm left UPJ stone causing moderate left-sided hydronephrosis and perinephric fat stranding. Atrophic right kidney with 8 mm nonobstructing calculus. Diverting right lower quadrant urostomy with parastomal hernia containing a portion adjacent ascending colon as well as omental fat. Stomach/Bowel: Physiologically distended stomach. Normal small bowel rotation. Left lateral hernia is again noted containing fluid-filled distended jejunal loops measuring up to 4.5 cm in caliber as well as portions of nondistended descending colon  containing stool. There appears to be a right lower quadrant transition point, series 2, image 69 contributing to a partial high-grade or early small bowel obstruction. Desiccated stool noted in the rectosigmoid. Vascular/Lymphatic: Aortic atherosclerosis without aneurysm or dissection. No lymphadenopathy by CT size criteria. Reproductive: Uterus and bilateral adnexa are unremarkable. Other: There is a 5 x 5 x 2.2 cm fat containing right periumbilical ventral hernia. Musculoskeletal: Dextroconvex curvature of the lower thoracic spine. Lower lumbar facet arthropathy. No acute osseous appearing abnormality. 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 IMPRESSION: 1. 8 x 8 x 9 mm left UPJ stone causing moderate left-sided hydronephrosis. Atrophic native right kidney with nonobstructing renal calculus. 2. Early or high grade partial SBO secondary to right lower quadrant transition point possibly from an adhesion or short segmental stricture involving distal small intestine, series 2, image 69. 3. Right lower quadrant urostomy with new parastomal fat and large bowel containing hernia without evidence of incarceration. This may account for the reported soft tissue distention lateral to the urostomy. 4. Small fat containing right periumbilical ventral hernia. 5. Known left lateral small and large bowel containing hernia. 6. 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 7. Hepatic steatosis. 8. Cholecystectomy. Electronically Signed   By: Ashley Royalty M.D.   On: 09/13/2016 15:17   Dg Chest Port 1 View  Result Date: 09/13/2016 CLINICAL DATA:  Shortness of breath. EXAM: PORTABLE CHEST 1 VIEW COMPARISON:  01/26/2016 and prior exam FINDINGS: Upper limits normal heart size again noted. Patchy right lower lung opacities are noted and may represent airspace disease/ pneumonia. Mild left basilar scarring/ atelectasis is unchanged.  There is no evidence of pleural effusion or pneumothorax. A severe thoracic scoliosis is again noted. IMPRESSION: Patchy right lower lung opacities -question pneumonia. No other acute abnormality. Electronically Signed   By: Margarette Canada M.D.   On: 09/13/2016 16:50   Dg Abd Portable 1v-small Bowel Obstruction Protocol-initial, 8 Hr Delay  Result Date: 09/14/2016 CLINICAL DATA:  Small bowel obstruction.  8 hour delayed film. EXAM: PORTABLE ABDOMEN - 1 VIEW COMPARISON:  Radiographs and CT yesterday FINDINGS: Enteric tube in place. Administered enteric contrast within bowel loops in both the right and left abdomen. There may be contrast in the colon in the right abdomen versus dilated small bowel. Air-filled dilated small bowel in the central abdomen again seen. No free air. IMPRESSION: Enteric contrast possibly but not definitively seen within the right colon. Recommend 24 exam for confirmation. Dilated small bowel in the central abdomen again seen. Electronically Signed   By: Jeb Levering M.D.   On: 09/14/2016 03:11   Dg Abd Portable 1v-small Bowel Protocol-position Verification  Result Date: 09/13/2016 CLINICAL DATA:  NG tube placement EXAM: PORTABLE ABDOMEN - 1 VIEW COMPARISON:  None. FINDINGS: 1810 hours. NG tube tip is is positioned in the mid stomach. Contrast material in the left kidney and intrarenal collecting system is compatible with the CT scan earlier today. Mild fullness of the left intrarenal collecting  system evident. Visualized bowel loops are diffusely gas distended. IMPRESSION: NG tube tip is in the mid stomach. Electronically Signed   By: Misty Stanley M.D.   On: 09/13/2016 18:28    Assessment/Plan 1. UPJ obstruction with left hydronephrosis  We will plan to place a left PCN today at the request of Dr. Tresa Moore.  I have spoken to the patient's sister, Deborah Young, as the patient has some confusion and mental delay per the sister.  The patient's vitals and labs have been  reviewed. Risks and Benefits discussed with the patient and her sister including, but not limited to infection, bleeding, significant bleeding causing loss or decrease in renal function or damage to adjacent structures.  All of the patient and patient's sister's questions were answered, patient's sister is agreeable to proceed. Consent signed and in chart.  Thank you for this interesting consult.  I greatly enjoyed meeting Deborah Young and look forward to participating in their care.  A copy of this report was sent to the requesting provider on this date.  Electronically Signed: Henreitta Cea 09/14/2016, 9:29 AM   I spent a total of 40 Minutes    in face to face in clinical consultation, greater than 50% of which was counseling/coordinating care for left hydronephrosis

## 2016-09-14 NOTE — Clinical Social Work Note (Signed)
Clinical Social Work Assessment  Patient Details  Name: Deborah Young MRN: [id number] Date of Birth: [birth year]  Date of referral:  09/14/16               Reason for consult:  Facility Placement, Discharge Planning                Permission sought to share information with:  Facility Art therapist granted to share information::  Yes, Verbal Permission Granted  Name::        Agency::     Relationship::     Contact Information:     Housing/Transportation Living arrangements for the past 2 months:  [place] of Information:  Patient, Other (Comment Required) (sister) Patient Interpreter Needed:  None Criminal Activity/Legal Involvement Pertinent to Current Situation/Hospitalization:  No - Comment as needed Significant Relationships:  Siblings Lives with:  Facility Resident Do you feel safe going back to the place where you live?  Yes Need for family participation in patient care:  Yes (Comment)  Care giving concerns:  No concerns reported at this time.   Social Worker assessment / plan:  Pt hospitalized from [place] ( [place] ) on 09/13/16 with a small bowel obstruction. Pt has a hx of Left BKA. CSW met with pt at bedside to offer support and assistance with d/c planning. Pt reports that she plans to return to [place] at d/c. Pt gave CSW permission to contact her sister, Deborah Young, [phone]. Pt's sister has confirmed d/c plan, as well. CSW has sent clinicals to SNF for review. SNF will re admit pt once stable for d/c. CSW will continue to follow to assist with d/c planning.  Employment status:  Retired Forensic scientist:  Medicare PT Recommendations:  Not assessed at this time Information / Referral to community resources:     Patient/Family's Response to care:  Pt / sister expect pt to return to [place] at d/c.  Patient/Family's Understanding of and Emotional Response to Diagnosis, Current Treatment, and Prognosis:  " I feel  awful. " Pt reports that she is looking forward to feeling better and returning to [facility].  Emotional Assessment Appearance:  Appears stated age Attitude/Demeanor/Rapport:  Other (cooperative) Affect (typically observed):  Calm, Flat Orientation:  Oriented to Self, Oriented to Place, Oriented to  Time, Oriented to Situation Alcohol / Substance use:  Not Applicable Psych involvement (Current and /or in the community):  No (Comment)  Discharge Needs  Concerns to be addressed:  Discharge Planning Concerns Readmission within the last 30 days:  No Current discharge risk:  None Barriers to Discharge:  No Barriers Identified   Luretha Rued, [place] 09/14/2016, 9:31 AM

## 2016-09-14 NOTE — Progress Notes (Signed)
@IPLOG @        PROGRESS NOTE                                                                                                                                                                                                             Patient Demographics:    Deborah Young, is a 71 y.o. female, DOB - 06-21-1945, RUE:[id number]  Admit date - 09/13/2016   Admitting Physician Leroy Sea, MD  Outpatient Primary MD for the patient is Nadara Eaton, MD  LOS - 1  Chief Complaint  Patient presents with  . Abdominal Pain  . Constipation       Brief Narrative     Deborah Young  is a 71 y.o. female,With history of spina bifida, left BKA, right foot multiple toe amputation, diverting colostomy and urostomy at the age of 42, GERD, obesity, COPD, peripheral neuropathy, history of small bowel obstruction requiring surgical correction 7-8 yrs ago, sleep apnea, who lives at a SNF and is brought in for 5-6 day history of gradually progressive abdominal pain, nausea and lack of stool and flutters in her colostomy bag. Symptoms persisted and were getting gradually worse to the point that she came to the ER where workup revealed small bowel obstruction and a incidental finding of left UPJ stone. General surgery was consulted and I was requested to admit the patient.   Subjective:    Deborah Young today has, No headache, No chest pain, Still has some of epigastric abdominal pain and discomfort along with mild nausea, No new weakness tingling or numbness, No Cough - SOB.    Assessment  & Plan :      1.Small bowel obstruction. With history of similar problems 7-8 years ago requiring surgery, Continue conservative management with bowel rest, NG tube, IV fluids, she still has some abdominal discomfort but her abdomen is less distended than yesterday, and tinea supportive care with pain control and nausea control, general surgery on board and following. Defer management of this problem to general  surgery.  2. Incidental left UPJ stone with hydronephrosis. No leukocytosis and remains afebrile, creatinine remains stable as well, have consulted urology to monitor.  3. Morbid obesity with obstructive sleep apnea. Follow with PCP for weight loss, CPAP daily at bedtime.  4. Spina bifida, left BKA, right multiple toe amputations in the past. Supportive care, PT once better, back to SNF once medically stable.  5. COPD. Stable no wheezing, nebulizer treatments and oxygen as needed.  6. Depression. Home medications continued as tolerated  orally.    Diet : Diet NPO time specified Except for: Sips with Meds   Family Communication  : Sister on the day of admission  Code Status :  Full  Disposition Plan  : SNF once better  Consults  :  Surgery  Procedures  :    CT scan abdomen and pelvis showing bowel obstruction.  DVT Prophylaxis  :   Heparin    Lab Results  Component Value Date   PLT 248 09/14/2016    Inpatient Medications  Scheduled Meds: . artificial tears   Both Eyes QHS  . chlorhexidine  15 mL Mouth/Throat BID  . fentaNYL      . [START ON 09/15/2016] heparin  5,000 Units Subcutaneous Q8H  . lidocaine      . midazolam      . mometasone-formoterol  2 puff Inhalation BID  . polyvinyl alcohol  1 drop Both Eyes QID  . pregabalin  150 mg Oral BID  . tiotropium  18 mcg Inhalation Daily  . venlafaxine XR  150 mg Oral Q breakfast  . venlafaxine XR  75 mg Oral Q breakfast   Continuous Infusions: . ciprofloxacin 400 mg (09/14/16 1024)  . dextrose 5 % with KCl 20 mEq / L 20 mEq (09/13/16 2022)  . diphenhydrAMINE (BENADRYL) IVPB(SICKLE CELL ONLY)     PRN Meds:.albuterol, diphenhydrAMINE (BENADRYL) IVPB(SICKLE CELL ONLY), fentaNYL (SUBLIMAZE) injection, hydrALAZINE, lidocaine, metoprolol tartrate, midazolam, morphine injection, ondansetron **OR** ondansetron (ZOFRAN) IV  Antibiotics  :    Anti-infectives    Start     Dose/Rate Route Frequency Ordered Stop   09/14/16  0945  ciprofloxacin (CIPRO) IVPB 400 mg     400 mg 200 mL/hr over 60 Minutes Intravenous To Radiology 09/14/16 0929 09/15/16 0945         Objective:   Vitals:   09/14/16 1026 09/14/16 1030 09/14/16 1035 09/14/16 1104  BP: (!) 165/85 (!) 163/82 (!) 147/75 (!) 142/63  Pulse: 86 85 85 82  Resp: 16 15 15 18   Temp:      TempSrc:      SpO2: 96% 94% (!) 89% 94%  Weight:      Height:        Wt Readings from Last 3 Encounters:  09/14/16 81.1 kg (178 lb 12.7 oz)  07/21/15 80.3 kg (177 lb)  01/28/10 93.4 kg (206 lb)     Intake/Output Summary (Last 24 hours) at 09/14/16 1121 Last data filed at 09/14/16 0913  Gross per 24 hour  Intake           858.75 ml  Output             1775 ml  Net          -916.25 ml     Physical Exam  Awake Alert, Oriented X 3, No new F.N deficits, Normal affect .AT,PERRAL Supple Neck,No JVD, No cervical lymphadenopathy appriciated.  Symmetrical Chest wall movement, Good air movement bilaterally, CTAB RRR,No Gallops,Rubs or new Murmurs, No Parasternal Heave +ve B.Sounds, No organomegaly appriciated, No rebound - guarding or rigidity.Abdomen is softer than yesterday, has positive urostomy and colostomy bags in place along with NG tube, No Cyanosis, has left BKA, right foot multiple toe amputations     Data Review:    CBC  Recent Labs Lab 09/13/16 1326 09/14/16 0414  WBC 9.2 8.3  HGB 12.7 12.1  HCT 40.5 38.7  PLT 244 248  MCV 92.3 92.6  MCH 28.9 28.9  MCHC 31.4 31.3  RDW 16.0* 16.3*    Chemistries   Recent Labs Lab 09/13/16 1326 09/14/16 0414  NA 139 142  K 4.4 4.0  CL 104 106  CO2 24 24  GLUCOSE 90 106*  BUN 20 18  CREATININE 0.81 0.75  CALCIUM 8.7* 8.6*  MG  --  1.9  AST 44*  --   ALT 22  --   ALKPHOS 166*  --   BILITOT 1.1  --    ------------------------------------------------------------------------------------------------------------------ No results for input(s): CHOL, HDL, LDLCALC, TRIG, CHOLHDL, LDLDIRECT in  the last 72 hours.  No results found for: HGBA1C ------------------------------------------------------------------------------------------------------------------  Recent Labs  09/13/16 1949  TSH 1.586   ------------------------------------------------------------------------------------------------------------------ No results for input(s): VITAMINB12, FOLATE, FERRITIN, TIBC, IRON, RETICCTPCT in the last 72 hours.  Coagulation profile  Recent Labs Lab 09/14/16 0414  INR 1.41    No results for input(s): DDIMER in the last 72 hours.  Cardiac Enzymes No results for input(s): CKMB, TROPONINI, MYOGLOBIN in the last 168 hours.  Invalid input(s): CK ------------------------------------------------------------------------------------------------------------------ No results found for: BNP  Micro Results Recent Results (from the past 240 hour(s))  MRSA PCR Screening     Status: None   Collection Time: 09/14/16  6:32 AM  Result Value Ref Range Status   MRSA by PCR NEGATIVE NEGATIVE Final    Comment:        The GeneXpert MRSA Assay (FDA approved for NASAL specimens only), is one component of a comprehensive MRSA colonization surveillance program. It is not intended to diagnose MRSA infection nor to guide or monitor treatment for MRSA infections.     Radiology Reports Ct Abdomen Pelvis W Contrast  Result Date: 09/13/2016 CLINICAL DATA:  Abdominal pain in various locations x5 days with nausea and constipation. Last bowel movement 6 days ago. No emesis. Soft nontender focus lateral to urostomy bag in the right lower quadrant. Small nontender mass right periumbilical region as well. EXAM: CT ABDOMEN AND PELVIS WITH CONTRAST TECHNIQUE: Multidetector CT imaging of the abdomen and pelvis was performed using the standard protocol following bolus administration of intravenous contrast. CONTRAST:  ISOVUE-300 IOPAMIDOL (ISOVUE-300) INJECTION 61% COMPARISON:  02/26/2009 FINDINGS:  Lower chest: Bibasilar subsegmental atelectasis and/or scarring. Included heart appears normal in size without pericardial effusion. Hepatobiliary: Hepatic steatosis. Stable hypodensity along the falciform ligament may reflect fatty infiltration. Status post cholecystectomy. No biliary dilatation. Pancreas: Unremarkable. No pancreatic ductal dilatation or surrounding inflammatory changes. Spleen: Normal in size without focal abnormality. Adrenals/Urinary Tract: There is an 8 x 8 x 9 mm left UPJ stone causing moderate left-sided hydronephrosis and perinephric fat stranding. Atrophic right kidney with 8 mm nonobstructing calculus. Diverting right lower quadrant urostomy with parastomal hernia containing a portion adjacent ascending colon as well as omental fat. Stomach/Bowel: Physiologically distended stomach. Normal small bowel rotation. Left lateral hernia is again noted containing fluid-filled distended jejunal loops measuring up to 4.5 cm in caliber as well as portions of nondistended descending colon containing stool. There appears to be a right lower quadrant transition point, series 2, image 69 contributing to a partial high-grade or early small bowel obstruction. Desiccated stool noted in the rectosigmoid. Vascular/Lymphatic: Aortic atherosclerosis without aneurysm or dissection. No lymphadenopathy by CT size criteria. Reproductive: Uterus and bilateral adnexa are unremarkable. Other: There is a 5 x 5 x 2.2 cm fat containing right periumbilical ventral hernia. Musculoskeletal: Dextroconvex curvature of the lower thoracic spine. Lower lumbar facet arthropathy. No acute osseous appearing abnormality. Developmental dysplasia of the left hip with remodeled appearance of the  left hip joint. IMPRESSION: 1. 8 x 8 x 9 mm left UPJ stone causing moderate left-sided hydronephrosis. Atrophic native right kidney with nonobstructing renal calculus. 2. Early or high grade partial SBO secondary to right lower quadrant  transition point possibly from an adhesion or short segmental stricture involving distal small intestine, series 2, image 69. 3. Right lower quadrant urostomy with new parastomal fat and large bowel containing hernia without evidence of incarceration. This may account for the reported soft tissue distention lateral to the urostomy. 4. Small fat containing right periumbilical ventral hernia. 5. Known left lateral small and large bowel containing hernia. 6. 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 7. Hepatic steatosis. 8. Cholecystectomy. Electronically Signed   By: Tollie Eth M.D.   On: 09/13/2016 15:17   Dg Chest Port 1 View  Result Date: 09/13/2016 CLINICAL DATA:  Shortness of breath. EXAM: PORTABLE CHEST 1 VIEW COMPARISON:  01/26/2016 and prior exam FINDINGS: Upper limits normal heart size again noted. Patchy right lower lung opacities are noted and may represent airspace disease/ pneumonia. Mild left basilar scarring/ atelectasis is unchanged. There is no evidence of pleural effusion or pneumothorax. A severe thoracic scoliosis is again noted. IMPRESSION: Patchy right lower lung opacities -question pneumonia. No other acute abnormality. Electronically Signed   By: Harmon Pier M.D.   On: 09/13/2016 16:50   Dg Abd Portable 1v-small Bowel Obstruction Protocol-initial, 8 Hr Delay  Result Date: 09/14/2016 CLINICAL DATA:  Small bowel obstruction.  8 hour delayed film. EXAM: PORTABLE ABDOMEN - 1 VIEW COMPARISON:  Radiographs and CT yesterday FINDINGS: Enteric tube in place. Administered enteric contrast within bowel loops in both the right and left abdomen. There may be contrast in the colon in the right abdomen versus dilated small bowel. Air-filled dilated small bowel in the central abdomen again seen. No free air. IMPRESSION: Enteric contrast possibly but not definitively seen within the right colon. Recommend 24 exam for confirmation. Dilated small  bowel in the central abdomen again seen. Electronically Signed   By: Rubye Oaks M.D.   On: 09/14/2016 03:11   Dg Abd Portable 1v-small Bowel Protocol-position Verification  Result Date: 09/13/2016 CLINICAL DATA:  NG tube placement EXAM: PORTABLE ABDOMEN - 1 VIEW COMPARISON:  None. FINDINGS: 1810 hours. NG tube tip is is positioned in the mid stomach. Contrast material in the left kidney and intrarenal collecting system is compatible with the CT scan earlier today. Mild fullness of the left intrarenal collecting system evident. Visualized bowel loops are diffusely gas distended. IMPRESSION: NG tube tip is in the mid stomach. Electronically Signed   By: Kennith Center M.D.   On: 09/13/2016 18:28   Ir Nephrostomy Placement Left  Result Date: 09/14/2016 INDICATION: 71 year old with an atrophic right kidney and obstructing left UPJ stone. Patient also has a history of cystectomy with an ileal conduit. Patient needs decompression of the left renal collecting system. EXAM: PLACEMENT OF LEFT PERCUTANEOUS NEPHROSTOMY TUBE WITH ULTRASOUND AND FLUOROSCOPIC GUIDANCE COMPARISON:  None. MEDICATIONS: Ciprofloxacin 400 mg IV; The antibiotic was administered in an appropriate time frame prior to skin puncture. ANESTHESIA/SEDATION: Fentanyl 1.0 mcg IV; Versed 25 mg IV Moderate Sedation Time:  13 minutes The patient was continuously monitored during the procedure by the interventional radiology nurse under my direct supervision. CONTRAST:  7 mL Isovue 300 - administered into the collecting system(s) FLUOROSCOPY TIME:  Fluoroscopy Time: 42 seconds, 11 mGy). COMPLICATIONS: None immediate. PROCEDURE: Informed written consent was obtained from  the patient after a thorough discussion of the procedural risks, benefits and alternatives. All questions were addressed. Maximal Sterile Barrier Technique was utilized including caps, mask, sterile gowns, sterile gloves, sterile drape, hand hygiene and skin antiseptic. A timeout was  performed prior to the initiation of the procedure. Patient was placed prone. Ultrasound was used to identify the left kidney. Left flank was prepped and draped in a sterile fashion. Skin was anesthetized with 1% lidocaine. Using ultrasound guidance, 21 gauge needle was directed into a mid/ lower pole calyx. Wire was easily advanced into the renal collecting system. An Accustick dilator set was placed. A J wire was placed and a 10 [place] multipurpose drain was reconstituted in the renal pelvis. Small contrast injection confirmed placement in the renal collecting system. Catheter was sutured to skin and attached to gravity bag. Fluoroscopic and ultrasound images were taken and saved for documentation. FINDINGS: Moderate left hydronephrosis. Nephrostomy tube coiled in the renal pelvis. IMPRESSION: Successful placement of a left percutaneous nephrostomy tube with ultrasound and fluoroscopic guidance. Electronically Signed   By: Richarda Overlie M.D.   On: 09/14/2016 11:10    Time Spent in minutes  30   Susa Raring M.D on 09/14/2016 at 11:21 AM  Between 7am to 7pm - Pager - [phone] ( page via [email or web address], text pages only, please mention full 10 digit call back number). After 7pm go to [email or web address] - password [email or web address]

## 2016-09-14 NOTE — Progress Notes (Signed)
Pharmacy Antibiotic Note  Deborah Young is a 71 y.o. female admitted on 09/13/2016 with pneumonia.  Pharmacy has been consulted for meropenem dosing.  Originally zosyn ordered but pt has an allergy to PCN from her [facility] with unknown reaction. Temp 101.5, cipro 400 mg IV x 1 dose 7/3 am.   Plan: Meropenem 1 gm IV q8  Height: 4\' 11"  (149.9 cm) Weight: 178 lb 12.7 oz (81.1 kg) IBW/kg (Calculated) : 43.2  Temp (24hrs), Avg:99.7 F (37.6 C), Min:98.7 F (37.1 C), Max:101.5 F (38.6 C)   Recent Labs Lab 09/13/16 1326 09/13/16 1657 09/14/16 0414  WBC 9.2  --  8.3  CREATININE 0.81  --  0.75  LATICACIDVEN  --  0.79  --     Estimated Creatinine Clearance: 61.2 mL/min (by C-G formula based on SCr of 0.75 mg/dL).    Allergies  Allergen Reactions  . Codeine   . Erythromycin Other (See Comments)    Per MAR  . Penicillins Other (See Comments)    Per MAR  Has patient had a PCN reaction causing immediate rash, facial/tongue/throat swelling, SOB or lightheadedness with hypotension: Unknown Has patient had a PCN reaction causing severe rash involving mucus membranes or skin necrosis: Unknown Has patient had a PCN reaction that required hospitalization: Unknown Has patient had a PCN reaction occurring within the last 10 years: Unknown If all of the above answers are "NO", then may proceed with Cephalosporin use.    Thank you for allowing pharmacy to be a part of this patient's care.  Herby AbrahamMichelle T. Arkel Cartwright, Pharm.D. [phone] 09/14/2016 6:46 PM

## 2016-09-14 NOTE — Progress Notes (Signed)
Pt received 1,250 out of 5,000 units of heparin subcutaneous last night at 2340. Nursing order to hold heparin until after nephrostomy tube placement 09/14/16 but heparin was still ordered to give. Pt did not receive entire dose last night. Heparin held this morning. Notified K. Schorr of [facility]. She stated that it was okay that pt received that dose last night since heparin has short half-life and to hold dose this morning.

## 2016-09-14 NOTE — Progress Notes (Signed)
Pt has NG tube in place.  Cpap held until NG tube removed per pt request/comfort.  Pt remains on 2lnc.  RT will continue to follow and assist with cpap needs when ready.

## 2016-09-14 NOTE — Significant Event (Signed)
Called by nurse regarding new fever in patient 101.5 this PM after going for left nephrostomy tube placement earlier this PM. VSS. Meropenem started by primary team, blood cultures sent.   Recommend ordering urine culture from nephrostomy tube, order placed and nursing aware. Fever after nephrostomy tube placement can be seen in obstructed system if subclinical infection present behind obstruction. Recommend following up urine culture, tailoring antibiotics per culture if infection present, close monitoring of vital signs, ensure nephrostomy tube continues to drain, gentle flushing PRN flushing of nephrostomy tube for decreased output.   Callie FieldingPauline Filippou, MD Urologic Surgery Resident

## 2016-09-14 NOTE — Progress Notes (Addendum)
Pt now has a fever 101.5.  Abdomen remains distended. Over all pt reports not feeling well. See flow sheet additional VS. Hospitalist  Provider,Surgeon and Urologist notified. Awaiting hospitalist orders.

## 2016-09-15 ENCOUNTER — Inpatient Hospital Stay (HOSPITAL_COMMUNITY): Payer: Medicare Other

## 2016-09-15 DIAGNOSIS — N132 Hydronephrosis with renal and ureteral calculous obstruction: Secondary | ICD-10-CM

## 2016-09-15 DIAGNOSIS — A419 Sepsis, unspecified organism: Secondary | ICD-10-CM

## 2016-09-15 DIAGNOSIS — K56609 Unspecified intestinal obstruction, unspecified as to partial versus complete obstruction: Secondary | ICD-10-CM

## 2016-09-15 DIAGNOSIS — N390 Urinary tract infection, site not specified: Secondary | ICD-10-CM

## 2016-09-15 LAB — BASIC METABOLIC PANEL
ANION GAP: 9 (ref 5–15)
BUN: 9 mg/dL (ref 6–20)
CO2: 30 mmol/L (ref 22–32)
Calcium: 8.6 mg/dL — ABNORMAL LOW (ref 8.9–10.3)
Chloride: 97 mmol/L — ABNORMAL LOW (ref 101–111)
Creatinine, Ser: 0.61 mg/dL (ref 0.44–1.00)
GFR calc Af Amer: >60 mL/min (ref >60)
GLUCOSE: 138 mg/dL — AB (ref 65–99)
Potassium: 3.6 mmol/L (ref 3.5–5.1)
Sodium: 136 mmol/L (ref 135–145)

## 2016-09-15 LAB — BLOOD CULTURE ID PANEL (REFLEXED)
Acinetobacter baumannii: NOT DETECTED
CANDIDA PARAPSILOSIS: NOT DETECTED
CANDIDA TROPICALIS: NOT DETECTED
CARBAPENEM RESISTANCE: NOT DETECTED
Candida albicans: NOT DETECTED
Candida glabrata: NOT DETECTED
Candida krusei: NOT DETECTED
Enterobacter cloacae complex: NOT DETECTED
Enterobacteriaceae species: DETECTED — AB
Enterococcus species: NOT DETECTED
Escherichia coli: DETECTED — AB
HAEMOPHILUS INFLUENZAE: NOT DETECTED
KLEBSIELLA PNEUMONIAE: NOT DETECTED
Klebsiella oxytoca: NOT DETECTED
Listeria monocytogenes: NOT DETECTED
Neisseria meningitidis: NOT DETECTED
PROTEUS SPECIES: NOT DETECTED
Pseudomonas aeruginosa: NOT DETECTED
SERRATIA MARCESCENS: NOT DETECTED
STAPHYLOCOCCUS AUREUS BCID: NOT DETECTED
STREPTOCOCCUS SPECIES: NOT DETECTED
Staphylococcus species: NOT DETECTED
Streptococcus agalactiae: NOT DETECTED
Streptococcus pneumoniae: NOT DETECTED
Streptococcus pyogenes: NOT DETECTED

## 2016-09-15 MED ORDER — DEXTROSE 5 % IV SOLN
2.0000 g | INTRAVENOUS | Status: DC
  Administered 2016-09-15 – 2016-09-20 (×6): 2 g via INTRAVENOUS
  Filled 2016-09-15 (×6): qty 2

## 2016-09-15 NOTE — Progress Notes (Addendum)
PROGRESS NOTE    Deborah Young  ZOX:[id number] DOB: [birth year] DOA: 09/13/2016 PCP: Nadara Eaton, MD    Brief Narrative:  71 year old female who presented with a chief complaint of constipation. She does have history of spina bifida, left BKA, right foot multiple toe amputations, diverging colostomy and urostomy, COPD, peripheral neuropathy, history of small bowel structures in the past requiring surgical intervention. She presented that gradually progressive abdominal pain for last 5-6 days prior to hospitalization, associated with nausea and lack of stool at her colostomy bag. On initial physical examination blood pressure 129/66, heart rate 77, temperature 98.5, respiratory rate 18, oxygen saturation 94%, her oral mucosa was moist, her neck was supple, her lungs were clear to auscultation bilaterally, heart S1-S2 present and rhythmic, her abdomen had hypoactive bowel sounds, positive distention,her colostomy bag was empty with no rebound or peritoneal sounds, no lower extremity edema. Left BKA with right foot multiple toe amputations. Imaging suggestive small bowel obstruction. Patient was admitted to the medical floor with a working diagnosis of small bowel obstruction, incidental UPJ stone with hydronephrosis. Patient had a NG tube placed to low intermittent suction, urology placed a left nephrostomy tube   Assessment & Plan:   Principal Problem:   SBO (small bowel obstruction) (HCC) Active Problems:   Essential hypertension, benign   CAD (coronary artery disease)   GERD   1. Partial small bowel obstruction. Will continue conservative medical care with NG tube to low intermittent suction, IV fluids, and IV antiemetics. Follow on surgical recommendations.    2. Left UPJ stone with hydronephrosis complicated with urine tract infection and gram negative sepsis (present on admission). Patient sp nephrostomy tube on the left, noted clear urine in the collecting bag. Positive urine  analysis for pyuria, with too numerous to count wbc, blood culture with e coli. Will continue high dose ceftriaxone 2 grams per day IV. Continue to follow on cell count, temperature curve and further cultures sensitivities. Pain control with fentanyl.   3. COPD, stable with no exacerbation, will continue oxymetry monitoring, avoid volume overload, continue dulera and tiotropiunm.   4. Spina bifida with morbid obesity. DVT prophylaxis and out of bed as tolerated.    5. Depression. Continue venlafaxine,    DVT prophylaxis: enoxaparin  Code Status: DNR Family Communication: No family at the bedside  Disposition Plan:    Consultants:   Urology   Surgery   Procedures:   Left nephrostomy tube    Antimicrobials:      Subjective: Patient with persistent abdominal pain, moderate in intensity, improved with pain medications, NG tube in place, with no nausea or vomiting. No back pain. No dyspnea or chest pain. No stool or flatus.   Objective: Vitals:   09/15/16 0110 09/15/16 0605 09/15/16 0614 09/15/16 0837  BP: (!) 157/84  (!) 145/70   Pulse: 97  79   Resp: 18  17   Temp: 99.8 F (37.7 C)  99.3 F (37.4 C)   TempSrc: Oral  Oral   SpO2: 97%  97% 97%  Weight:  84.3 kg (185 lb 13.6 oz)    Height:        Intake/Output Summary (Last 24 hours) at 09/15/16 1110 Last data filed at 09/15/16 0926  Gross per 24 hour  Intake           1362.5 ml  Output             2655 ml  Net          -  1292.5 ml   Filed Weights   09/14/16 0500 09/15/16 0605  Weight: 81.1 kg (178 lb 12.7 oz) 84.3 kg (185 lb 13.6 oz)    Examination:  General exam: deconditioned and ill looking appearing E ENT. Mild pallor, no icterus, oral mucosa moist. NG tube in place.  Respiratory system: Clear to auscultation. Respiratory effort normal. Mild decreased breath sounds at bases, no wheezing, rales or rhonchi.  Cardiovascular system: S1 & S2 heard, RRR. No JVD, murmurs, rubs, gallops or clicks. No pedal  edema. Gastrointestinal system: Abdomen is distended, soft and nontender. Colostomy bag in place, no stool. No organomegaly or masses felt. Decreased bowel sounds heard. Central nervous system: Alert and oriented. No focal neurological deficits. Extremities: Symmetric 5 x 5 power. Left BKA. Right foot toe amputations.  Skin: No rashes, lesions or ulcers     Data Reviewed: I have personally reviewed following labs and imaging studies  CBC:  Recent Labs Lab 09/13/16 1326 09/14/16 0414  WBC 9.2 8.3  HGB 12.7 12.1  HCT 40.5 38.7  MCV 92.3 92.6  PLT 244 248   Basic Metabolic Panel:  Recent Labs Lab 09/13/16 1326 09/14/16 0414  NA 139 142  K 4.4 4.0  CL 104 106  CO2 24 24  GLUCOSE 90 106*  BUN 20 18  CREATININE 0.81 0.75  CALCIUM 8.7* 8.6*  MG  --  1.9   GFR: Estimated Creatinine Clearance: 62.4 mL/min (by C-G formula based on SCr of 0.75 mg/dL). Liver Function Tests:  Recent Labs Lab 09/13/16 1326  AST 44*  ALT 22  ALKPHOS 166*  BILITOT 1.1  PROT 7.4  ALBUMIN 3.0*    Recent Labs Lab 09/13/16 1326  LIPASE 16   No results for input(s): AMMONIA in the last 168 hours. Coagulation Profile:  Recent Labs Lab 09/14/16 0414  INR 1.41   Cardiac Enzymes: No results for input(s): CKTOTAL, CKMB, CKMBINDEX, TROPONINI in the last 168 hours. BNP (last 3 results) No results for input(s): PROBNP in the last 8760 hours. HbA1C: No results for input(s): HGBA1C in the last 72 hours. CBG: No results for input(s): GLUCAP in the last 168 hours. Lipid Profile: No results for input(s): CHOL, HDL, LDLCALC, TRIG, CHOLHDL, LDLDIRECT in the last 72 hours. Thyroid Function Tests:  Recent Labs  09/13/16 1949  TSH 1.586   Anemia Panel: No results for input(s): VITAMINB12, FOLATE, FERRITIN, TIBC, IRON, RETICCTPCT in the last 72 hours. Sepsis Labs:  Recent Labs Lab 09/13/16 1657  LATICACIDVEN 0.79    Recent Results (from the past 240 hour(s))  MRSA PCR Screening      Status: None   Collection Time: 09/14/16  6:32 AM  Result Value Ref Range Status   MRSA by PCR NEGATIVE NEGATIVE Final    Comment:        The GeneXpert MRSA Assay (FDA approved for NASAL specimens only), is one component of a comprehensive MRSA colonization surveillance program. It is not intended to diagnose MRSA infection nor to guide or monitor treatment for MRSA infections.   Culture, blood (routine x 2)     Status: None (Preliminary result)   Collection Time: 09/14/16  6:53 PM  Result Value Ref Range Status   Specimen Description BLOOD RIGHT ARM  Final   Special Requests   Final    BOTTLES DRAWN AEROBIC AND ANAEROBIC Blood Culture adequate volume   Culture  Setup Time   Final    GRAM NEGATIVE RODS AEROBIC BOTTLE ONLY CRITICAL VALUE NOTED.  VALUE IS  CONSISTENT WITH PREVIOUSLY REPORTED AND CALLED VALUE.    Culture GRAM NEGATIVE RODS  Final   Report Status PENDING  Incomplete  Culture, blood (routine x 2)     Status: None (Preliminary result)   Collection Time: 09/14/16  6:53 PM  Result Value Ref Range Status   Specimen Description BLOOD RIGHT ARM  Final   Special Requests IN PEDIATRIC BOTTLE Blood Culture adequate volume  Final   Culture  Setup Time   Final    GRAM NEGATIVE RODS AEROBIC BOTTLE ONLY Organism ID to follow CRITICAL RESULT CALLED TO, READ BACK BY AND VERIFIED WITH: Mardee Postin.D. 10:05 09/15/16 ([email or web address])    Culture GRAM NEGATIVE RODS  Final   Report Status PENDING  Incomplete  Blood Culture ID Panel (Reflexed)     Status: Abnormal   Collection Time: 09/14/16  6:53 PM  Result Value Ref Range Status   Enterococcus species NOT DETECTED NOT DETECTED Final   Listeria monocytogenes NOT DETECTED NOT DETECTED Final   Staphylococcus species NOT DETECTED NOT DETECTED Final   Staphylococcus aureus NOT DETECTED NOT DETECTED Final   Streptococcus species NOT DETECTED NOT DETECTED Final   Streptococcus agalactiae NOT DETECTED NOT DETECTED Final   Streptococcus  pneumoniae NOT DETECTED NOT DETECTED Final   Streptococcus pyogenes NOT DETECTED NOT DETECTED Final   Acinetobacter baumannii NOT DETECTED NOT DETECTED Final   Enterobacteriaceae species DETECTED (A) NOT DETECTED Final    Comment: Enterobacteriaceae represent a large family of gram-negative bacteria, not a single organism. CRITICAL RESULT CALLED TO, READ BACK BY AND VERIFIED WITH: Azzie Glatter Pharm.D. 10:05 09/15/16 ([email or web address])    Enterobacter cloacae complex NOT DETECTED NOT DETECTED Final   Escherichia coli DETECTED (A) NOT DETECTED Final    Comment: CRITICAL RESULT CALLED TO, READ BACK BY AND VERIFIED WITH: Mardee Postin.D. 10:05 09/15/16 ([email or web address])    Klebsiella oxytoca NOT DETECTED NOT DETECTED Final   Klebsiella pneumoniae NOT DETECTED NOT DETECTED Final   Proteus species NOT DETECTED NOT DETECTED Final   Serratia marcescens NOT DETECTED NOT DETECTED Final   Carbapenem resistance NOT DETECTED NOT DETECTED Final   Haemophilus influenzae NOT DETECTED NOT DETECTED Final   Neisseria meningitidis NOT DETECTED NOT DETECTED Final   Pseudomonas aeruginosa NOT DETECTED NOT DETECTED Final   Candida albicans NOT DETECTED NOT DETECTED Final   Candida glabrata NOT DETECTED NOT DETECTED Final   Candida krusei NOT DETECTED NOT DETECTED Final   Candida parapsilosis NOT DETECTED NOT DETECTED Final   Candida tropicalis NOT DETECTED NOT DETECTED Final    Comment: Performed at [facility], [address] [place], [place] [address]         Radiology Studies: Ct Abdomen Pelvis W Contrast  Result Date: 09/13/2016 CLINICAL DATA:  Abdominal pain in various locations x5 days with nausea and constipation. Last bowel movement 6 days ago. No emesis. Soft nontender focus lateral to urostomy bag in the right lower quadrant. Small nontender mass right periumbilical region as well. EXAM: CT ABDOMEN AND PELVIS WITH CONTRAST TECHNIQUE: Multidetector CT imaging of the abdomen and pelvis was performed using the  standard protocol following bolus administration of intravenous contrast. CONTRAST:  ISOVUE-300 IOPAMIDOL (ISOVUE-300) INJECTION 61% COMPARISON:  02/26/2009 FINDINGS: Lower chest: Bibasilar subsegmental atelectasis and/or scarring. Included heart appears normal in size without pericardial effusion. Hepatobiliary: Hepatic steatosis. Stable hypodensity along the falciform ligament may reflect fatty infiltration. Status post cholecystectomy. No biliary dilatation. Pancreas: Unremarkable. No pancreatic ductal dilatation or surrounding inflammatory changes. Spleen: Normal  in size without focal abnormality. Adrenals/Urinary Tract: There is an 8 x 8 x 9 mm left UPJ stone causing moderate left-sided hydronephrosis and perinephric fat stranding. Atrophic right kidney with 8 mm nonobstructing calculus. Diverting right lower quadrant urostomy with parastomal hernia containing a portion adjacent ascending colon as well as omental fat. Stomach/Bowel: Physiologically distended stomach. Normal small bowel rotation. Left lateral hernia is again noted containing fluid-filled distended jejunal loops measuring up to 4.5 cm in caliber as well as portions of nondistended descending colon containing stool. There appears to be a right lower quadrant transition point, series 2, image 69 contributing to a partial high-grade or early small bowel obstruction. Desiccated stool noted in the rectosigmoid. Vascular/Lymphatic: Aortic atherosclerosis without aneurysm or dissection. No lymphadenopathy by CT size criteria. Reproductive: Uterus and bilateral adnexa are unremarkable. Other: There is a 5 x 5 x 2.2 cm fat containing right periumbilical ventral hernia. Musculoskeletal: Dextroconvex curvature of the lower thoracic spine. Lower lumbar facet arthropathy. No acute osseous appearing abnormality. 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 IMPRESSION: 1. 8 x 8 x 9 mm left UPJ stone causing  moderate left-sided hydronephrosis. Atrophic native right kidney with nonobstructing renal calculus. 2. Early or high grade partial SBO secondary to right lower quadrant transition point possibly from an adhesion or short segmental stricture involving distal small intestine, series 2, image 69. 3. Right lower quadrant urostomy with new parastomal fat and large bowel containing hernia without evidence of incarceration. This may account for the reported soft tissue distention lateral to the urostomy. 4. Small fat containing right periumbilical ventral hernia. 5. Known left lateral small and large bowel containing hernia. 6. 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 7. Hepatic steatosis. 8. Cholecystectomy. Electronically Signed   By: Tollie Eth M.D.   On: 09/13/2016 15:17   Dg Chest Port 1 View  Result Date: 09/14/2016 CLINICAL DATA:  Fever post nephrostomy tube. EXAM: PORTABLE CHEST 1 VIEW COMPARISON:  09/13/2016 FINDINGS: Bibasilar atelectasis. Heart is borderline in size. No effusions. NG tube is seen entering the stomach. IMPRESSION: Bibasilar atelectasis. Electronically Signed   By: Charlett Nose M.D.   On: 09/14/2016 19:30   Dg Chest Port 1 View  Result Date: 09/13/2016 CLINICAL DATA:  Shortness of breath. EXAM: PORTABLE CHEST 1 VIEW COMPARISON:  01/26/2016 and prior exam FINDINGS: Upper limits normal heart size again noted. Patchy right lower lung opacities are noted and may represent airspace disease/ pneumonia. Mild left basilar scarring/ atelectasis is unchanged. There is no evidence of pleural effusion or pneumothorax. A severe thoracic scoliosis is again noted. IMPRESSION: Patchy right lower lung opacities -question pneumonia. No other acute abnormality. Electronically Signed   By: Harmon Pier M.D.   On: 09/13/2016 16:50   Dg Abd Portable 1v  Result Date: 09/15/2016 CLINICAL DATA:  Small bowel obstruction EXAM: PORTABLE ABDOMEN - 1 VIEW  COMPARISON:  09/14/2016 FINDINGS: NG tube remains in the stomach. Oral contrast material noted throughout the colon. No dilated small bowel loops. Herniated colon in the right lower quadrant again noted as seen on prior CT. No free air organomegaly. Prior cholecystectomy. IMPRESSION: Contrast material noted throughout the colon, similar prior study. No current evidence of bowel obstruction. Electronically Signed   By: Charlett Nose M.D.   On: 09/15/2016 07:12   Dg Abd Portable 1v  Result Date: 09/14/2016 CLINICAL DATA:  71 year old female 24 hours status post enteric contrast administration via  NG tube for small bowel obstruction. Status post surgical nephrostomy tube placement today. EXAM: PORTABLE ABDOMEN - 1 VIEW COMPARISON:  0237 hours today, and earlier. FINDINGS: Portable AP view at 1900 hours. Stable enteric tube. Left side nephrostomy tube now in place. Large left lateral abdominal hernia as seen on the CT Abdomen and Pelvis yesterday. Oral contrast is present in the large bowel from the cecum to the descending colon level. Stool ball re - demonstrated in the rectum. No dilated small bowel loops are evident. Stable visualized osseous structures. IMPRESSION: 1. Oral contrast has definitely reached the colon. Enteric contrast throughout large bowel from the cecum to the descending colon. 2. No dilated small bowel loops are evident. 3. Stable enteric tube.  Left nephrostomy tube now in place. Electronically Signed   By: Odessa Fleming M.D.   On: 09/14/2016 19:36   Dg Abd Portable 1v-small Bowel Obstruction Protocol-initial, 8 Hr Delay  Result Date: 09/14/2016 CLINICAL DATA:  Small bowel obstruction.  8 hour delayed film. EXAM: PORTABLE ABDOMEN - 1 VIEW COMPARISON:  Radiographs and CT yesterday FINDINGS: Enteric tube in place. Administered enteric contrast within bowel loops in both the right and left abdomen. There may be contrast in the colon in the right abdomen versus dilated small bowel. Air-filled dilated  small bowel in the central abdomen again seen. No free air. IMPRESSION: Enteric contrast possibly but not definitively seen within the right colon. Recommend 24 exam for confirmation. Dilated small bowel in the central abdomen again seen. Electronically Signed   By: Rubye Oaks M.D.   On: 09/14/2016 03:11   Dg Abd Portable 1v-small Bowel Protocol-position Verification  Result Date: 09/13/2016 CLINICAL DATA:  NG tube placement EXAM: PORTABLE ABDOMEN - 1 VIEW COMPARISON:  None. FINDINGS: 1810 hours. NG tube tip is is positioned in the mid stomach. Contrast material in the left kidney and intrarenal collecting system is compatible with the CT scan earlier today. Mild fullness of the left intrarenal collecting system evident. Visualized bowel loops are diffusely gas distended. IMPRESSION: NG tube tip is in the mid stomach. Electronically Signed   By: Kennith Center M.D.   On: 09/13/2016 18:28   Ir Nephrostomy Placement Left  Result Date: 09/14/2016 INDICATION: 72 year old with an atrophic right kidney and obstructing left UPJ stone. Patient also has a history of cystectomy with an ileal conduit. Patient needs decompression of the left renal collecting system. EXAM: PLACEMENT OF LEFT PERCUTANEOUS NEPHROSTOMY TUBE WITH ULTRASOUND AND FLUOROSCOPIC GUIDANCE COMPARISON:  None. MEDICATIONS: Ciprofloxacin 400 mg IV; The antibiotic was administered in an appropriate time frame prior to skin puncture. ANESTHESIA/SEDATION: Fentanyl 1.0 mcg IV; Versed 25 mg IV Moderate Sedation Time:  13 minutes The patient was continuously monitored during the procedure by the interventional radiology nurse under my direct supervision. CONTRAST:  7 mL Isovue 300 - administered into the collecting system(s) FLUOROSCOPY TIME:  Fluoroscopy Time: 42 seconds, 11 mGy). COMPLICATIONS: None immediate. PROCEDURE: Informed written consent was obtained from the patient after a thorough discussion of the procedural risks, benefits and alternatives.  All questions were addressed. Maximal Sterile Barrier Technique was utilized including caps, mask, sterile gowns, sterile gloves, sterile drape, hand hygiene and skin antiseptic. A timeout was performed prior to the initiation of the procedure. Patient was placed prone. Ultrasound was used to identify the left kidney. Left flank was prepped and draped in a sterile fashion. Skin was anesthetized with 1% lidocaine. Using ultrasound guidance, 21 gauge needle was directed into a mid/ lower pole calyx. Wire  was easily advanced into the renal collecting system. An Accustick dilator set was placed. A J wire was placed and a 10 [place] multipurpose drain was reconstituted in the renal pelvis. Small contrast injection confirmed placement in the renal collecting system. Catheter was sutured to skin and attached to gravity bag. Fluoroscopic and ultrasound images were taken and saved for documentation. FINDINGS: Moderate left hydronephrosis. Nephrostomy tube coiled in the renal pelvis. IMPRESSION: Successful placement of a left percutaneous nephrostomy tube with ultrasound and fluoroscopic guidance. Electronically Signed   By: Richarda OverlieAdam  Henn M.D.   On: 09/14/2016 11:10        Scheduled Meds: . artificial tears   Both Eyes QHS  . chlorhexidine  15 mL Mouth/Throat BID  . heparin  5,000 Units Subcutaneous Q8H  . mometasone-formoterol  2 puff Inhalation BID  . polyvinyl alcohol  1 drop Both Eyes QID  . pregabalin  150 mg Oral BID  . tiotropium  18 mcg Inhalation Daily  . venlafaxine XR  150 mg Oral Q breakfast  . venlafaxine XR  75 mg Oral Q breakfast   Continuous Infusions: . cefTRIAXone (ROCEPHIN)  IV    . dextrose 5 % with KCl 20 mEq / L 20 mEq (09/15/16 0949)  . diphenhydrAMINE (BENADRYL) IVPB(SICKLE CELL ONLY)       LOS: 2 days       Mauricio Annett Gulaaniel Arrien, MD [facility] Pager [phone]  If 7PM-7AM, please contact night-coverage [email or web address] Password [email or web address] 09/15/2016, 11:10 AM

## 2016-09-15 NOTE — Progress Notes (Signed)
Cpap held while NG tube in place per pt request/comfort.  Pt remains on 2lnc.  RT will continue to follow and assist with cpap when pt is ready.

## 2016-09-15 NOTE — Progress Notes (Signed)
Assessment Principal Problem:   Partial SBO (small bowel obstruction) (HCC)-slowly improving   Plan:  Continue ng until bowel function more consistent.   LOS: 2 days        Chief Complaint/Subjective: Had some gas out colostomy yesterday.  None over night.  Objective: Vital signs in last 24 hours: Temp:  [98.7 F (37.1 C)-101.5 F (38.6 C)] 99.3 F (37.4 C) (07/04 0614) Pulse Rate:  [76-97] 79 (07/04 0614) Resp:  [15-18] 17 (07/04 0614) BP: (128-180)/(59-86) 145/70 (07/04 0614) SpO2:  [89 %-100 %] 97 % (07/04 0614) Weight:  [84.3 kg (185 lb 13.6 oz)] 84.3 kg (185 lb 13.6 oz) (07/04 0605) Last BM Date: 09/07/16  Intake/Output from previous day: 07/03 0701 - 07/04 0700 In: 1362.5 [P.O.:40; I.V.:1092.5; NG/GT:30; IV Piggyback:200] Out: 2860 [Urine:2335; Emesis/NG output:525] Intake/Output this shift: No intake/output data recorded.  PE: General- In NAD.  Awake and alert. Abdomen-soft, obese, midline scar, multiple palpable hernias, right urostomy, left colostomy with small piece of firm stool in ostomy.  Lab Results:   Recent Labs  09/13/16 1326 09/14/16 0414  WBC 9.2 8.3  HGB 12.7 12.1  HCT 40.5 38.7  PLT 244 248   BMET  Recent Labs  09/13/16 1326 09/14/16 0414  NA 139 142  K 4.4 4.0  CL 104 106  CO2 24 24  GLUCOSE 90 106*  BUN 20 18  CREATININE 0.81 0.75  CALCIUM 8.7* 8.6*   PT/INR  Recent Labs  09/14/16 0414  LABPROT 17.3*  INR 1.41   Comprehensive Metabolic Panel:    Component Value Date/Time   NA 142 09/14/2016 0414   NA 139 09/13/2016 1326   K 4.0 09/14/2016 0414   K 4.4 09/13/2016 1326   CL 106 09/14/2016 0414   CL 104 09/13/2016 1326   CO2 24 09/14/2016 0414   CO2 24 09/13/2016 1326   BUN 18 09/14/2016 0414   BUN 20 09/13/2016 1326   CREATININE 0.75 09/14/2016 0414   CREATININE 0.81 09/13/2016 1326   GLUCOSE 106 (H) 09/14/2016 0414   GLUCOSE 90 09/13/2016 1326   CALCIUM 8.6 (L) 09/14/2016 0414   CALCIUM 8.7 (L) 09/13/2016  1326   AST 44 (H) 09/13/2016 1326   AST 23 01/15/2010 0245   ALT 22 09/13/2016 1326   ALT 23 01/15/2010 0245   ALKPHOS 166 (H) 09/13/2016 1326   ALKPHOS 96 01/15/2010 0245   BILITOT 1.1 09/13/2016 1326   BILITOT 0.4 01/15/2010 0245   PROT 7.4 09/13/2016 1326   PROT 6.8 01/15/2010 0245   ALBUMIN 3.0 (L) 09/13/2016 1326   ALBUMIN 3.2 (L) 01/15/2010 0245     Studies/Results: Ct Abdomen Pelvis W Contrast  Result Date: 09/13/2016 CLINICAL DATA:  Abdominal pain in various locations x5 days with nausea and constipation. Last bowel movement 6 days ago. No emesis. Soft nontender focus lateral to urostomy bag in the right lower quadrant. Small nontender mass right periumbilical region as well. EXAM: CT ABDOMEN AND PELVIS WITH CONTRAST TECHNIQUE: Multidetector CT imaging of the abdomen and pelvis was performed using the standard protocol following bolus administration of intravenous contrast. CONTRAST:  ISOVUE-300 IOPAMIDOL (ISOVUE-300) INJECTION 61% COMPARISON:  02/26/2009 FINDINGS: Lower chest: Bibasilar subsegmental atelectasis and/or scarring. Included heart appears normal in size without pericardial effusion. Hepatobiliary: Hepatic steatosis. Stable hypodensity along the falciform ligament may reflect fatty infiltration. Status post cholecystectomy. No biliary dilatation. Pancreas: Unremarkable. No pancreatic ductal dilatation or surrounding inflammatory changes. Spleen: Normal in size without focal abnormality. Adrenals/Urinary Tract: There is an  8 x 8 x 9 mm left UPJ stone causing moderate left-sided hydronephrosis and perinephric fat stranding. Atrophic right kidney with 8 mm nonobstructing calculus. Diverting right lower quadrant urostomy with parastomal hernia containing a portion adjacent ascending colon as well as omental fat. Stomach/Bowel: Physiologically distended stomach. Normal small bowel rotation. Left lateral hernia is again noted containing fluid-filled distended jejunal loops  measuring up to 4.5 cm in caliber as well as portions of nondistended descending colon containing stool. There appears to be a right lower quadrant transition point, series 2, image 69 contributing to a partial high-grade or early small bowel obstruction. Desiccated stool noted in the rectosigmoid. Vascular/Lymphatic: Aortic atherosclerosis without aneurysm or dissection. No lymphadenopathy by CT size criteria. Reproductive: Uterus and bilateral adnexa are unremarkable. Other: There is a 5 x 5 x 2.2 cm fat containing right periumbilical ventral hernia. Musculoskeletal: Dextroconvex curvature of the lower thoracic spine. Lower lumbar facet arthropathy. No acute osseous appearing abnormality. 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 IMPRESSION: 1. 8 x 8 x 9 mm left UPJ stone causing moderate left-sided hydronephrosis. Atrophic native right kidney with nonobstructing renal calculus. 2. Early or high grade partial SBO secondary to right lower quadrant transition point possibly from an adhesion or short segmental stricture involving distal small intestine, series 2, image 69. 3. Right lower quadrant urostomy with new parastomal fat and large bowel containing hernia without evidence of incarceration. This may account for the reported soft tissue distention lateral to the urostomy. 4. Small fat containing right periumbilical ventral hernia. 5. Known left lateral small and large bowel containing hernia. 6. 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 7. Hepatic steatosis. 8. Cholecystectomy. Electronically Signed   By: Tollie Eth M.D.   On: 09/13/2016 15:17   Dg Chest Port 1 View  Result Date: 09/14/2016 CLINICAL DATA:  Fever post nephrostomy tube. EXAM: PORTABLE CHEST 1 VIEW COMPARISON:  09/13/2016 FINDINGS: Bibasilar atelectasis. Heart is borderline in size. No effusions. NG tube is seen entering the stomach.  IMPRESSION: Bibasilar atelectasis. Electronically Signed   By: Charlett Nose M.D.   On: 09/14/2016 19:30   Dg Chest Port 1 View  Result Date: 09/13/2016 CLINICAL DATA:  Shortness of breath. EXAM: PORTABLE CHEST 1 VIEW COMPARISON:  01/26/2016 and prior exam FINDINGS: Upper limits normal heart size again noted. Patchy right lower lung opacities are noted and may represent airspace disease/ pneumonia. Mild left basilar scarring/ atelectasis is unchanged. There is no evidence of pleural effusion or pneumothorax. A severe thoracic scoliosis is again noted. IMPRESSION: Patchy right lower lung opacities -question pneumonia. No other acute abnormality. Electronically Signed   By: Harmon Pier M.D.   On: 09/13/2016 16:50   Dg Abd Portable 1v  Result Date: 09/15/2016 CLINICAL DATA:  Small bowel obstruction EXAM: PORTABLE ABDOMEN - 1 VIEW COMPARISON:  09/14/2016 FINDINGS: NG tube remains in the stomach. Oral contrast material noted throughout the colon. No dilated small bowel loops. Herniated colon in the right lower quadrant again noted as seen on prior CT. No free air organomegaly. Prior cholecystectomy. IMPRESSION: Contrast material noted throughout the colon, similar prior study. No current evidence of bowel obstruction. Electronically Signed   By: Charlett Nose M.D.   On: 09/15/2016 07:12   Dg Abd Portable 1v  Result Date: 09/14/2016 CLINICAL DATA:  71 year old female 24 hours status post enteric contrast administration via NG tube for small bowel obstruction. Status post surgical nephrostomy  tube placement today. EXAM: PORTABLE ABDOMEN - 1 VIEW COMPARISON:  0237 hours today, and earlier. FINDINGS: Portable AP view at 1900 hours. Stable enteric tube. Left side nephrostomy tube now in place. Large left lateral abdominal hernia as seen on the CT Abdomen and Pelvis yesterday. Oral contrast is present in the large bowel from the cecum to the descending colon level. Stool ball re - demonstrated in the rectum. No dilated  small bowel loops are evident. Stable visualized osseous structures. IMPRESSION: 1. Oral contrast has definitely reached the colon. Enteric contrast throughout large bowel from the cecum to the descending colon. 2. No dilated small bowel loops are evident. 3. Stable enteric tube.  Left nephrostomy tube now in place. Electronically Signed   By: Odessa Fleming M.D.   On: 09/14/2016 19:36   Dg Abd Portable 1v-small Bowel Obstruction Protocol-initial, 8 Hr Delay  Result Date: 09/14/2016 CLINICAL DATA:  Small bowel obstruction.  8 hour delayed film. EXAM: PORTABLE ABDOMEN - 1 VIEW COMPARISON:  Radiographs and CT yesterday FINDINGS: Enteric tube in place. Administered enteric contrast within bowel loops in both the right and left abdomen. There may be contrast in the colon in the right abdomen versus dilated small bowel. Air-filled dilated small bowel in the central abdomen again seen. No free air. IMPRESSION: Enteric contrast possibly but not definitively seen within the right colon. Recommend 24 exam for confirmation. Dilated small bowel in the central abdomen again seen. Electronically Signed   By: Rubye Oaks M.D.   On: 09/14/2016 03:11   Dg Abd Portable 1v-small Bowel Protocol-position Verification  Result Date: 09/13/2016 CLINICAL DATA:  NG tube placement EXAM: PORTABLE ABDOMEN - 1 VIEW COMPARISON:  None. FINDINGS: 1810 hours. NG tube tip is is positioned in the mid stomach. Contrast material in the left kidney and intrarenal collecting system is compatible with the CT scan earlier today. Mild fullness of the left intrarenal collecting system evident. Visualized bowel loops are diffusely gas distended. IMPRESSION: NG tube tip is in the mid stomach. Electronically Signed   By: Kennith Center M.D.   On: 09/13/2016 18:28   Ir Nephrostomy Placement Left  Result Date: 09/14/2016 INDICATION: 71 year old with an atrophic right kidney and obstructing left UPJ stone. Patient also has a history of cystectomy with an  ileal conduit. Patient needs decompression of the left renal collecting system. EXAM: PLACEMENT OF LEFT PERCUTANEOUS NEPHROSTOMY TUBE WITH ULTRASOUND AND FLUOROSCOPIC GUIDANCE COMPARISON:  None. MEDICATIONS: Ciprofloxacin 400 mg IV; The antibiotic was administered in an appropriate time frame prior to skin puncture. ANESTHESIA/SEDATION: Fentanyl 1.0 mcg IV; Versed 25 mg IV Moderate Sedation Time:  13 minutes The patient was continuously monitored during the procedure by the interventional radiology nurse under my direct supervision. CONTRAST:  7 mL Isovue 300 - administered into the collecting system(s) FLUOROSCOPY TIME:  Fluoroscopy Time: 42 seconds, 11 mGy). COMPLICATIONS: None immediate. PROCEDURE: Informed written consent was obtained from the patient after a thorough discussion of the procedural risks, benefits and alternatives. All questions were addressed. Maximal Sterile Barrier Technique was utilized including caps, mask, sterile gowns, sterile gloves, sterile drape, hand hygiene and skin antiseptic. A timeout was performed prior to the initiation of the procedure. Patient was placed prone. Ultrasound was used to identify the left kidney. Left flank was prepped and draped in a sterile fashion. Skin was anesthetized with 1% lidocaine. Using ultrasound guidance, 21 gauge needle was directed into a mid/ lower pole calyx. Wire was easily advanced into the renal collecting system. An Accustick  dilator set was placed. A J wire was placed and a 10 [place] multipurpose drain was reconstituted in the renal pelvis. Small contrast injection confirmed placement in the renal collecting system. Catheter was sutured to skin and attached to gravity bag. Fluoroscopic and ultrasound images were taken and saved for documentation. FINDINGS: Moderate left hydronephrosis. Nephrostomy tube coiled in the renal pelvis. IMPRESSION: Successful placement of a left percutaneous nephrostomy tube with ultrasound and fluoroscopic guidance.  Electronically Signed   By: Richarda OverlieAdam  Henn M.D.   On: 09/14/2016 11:10    Anti-infectives: Anti-infectives    Start     Dose/Rate Route Frequency Ordered Stop   09/14/16 2000  meropenem (MERREM) 1 g in sodium chloride 0.9 % 100 mL IVPB     1 g 200 mL/hr over 30 Minutes Intravenous Every 8 hours 09/14/16 1846     09/14/16 0945  ciprofloxacin (CIPRO) IVPB 400 mg     400 mg 200 mL/hr over 60 Minutes Intravenous To Radiology 09/14/16 0929 09/14/16 1124       Nasia Cannan J 09/15/2016

## 2016-09-15 NOTE — Progress Notes (Signed)
CSW following to assist with discharge back to SNF, once pt. is medically stable.

## 2016-09-15 NOTE — Progress Notes (Addendum)
PHARMACY - PHYSICIAN COMMUNICATION CRITICAL VALUE ALERT - BLOOD CULTURE IDENTIFICATION (BCID)  Results for orders placed or performed during the hospital encounter of 09/13/16  Blood Culture ID Panel (Reflexed) (Collected: 09/14/2016  6:53 PM)  Result Value Ref Range   Enterococcus species NOT DETECTED NOT DETECTED   Listeria monocytogenes NOT DETECTED NOT DETECTED   Staphylococcus species NOT DETECTED NOT DETECTED   Staphylococcus aureus NOT DETECTED NOT DETECTED   Streptococcus species NOT DETECTED NOT DETECTED   Streptococcus agalactiae NOT DETECTED NOT DETECTED   Streptococcus pneumoniae NOT DETECTED NOT DETECTED   Streptococcus pyogenes NOT DETECTED NOT DETECTED   Acinetobacter baumannii NOT DETECTED NOT DETECTED   Enterobacteriaceae species DETECTED (A) NOT DETECTED   Enterobacter cloacae complex NOT DETECTED NOT DETECTED   Escherichia coli DETECTED (A) NOT DETECTED   Klebsiella oxytoca NOT DETECTED NOT DETECTED   Klebsiella pneumoniae NOT DETECTED NOT DETECTED   Proteus species NOT DETECTED NOT DETECTED   Serratia marcescens NOT DETECTED NOT DETECTED   Carbapenem resistance NOT DETECTED NOT DETECTED   Haemophilus influenzae NOT DETECTED NOT DETECTED   Neisseria meningitidis NOT DETECTED NOT DETECTED   Pseudomonas aeruginosa NOT DETECTED NOT DETECTED   Candida albicans NOT DETECTED NOT DETECTED   Candida glabrata NOT DETECTED NOT DETECTED   Candida krusei NOT DETECTED NOT DETECTED   Candida parapsilosis NOT DETECTED NOT DETECTED   Candida tropicalis NOT DETECTED NOT DETECTED    Name of physician (or Provider) Contacted: Dr. Ella JubileeArrien  Changes to prescribed antibiotics required: change merrem to ceftriaxone 2gm IV q24.  - Of note, patient has PCN listed in allergy field with unknown rxn.  Dr. Ilda FoilArrian is aware.  RN informed to monitor pt closely with first dose of ceftriaxone. Current ceftriaxone dose is appropriate for indication and no renal adj is needed with abx.  Pharmacy will  sign off.  Lucia Gaskinsham, Rohail Klees P 09/15/2016  10:53 AM

## 2016-09-16 DIAGNOSIS — N201 Calculus of ureter: Secondary | ICD-10-CM

## 2016-09-16 DIAGNOSIS — R7881 Bacteremia: Secondary | ICD-10-CM

## 2016-09-16 DIAGNOSIS — I10 Essential (primary) hypertension: Secondary | ICD-10-CM

## 2016-09-16 LAB — CBC WITH DIFFERENTIAL/PLATELET
BASOS PCT: 0 %
Basophils Absolute: 0 10*3/uL (ref 0.0–0.1)
EOS ABS: 0.3 10*3/uL (ref 0.0–0.7)
EOS PCT: 3 %
HCT: 38.1 % (ref 36.0–46.0)
HEMOGLOBIN: 12.3 g/dL (ref 12.0–15.0)
LYMPHS ABS: 1.9 10*3/uL (ref 0.7–4.0)
Lymphocytes Relative: 17 %
MCH: 29.4 pg (ref 26.0–34.0)
MCHC: 32.3 g/dL (ref 30.0–36.0)
MCV: 90.9 fL (ref 78.0–100.0)
MONO ABS: 0.7 10*3/uL (ref 0.1–1.0)
MONOS PCT: 6 %
Neutro Abs: 8.4 10*3/uL — ABNORMAL HIGH (ref 1.7–7.7)
Neutrophils Relative %: 74 %
PLATELETS: 242 10*3/uL (ref 150–400)
RBC: 4.19 MIL/uL (ref 3.87–5.11)
RDW: 15.8 % — AB (ref 11.5–15.5)
WBC: 11.4 10*3/uL — ABNORMAL HIGH (ref 4.0–10.5)

## 2016-09-16 LAB — BASIC METABOLIC PANEL
Anion gap: 9 (ref 5–15)
BUN: 9 mg/dL (ref 6–20)
CALCIUM: 8.8 mg/dL — AB (ref 8.9–10.3)
CHLORIDE: 95 mmol/L — AB (ref 101–111)
CO2: 32 mmol/L (ref 22–32)
CREATININE: 0.54 mg/dL (ref 0.44–1.00)
GFR calc Af Amer: >60 mL/min (ref >60)
GFR calc non Af Amer: >60 mL/min (ref >60)
Glucose, Bld: 125 mg/dL — ABNORMAL HIGH (ref 65–99)
Potassium: 3.7 mmol/L (ref 3.5–5.1)
SODIUM: 136 mmol/L (ref 135–145)

## 2016-09-16 MED ORDER — POTASSIUM CHLORIDE 20 MEQ PO PACK: 40.0000 meq | PACK | Freq: Once | ORAL | Status: DC

## 2016-09-16 MED ORDER — POTASSIUM CHLORIDE CRYS ER 20 MEQ PO TBCR
40.0000 meq | EXTENDED_RELEASE_TABLET | Freq: Once | ORAL | Status: AC
  Administered 2016-09-16: 12:00:00 40 meq via ORAL

## 2016-09-16 NOTE — Progress Notes (Signed)
Subjective/Chief Complaint: Feels better today. Xray shows contrast in colon   Objective: Vital signs in last 24 hours: Temp:  [99.3 F (37.4 C)-100.2 F (37.9 C)] 100.2 F (37.9 C) (07/05 16100632) Pulse Rate:  [77-81] 81 (07/05 0632) Resp:  [16] 16 (07/05 0632) BP: (132-151)/(57-64) 132/57 (07/05 0632) SpO2:  [91 %-96 %] 91 % (07/05 0813) Last BM Date: 09/07/16  Intake/Output from previous day: 07/04 0701 - 07/05 0700 In: 1451.3 [I.V.:941.3] Out: 2245 [Urine:2195; Emesis/NG output:50] Intake/Output this shift: No intake/output data recorded.  General appearance: alert and cooperative Resp: clear to auscultation bilaterally Cardio: regular rate and rhythm GI: soft, much less tender. ostomy with minimal output  Lab Results:   Recent Labs  09/14/16 0414 09/16/16 0500  WBC 8.3 11.4*  HGB 12.1 12.3  HCT 38.7 38.1  PLT 248 242   BMET  Recent Labs  09/15/16 1238 09/16/16 0500  NA 136 136  K 3.6 3.7  CL 97* 95*  CO2 30 32  GLUCOSE 138* 125*  BUN 9 9  CREATININE 0.61 0.54  CALCIUM 8.6* 8.8*   PT/INR  Recent Labs  09/14/16 0414  LABPROT 17.3*  INR 1.41   ABG No results for input(s): PHART, HCO3 in the last 72 hours.  Invalid input(s): PCO2, PO2  Studies/Results: Dg Chest Port 1 View  Result Date: 09/14/2016 CLINICAL DATA:  Fever post nephrostomy tube. EXAM: PORTABLE CHEST 1 VIEW COMPARISON:  09/13/2016 FINDINGS: Bibasilar atelectasis. Heart is borderline in size. No effusions. NG tube is seen entering the stomach. IMPRESSION: Bibasilar atelectasis. Electronically Signed   By: Charlett NoseKevin  Dover M.D.   On: 09/14/2016 19:30   Dg Abd Portable 1v  Result Date: 09/15/2016 CLINICAL DATA:  Small bowel obstruction EXAM: PORTABLE ABDOMEN - 1 VIEW COMPARISON:  09/14/2016 FINDINGS: NG tube remains in the stomach. Oral contrast material noted throughout the colon. No dilated small bowel loops. Herniated colon in the right lower quadrant again noted as seen on prior CT.  No free air organomegaly. Prior cholecystectomy. IMPRESSION: Contrast material noted throughout the colon, similar prior study. No current evidence of bowel obstruction. Electronically Signed   By: Charlett NoseKevin  Dover M.D.   On: 09/15/2016 07:12   Dg Abd Portable 1v  Result Date: 09/14/2016 CLINICAL DATA:  71 year old female 24 hours status post enteric contrast administration via NG tube for small bowel obstruction. Status post surgical nephrostomy tube placement today. EXAM: PORTABLE ABDOMEN - 1 VIEW COMPARISON:  0237 hours today, and earlier. FINDINGS: Portable AP view at 1900 hours. Stable enteric tube. Left side nephrostomy tube now in place. Large left lateral abdominal hernia as seen on the CT Abdomen and Pelvis yesterday. Oral contrast is present in the large bowel from the cecum to the descending colon level. Stool ball re - demonstrated in the rectum. No dilated small bowel loops are evident. Stable visualized osseous structures. IMPRESSION: 1. Oral contrast has definitely reached the colon. Enteric contrast throughout large bowel from the cecum to the descending colon. 2. No dilated small bowel loops are evident. 3. Stable enteric tube.  Left nephrostomy tube now in place. Electronically Signed   By: Odessa FlemingH  Hall M.D.   On: 09/14/2016 19:36    Anti-infectives: Anti-infectives    Start     Dose/Rate Route Frequency Ordered Stop   09/15/16 1100  cefTRIAXone (ROCEPHIN) 2 g in dextrose 5 % 50 mL IVPB     2 g 100 mL/hr over 30 Minutes Intravenous Every 24 hours 09/15/16 1050  09/14/16 2000  meropenem (MERREM) 1 g in sodium chloride 0.9 % 100 mL IVPB  Status:  Discontinued     1 g 200 mL/hr over 30 Minutes Intravenous Every 8 hours 09/14/16 1846 09/15/16 1050   09/14/16 0945  ciprofloxacin (CIPRO) IVPB 400 mg     400 mg 200 mL/hr over 60 Minutes Intravenous To Radiology 09/14/16 0929 09/14/16 1124      Assessment/Plan: s/p * No surgery found * Continue bowel rest until there is output from ostomy   Ambulate sbo seems to be slowly improving  LOS: 3 days    TOTH III,Khai Torbert S 09/16/2016

## 2016-09-16 NOTE — Progress Notes (Signed)
PROGRESS NOTE    Deborah Young  ZOX:[id number] DOB: 12-Aug-1945 DOA: 09/13/2016 PCP: Nadara Eaton, MD     Brief Narrative:  71 year old female who presented with a chief complaint of constipation. She does have history of spina bifida, status post cystectomy and conduit diversion, left BKA, right foot multiple toe amputations, diverging colostomy and urostomy, COPD, peripheral neuropathy, history of small bowel structures in the past requiring surgical intervention. She presented that gradually progressive abdominal pain for last 5-6 days prior to hospitalization, associated with nausea and lack of stool at her colostomy bag. On initial physical examination blood pressure 129/66, heart rate 77, temperature 98.5, respiratory rate 18, oxygen saturation 94%, her oral mucosa was moist, her neck was supple, her lungs were clear to auscultation bilaterally, heart S1-S2 present and rhythmic, her abdomen had hypoactive bowel sounds, positive distention,her colostomy bag was empty with no rebound or peritoneal sounds, no lower extremity edema. Left BKA with right foot multiple toe amputations. Imaging suggestive small bowel obstruction. Patient was admitted to the medical floor with a working diagnosis of small bowel obstruction, incidental UPJ stone with hydronephrosis. Patient had a NG tube placed to low intermittent suction, urology placed a left nephrostomy tube   Assessment & Plan:   Principal Problem:   SBO (small bowel obstruction) (HCC) Active Problems:   Essential hypertension, benign   CAD (coronary artery disease)   GERD   1. Partial small bowel obstruction. NG tube has been removed, with no further nausea or vomiting, no worsening abdominal pain. Ostomy bag with visible stool. Will keep patient npo for now and will continue maintenance IV fluids, and as needed IV antiemetics. Surgery as consultation.    2. Left UPJ stone with hydronephrosis complicated with urine tract infection and  gram negative sepsis (present on admission). Patient sp nephrostomy tube on the left, with clear urine in collecting bag, renal function with preserved with serum cr at 0.54. K at 3,7 with Na 136 and serum bicarbonate 32. Patient with solitary left kidney, high risk for obstruction, with recommendations for left neph tube in am. Continue ceftriaxone IV #1, will repeat blood cultures.   3. COPD, continue to be stable with no signs of acute exacerbation, continue bronchodilator therapy with dulera and tiotropiunm.   4. Spina bifida sp cystectomy/ morbid obesity. DVT prophylaxis.   5. Depression. Continue venlafaxine, no agitation or confusion.    DVT prophylaxis: enoxaparin  Code Status: DNR Family Communication: No family at the bedside  Disposition Plan:    Consultants:   Urology   Surgery   Procedures:   Left nephrostomy tube    Antimicrobials:     Subjective: Patient has lost NG tube, no further nausea or vomiting, no abdominal pain. No dyspnea or chest pain. Positive gas and stool at colostomy bag.   Objective: Vitals:   09/15/16 1859 09/15/16 2102 09/16/16 0632 09/16/16 0813  BP:  (!) 142/62 (!) 132/57   Pulse:  81 81   Resp:  16 16   Temp:  99.9 F (37.7 C) 100.2 F (37.9 C)   TempSrc:  Oral Oral   SpO2: 93% 96% 93% 91%  Weight:      Height:        Intake/Output Summary (Last 24 hours) at 09/16/16 1045 Last data filed at 09/16/16 0901  Gross per 24 hour  Intake          1451.25 ml  Output  1925 ml  Net          -473.75 ml   Filed Weights   09/14/16 0500 09/15/16 0605  Weight: 81.1 kg (178 lb 12.7 oz) 84.3 kg (185 lb 13.6 oz)    Examination:  General exam: deconditioned, not in pain E ENT. Mild pallor, no icterus, oral mucosa moist.   Respiratory system: Clear to auscultation. Respiratory effort normal. No wheezing, rales or rhonchi.  Cardiovascular system: S1 & S2 heard, RRR. No JVD, murmurs, rubs, gallops or clicks. No pedal  edema. Gastrointestinal system: Abdomen is distended, soft and nontender. No organomegaly or masses felt. Normal bowel sounds heard. Positive colostomy bag with small formed green stool, urostomy bag with clear urine, nephrostomy collecting bag present.  Central nervous system: Alert and oriented. No focal neurological deficits. Extremities: Symmetric 5 x 5 power. Skin: No rashes, lesions or ulcers     Data Reviewed: I have personally reviewed following labs and imaging studies  CBC:  Recent Labs Lab 09/13/16 1326 09/14/16 0414 09/16/16 0500  WBC 9.2 8.3 11.4*  NEUTROABS  --   --  8.4*  HGB 12.7 12.1 12.3  HCT 40.5 38.7 38.1  MCV 92.3 92.6 90.9  PLT 244 248 242   Basic Metabolic Panel:  Recent Labs Lab 09/13/16 1326 09/14/16 0414 09/15/16 1238 09/16/16 0500  NA 139 142 136 136  K 4.4 4.0 3.6 3.7  CL 104 106 97* 95*  CO2 24 24 30  32  GLUCOSE 90 106* 138* 125*  BUN 20 18 9 9   CREATININE 0.81 0.75 0.61 0.54  CALCIUM 8.7* 8.6* 8.6* 8.8*  MG  --  1.9  --   --    GFR: Estimated Creatinine Clearance: 62.4 mL/min (by C-G formula based on SCr of 0.54 mg/dL). Liver Function Tests:  Recent Labs Lab 09/13/16 1326  AST 44*  ALT 22  ALKPHOS 166*  BILITOT 1.1  PROT 7.4  ALBUMIN 3.0*    Recent Labs Lab 09/13/16 1326  LIPASE 16   No results for input(s): AMMONIA in the last 168 hours. Coagulation Profile:  Recent Labs Lab 09/14/16 0414  INR 1.41   Cardiac Enzymes: No results for input(s): CKTOTAL, CKMB, CKMBINDEX, TROPONINI in the last 168 hours. BNP (last 3 results) No results for input(s): PROBNP in the last 8760 hours. HbA1C: No results for input(s): HGBA1C in the last 72 hours. CBG: No results for input(s): GLUCAP in the last 168 hours. Lipid Profile: No results for input(s): CHOL, HDL, LDLCALC, TRIG, CHOLHDL, LDLDIRECT in the last 72 hours. Thyroid Function Tests:  Recent Labs  09/13/16 1949  TSH 1.586   Anemia Panel: No results for  input(s): VITAMINB12, FOLATE, FERRITIN, TIBC, IRON, RETICCTPCT in the last 72 hours. Sepsis Labs:  Recent Labs Lab 09/13/16 1657  LATICACIDVEN 0.79    Recent Results (from the past 240 hour(s))  MRSA PCR Screening     Status: None   Collection Time: 09/14/16  6:32 AM  Result Value Ref Range Status   MRSA by PCR NEGATIVE NEGATIVE Final    Comment:        The GeneXpert MRSA Assay (FDA approved for NASAL specimens only), is one component of a comprehensive MRSA colonization surveillance program. It is not intended to diagnose MRSA infection nor to guide or monitor treatment for MRSA infections.   Culture, blood (routine x 2)     Status: None (Preliminary result)   Collection Time: 09/14/16  6:53 PM  Result Value Ref Range Status  Specimen Description BLOOD RIGHT ARM  Final   Special Requests   Final    BOTTLES DRAWN AEROBIC AND ANAEROBIC Blood Culture adequate volume   Culture  Setup Time   Final    GRAM NEGATIVE RODS AEROBIC BOTTLE ONLY CRITICAL VALUE NOTED.  VALUE IS CONSISTENT WITH PREVIOUSLY REPORTED AND CALLED VALUE.    Culture GRAM NEGATIVE RODS  Final   Report Status PENDING  Incomplete  Culture, blood (routine x 2)     Status: Abnormal (Preliminary result)   Collection Time: 09/14/16  6:53 PM  Result Value Ref Range Status   Specimen Description BLOOD RIGHT ARM  Final   Special Requests IN PEDIATRIC BOTTLE Blood Culture adequate volume  Final   Culture  Setup Time   Final    GRAM NEGATIVE RODS AEROBIC BOTTLE ONLY CRITICAL RESULT CALLED TO, READ BACK BY AND VERIFIED WITH: Azzie GlatterJ. Legge Pharm.D. 10:05 09/15/16 ([email or web address])    Culture (A)  Final    ESCHERICHIA COLI SUSCEPTIBILITIES TO FOLLOW Performed at [facility], [address] [place], [place] [address]    Report Status PENDING  Incomplete  Blood Culture ID Panel (Reflexed)     Status: Abnormal   Collection Time: 09/14/16  6:53 PM  Result Value Ref Range Status   Enterococcus species NOT DETECTED NOT  DETECTED Final   Listeria monocytogenes NOT DETECTED NOT DETECTED Final   Staphylococcus species NOT DETECTED NOT DETECTED Final   Staphylococcus aureus NOT DETECTED NOT DETECTED Final   Streptococcus species NOT DETECTED NOT DETECTED Final   Streptococcus agalactiae NOT DETECTED NOT DETECTED Final   Streptococcus pneumoniae NOT DETECTED NOT DETECTED Final   Streptococcus pyogenes NOT DETECTED NOT DETECTED Final   Acinetobacter baumannii NOT DETECTED NOT DETECTED Final   Enterobacteriaceae species DETECTED (A) NOT DETECTED Final    Comment: Enterobacteriaceae represent a large family of gram-negative bacteria, not a single organism. CRITICAL RESULT CALLED TO, READ BACK BY AND VERIFIED WITH: Azzie GlatterJ. Legge Pharm.D. 10:05 09/15/16 ([email or web address])    Enterobacter cloacae complex NOT DETECTED NOT DETECTED Final   Escherichia coli DETECTED (A) NOT DETECTED Final    Comment: CRITICAL RESULT CALLED TO, READ BACK BY AND VERIFIED WITH: Mardee PostinJ. Legge Pharm.D. 10:05 09/15/16 ([email or web address])    Klebsiella oxytoca NOT DETECTED NOT DETECTED Final   Klebsiella pneumoniae NOT DETECTED NOT DETECTED Final   Proteus species NOT DETECTED NOT DETECTED Final   Serratia marcescens NOT DETECTED NOT DETECTED Final   Carbapenem resistance NOT DETECTED NOT DETECTED Final   Haemophilus influenzae NOT DETECTED NOT DETECTED Final   Neisseria meningitidis NOT DETECTED NOT DETECTED Final   Pseudomonas aeruginosa NOT DETECTED NOT DETECTED Final   Candida albicans NOT DETECTED NOT DETECTED Final   Candida glabrata NOT DETECTED NOT DETECTED Final   Candida krusei NOT DETECTED NOT DETECTED Final   Candida parapsilosis NOT DETECTED NOT DETECTED Final   Candida tropicalis NOT DETECTED NOT DETECTED Final    Comment: Performed at [facility], [address] [place], [place] [address]  Culture, Urine     Status: Abnormal (Preliminary result)   Collection Time: 09/14/16  7:23 PM  Result Value Ref Range Status   Specimen Description URINE,  RANDOM  Final   Special Requests meropenem  Final   Culture >=100,000 COLONIES/mL GRAM NEGATIVE RODS (A)  Final   Report Status PENDING  Incomplete         Radiology Studies: Dg Chest Port 1 View  Result Date: 09/14/2016 CLINICAL DATA:  Fever post  nephrostomy tube. EXAM: PORTABLE CHEST 1 VIEW COMPARISON:  09/13/2016 FINDINGS: Bibasilar atelectasis. Heart is borderline in size. No effusions. NG tube is seen entering the stomach. IMPRESSION: Bibasilar atelectasis. Electronically Signed   By: Charlett Nose M.D.   On: 09/14/2016 19:30   Dg Abd Portable 1v  Result Date: 09/15/2016 CLINICAL DATA:  Small bowel obstruction EXAM: PORTABLE ABDOMEN - 1 VIEW COMPARISON:  09/14/2016 FINDINGS: NG tube remains in the stomach. Oral contrast material noted throughout the colon. No dilated small bowel loops. Herniated colon in the right lower quadrant again noted as seen on prior CT. No free air organomegaly. Prior cholecystectomy. IMPRESSION: Contrast material noted throughout the colon, similar prior study. No current evidence of bowel obstruction. Electronically Signed   By: Charlett Nose M.D.   On: 09/15/2016 07:12   Dg Abd Portable 1v  Result Date: 09/14/2016 CLINICAL DATA:  71 year old female 24 hours status post enteric contrast administration via NG tube for small bowel obstruction. Status post surgical nephrostomy tube placement today. EXAM: PORTABLE ABDOMEN - 1 VIEW COMPARISON:  0237 hours today, and earlier. FINDINGS: Portable AP view at 1900 hours. Stable enteric tube. Left side nephrostomy tube now in place. Large left lateral abdominal hernia as seen on the CT Abdomen and Pelvis yesterday. Oral contrast is present in the large bowel from the cecum to the descending colon level. Stool ball re - demonstrated in the rectum. No dilated small bowel loops are evident. Stable visualized osseous structures. IMPRESSION: 1. Oral contrast has definitely reached the colon. Enteric contrast throughout large bowel  from the cecum to the descending colon. 2. No dilated small bowel loops are evident. 3. Stable enteric tube.  Left nephrostomy tube now in place. Electronically Signed   By: Odessa Fleming M.D.   On: 09/14/2016 19:36   Ir Nephrostomy Placement Left  Result Date: 09/14/2016 INDICATION: 71 year old with an atrophic right kidney and obstructing left UPJ stone. Patient also has a history of cystectomy with an ileal conduit. Patient needs decompression of the left renal collecting system. EXAM: PLACEMENT OF LEFT PERCUTANEOUS NEPHROSTOMY TUBE WITH ULTRASOUND AND FLUOROSCOPIC GUIDANCE COMPARISON:  None. MEDICATIONS: Ciprofloxacin 400 mg IV; The antibiotic was administered in an appropriate time frame prior to skin puncture. ANESTHESIA/SEDATION: Fentanyl 1.0 mcg IV; Versed 25 mg IV Moderate Sedation Time:  13 minutes The patient was continuously monitored during the procedure by the interventional radiology nurse under my direct supervision. CONTRAST:  7 mL Isovue 300 - administered into the collecting system(s) FLUOROSCOPY TIME:  Fluoroscopy Time: 42 seconds, 11 mGy). COMPLICATIONS: None immediate. PROCEDURE: Informed written consent was obtained from the patient after a thorough discussion of the procedural risks, benefits and alternatives. All questions were addressed. Maximal Sterile Barrier Technique was utilized including caps, mask, sterile gowns, sterile gloves, sterile drape, hand hygiene and skin antiseptic. A timeout was performed prior to the initiation of the procedure. Patient was placed prone. Ultrasound was used to identify the left kidney. Left flank was prepped and draped in a sterile fashion. Skin was anesthetized with 1% lidocaine. Using ultrasound guidance, 21 gauge needle was directed into a mid/ lower pole calyx. Wire was easily advanced into the renal collecting system. An Accustick dilator set was placed. A J wire was placed and a 10 [place] multipurpose drain was reconstituted in the renal pelvis. Small  contrast injection confirmed placement in the renal collecting system. Catheter was sutured to skin and attached to gravity bag. Fluoroscopic and ultrasound images were taken and saved for documentation. FINDINGS:  Moderate left hydronephrosis. Nephrostomy tube coiled in the renal pelvis. IMPRESSION: Successful placement of a left percutaneous nephrostomy tube with ultrasound and fluoroscopic guidance. Electronically Signed   By: Richarda Overlie M.D.   On: 09/14/2016 11:10        Scheduled Meds: . artificial tears   Both Eyes QHS  . chlorhexidine  15 mL Mouth/Throat BID  . heparin  5,000 Units Subcutaneous Q8H  . mometasone-formoterol  2 puff Inhalation BID  . polyvinyl alcohol  1 drop Both Eyes QID  . pregabalin  150 mg Oral BID  . tiotropium  18 mcg Inhalation Daily  . venlafaxine XR  150 mg Oral Q breakfast  . venlafaxine XR  75 mg Oral Q breakfast   Continuous Infusions: . cefTRIAXone (ROCEPHIN)  IV Stopped (09/15/16 1324)  . dextrose 5 % with KCl 20 mEq / L 20 mEq (09/16/16 1018)  . diphenhydrAMINE (BENADRYL) IVPB(SICKLE CELL ONLY)       LOS: 3 days       Mauricio Annett Gula, MD [facility] Pager [phone]  If 7PM-7AM, please contact night-coverage [email or web address] Password [email or web address] 09/16/2016, 10:45 AM

## 2016-09-16 NOTE — Progress Notes (Signed)
Subjective/Chief Complaint:   1 - Left ureteral Stone in Functionally Solitary Kidney - s/p left neph tube 09/14/16 fir 8mm left proximal ureteral stone with moderate hydro by ER CT 09/2016 on eval malaise and nausea. No additional left sided stones.   2 -  Spina Bifida s/p Cystectomy - s/p cystectomy with conduit diversion as teenager for spina bifida. Baseline Cr 1.0  3 -  Atrophic Right Kidney - nearly completely atrophic right kidney by ER CT 09/2016.   4 - Gram Negative Bacteremia - GNR in BCX thiis admission, e. Coli and enterobacter. Left obstructing stone as per above now s/p decompression. Now on empiric rocephin.    Today "Deborah Young" is seen is stable. Now on rocpehin for gram negative bacteremia ffrom suspect GU v. GI source. neph tube no w inplace and functioning well.    Objective: Vital signs in last 24 hours: Temp:  [99.3 F (37.4 C)-100.2 F (37.9 C)] 100.2 F (37.9 C) (07/05 4098) Pulse Rate:  [77-81] 81 (07/05 0632) Resp:  [16] 16 (07/05 0632) BP: (132-151)/(57-64) 132/57 (07/05 0632) SpO2:  [91 %-96 %] 91 % (07/05 0813) Last BM Date: 09/07/16  Intake/Output from previous day: 07/04 0701 - 07/05 0700 In: 1451.3 [I.V.:941.3] Out: 2245 [Urine:2195; Emesis/NG output:50] Intake/Output this shift: No intake/output data recorded.  General appearance: alert and stigmata of spina bifida, at recent baseline.  Eyes: negative Nose: Nares normal. Septum midline. Mucosa normal. No drainage or sinus tenderness. Throat: lips, mucosa, and tongue normal; teeth and gums normal Neck: supple, symmetrical, trachea midline Back: symmetric, no curvature. ROM normal. No CVA tenderness. Resp: non-labored on [place] O2 at present.  Cardio: Nl rate.  GI: large truncal obesity.  Extremities: extremities normal, atraumatic, no cyanosis or edema Pulses: 2+ and symmetric Skin: Skin color, texture, turgor normal. No rashes or lesions Lymph nodes: Cervical, supraclavicular, and axillary  nodes normal. Neurologic: Grossly normal  Neph tube in place with clear yellow urine, non-foul.   Lab Results:   Recent Labs  09/14/16 0414 09/16/16 0500  WBC 8.3 11.4*  HGB 12.1 12.3  HCT 38.7 38.1  PLT 248 242   BMET  Recent Labs  09/15/16 1238 09/16/16 0500  NA 136 136  K 3.6 3.7  CL 97* 95*  CO2 30 32  GLUCOSE 138* 125*  BUN 9 9  CREATININE 0.61 0.54  CALCIUM 8.6* 8.8*   PT/INR  Recent Labs  09/14/16 0414  LABPROT 17.3*  INR 1.41   ABG No results for input(s): PHART, HCO3 in the last 72 hours.  Invalid input(s): PCO2, PO2  Studies/Results: Dg Chest Port 1 View  Result Date: 09/14/2016 CLINICAL DATA:  Fever post nephrostomy tube. EXAM: PORTABLE CHEST 1 VIEW COMPARISON:  09/13/2016 FINDINGS: Bibasilar atelectasis. Heart is borderline in size. No effusions. NG tube is seen entering the stomach. IMPRESSION: Bibasilar atelectasis. Electronically Signed   By: Charlett Nose M.D.   On: 09/14/2016 19:30   Dg Abd Portable 1v  Result Date: 09/15/2016 CLINICAL DATA:  Small bowel obstruction EXAM: PORTABLE ABDOMEN - 1 VIEW COMPARISON:  09/14/2016 FINDINGS: NG tube remains in the stomach. Oral contrast material noted throughout the colon. No dilated small bowel loops. Herniated colon in the right lower quadrant again noted as seen on prior CT. No free air organomegaly. Prior cholecystectomy. IMPRESSION: Contrast material noted throughout the colon, similar prior study. No current evidence of bowel obstruction. Electronically Signed   By: Charlett Nose M.D.   On: 09/15/2016 07:12   Dg Abd  Portable 1v  Result Date: 09/14/2016 CLINICAL DATA:  71 year old female 24 hours status post enteric contrast administration via NG tube for small bowel obstruction. Status post surgical nephrostomy tube placement today. EXAM: PORTABLE ABDOMEN - 1 VIEW COMPARISON:  0237 hours today, and earlier. FINDINGS: Portable AP view at 1900 hours. Stable enteric tube. Left side nephrostomy tube now in  place. Large left lateral abdominal hernia as seen on the CT Abdomen and Pelvis yesterday. Oral contrast is present in the large bowel from the cecum to the descending colon level. Stool ball re - demonstrated in the rectum. No dilated small bowel loops are evident. Stable visualized osseous structures. IMPRESSION: 1. Oral contrast has definitely reached the colon. Enteric contrast throughout large bowel from the cecum to the descending colon. 2. No dilated small bowel loops are evident. 3. Stable enteric tube.  Left nephrostomy tube now in place. Electronically Signed   By: Odessa Fleming M.D.   On: 09/14/2016 19:36   Ir Nephrostomy Placement Left  Result Date: 09/14/2016 INDICATION: 71 year old with an atrophic right kidney and obstructing left UPJ stone. Patient also has a history of cystectomy with an ileal conduit. Patient needs decompression of the left renal collecting system. EXAM: PLACEMENT OF LEFT PERCUTANEOUS NEPHROSTOMY TUBE WITH ULTRASOUND AND FLUOROSCOPIC GUIDANCE COMPARISON:  None. MEDICATIONS: Ciprofloxacin 400 mg IV; The antibiotic was administered in an appropriate time frame prior to skin puncture. ANESTHESIA/SEDATION: Fentanyl 1.0 mcg IV; Versed 25 mg IV Moderate Sedation Time:  13 minutes The patient was continuously monitored during the procedure by the interventional radiology nurse under my direct supervision. CONTRAST:  7 mL Isovue 300 - administered into the collecting system(s) FLUOROSCOPY TIME:  Fluoroscopy Time: 42 seconds, 11 mGy). COMPLICATIONS: None immediate. PROCEDURE: Informed written consent was obtained from the patient after a thorough discussion of the procedural risks, benefits and alternatives. All questions were addressed. Maximal Sterile Barrier Technique was utilized including caps, mask, sterile gowns, sterile gloves, sterile drape, hand hygiene and skin antiseptic. A timeout was performed prior to the initiation of the procedure. Patient was placed prone. Ultrasound was used  to identify the left kidney. Left flank was prepped and draped in a sterile fashion. Skin was anesthetized with 1% lidocaine. Using ultrasound guidance, 21 gauge needle was directed into a mid/ lower pole calyx. Wire was easily advanced into the renal collecting system. An Accustick dilator set was placed. A J wire was placed and a 10 [place] multipurpose drain was reconstituted in the renal pelvis. Small contrast injection confirmed placement in the renal collecting system. Catheter was sutured to skin and attached to gravity bag. Fluoroscopic and ultrasound images were taken and saved for documentation. FINDINGS: Moderate left hydronephrosis. Nephrostomy tube coiled in the renal pelvis. IMPRESSION: Successful placement of a left percutaneous nephrostomy tube with ultrasound and fluoroscopic guidance. Electronically Signed   By: Richarda Overlie M.D.   On: 09/14/2016 11:10    Anti-infectives: Anti-infectives    Start     Dose/Rate Route Frequency Ordered Stop   09/15/16 1100  cefTRIAXone (ROCEPHIN) 2 g in dextrose 5 % 50 mL IVPB     2 g 100 mL/hr over 30 Minutes Intravenous Every 24 hours 09/15/16 1050     09/14/16 2000  meropenem (MERREM) 1 g in sodium chloride 0.9 % 100 mL IVPB  Status:  Discontinued     1 g 200 mL/hr over 30 Minutes Intravenous Every 8 hours 09/14/16 1846 09/15/16 1050   09/14/16 0945  ciprofloxacin (CIPRO) IVPB 400 mg  400 mg 200 mL/hr over 60 Minutes Intravenous To Radiology 09/14/16 0929 09/14/16 1124      Assessment/Plan:  1 - Left ureteral Stone in Functionally Solitary Kidney - althogh Cr normal, she has impending obstrucion of her functionally solitary kidney. Rec left neph tube tomorrow. She will need antegrade ureteroscopy / PCNL in elective settting for this after over acute SBO.   I have ordered left neph tube for tomorrow. Pt is NPO, no blood thinners. I have spoken with IT techs abotu this.   2 -  Spina Bifida s/p Cystectomy - GFR preserved.   3 -  Atrophic  Right Kidney - no indications for removal, observe.   [facility], Keefe Zawistowski 09/16/2016

## 2016-09-16 NOTE — Progress Notes (Signed)
Referring Physician(s): Manny,T  Supervising Physician: Malachy Moan  Patient Status:  [facility] [place] - In-pt  Chief Complaint:  Left renal stone/hydronephrosis  Subjective: Pt resting comfortably; no acute changes; has some mild left flank soreness; denies N/V   Allergies: Codeine; Erythromycin; and Penicillins  Medications: Prior to Admission medications   Medication Sig Start Date End Date Taking? Authorizing Provider  aspirin EC 81 MG tablet Take 81 mg by mouth daily.   Yes [provider]  budesonide-formoterol (SYMBICORT) 80-4.5 MCG/ACT inhaler Inhale 2 puffs into the lungs 2 (two) times daily.   Yes [provider]  carboxymethylcellulose (REFRESH PLUS) 0.5 % SOLN 1 drop 4 (four) times daily.   Yes [provider]  chlorhexidine (PERIDEX) 0.12 % solution Use as directed 15 mLs in the mouth or throat 2 (two) times daily.   Yes [provider]  HYDROcodone-acetaminophen (NORCO) 7.5-325 MG tablet Take 1 tablet by mouth 3 (three) times daily.   Yes [provider]  HYDROcodone-acetaminophen (NORCO) 7.5-325 MG tablet Take 1 tablet by mouth 2 (two) times daily as needed for moderate pain.   Yes [provider]  hypromellose (SYSTANE OVERNIGHT THERAPY) 0.3 % GEL ophthalmic ointment Place 1 application into both eyes at bedtime.   Yes [provider]  lamoTRIgine (LAMICTAL) 100 MG tablet Take 100 mg by mouth daily.   Yes [provider]  pregabalin (LYRICA) 100 MG capsule Take 100 mg by mouth 2 (two) times daily.   Yes [provider]  pregabalin (LYRICA) 150 MG capsule Take 150 mg by mouth 2 (two) times daily.   Yes [provider]  primidone (MYSOLINE) 50 MG tablet Take 50 mg by mouth 2 (two) times daily.   Yes [provider]  tiotropium (SPIRIVA) 18 MCG inhalation capsule Place 18 mcg into inhaler and inhale daily.   Yes [provider]  venlafaxine XR (EFFEXOR-XR) 150 MG 24  hr capsule Take 150 mg by mouth daily with breakfast.   Yes [provider]  venlafaxine XR (EFFEXOR-XR) 75 MG 24 hr capsule Take 75 mg by mouth daily with breakfast.   Yes [provider]  Vitamin D, Ergocalciferol, (DRISDOL) 50000 units CAPS capsule Take 50,000 Units by mouth every 7 (seven) days.   Yes [provider]     Vital Signs: BP (!) 132/57 (BP Location: Right Arm)   Pulse 81   Temp 100.2 F (37.9 C) (Oral)   Resp 16   Ht 4\' 11"  (1.499 m)   Wt 185 lb 13.6 oz (84.3 kg)   SpO2 91%   BMI 37.54 kg/m   Physical Exam left PCN intact, dressing dry, output 1.1 liters light yellow urine, site mildly tender  Imaging: Ct Abdomen Pelvis W Contrast  Result Date: 09/13/2016 CLINICAL DATA:  Abdominal pain in various locations x5 days with nausea and constipation. Last bowel movement 6 days ago. No emesis. Soft nontender focus lateral to urostomy bag in the right lower quadrant. Small nontender mass right periumbilical region as well. EXAM: CT ABDOMEN AND PELVIS WITH CONTRAST TECHNIQUE: Multidetector CT imaging of the abdomen and pelvis was performed using the standard protocol following bolus administration of intravenous contrast. CONTRAST:  ISOVUE-300 IOPAMIDOL (ISOVUE-300) INJECTION 61% COMPARISON:  02/26/2009 FINDINGS: Lower chest: Bibasilar subsegmental atelectasis and/or scarring. Included heart appears normal in size without pericardial effusion. Hepatobiliary: Hepatic steatosis. Stable hypodensity along the falciform ligament may reflect fatty infiltration. Status post cholecystectomy. No biliary dilatation. Pancreas: Unremarkable. No pancreatic ductal dilatation or  surrounding inflammatory changes. Spleen: Normal in size without focal abnormality. Adrenals/Urinary Tract: There is an 8 x 8 x 9 mm left UPJ stone causing moderate left-sided hydronephrosis and perinephric fat stranding. Atrophic right kidney with 8 mm nonobstructing calculus. Diverting right  lower quadrant urostomy with parastomal hernia containing a portion adjacent ascending colon as well as omental fat. Stomach/Bowel: Physiologically distended stomach. Normal small bowel rotation. Left lateral hernia is again noted containing fluid-filled distended jejunal loops measuring up to 4.5 cm in caliber as well as portions of nondistended descending colon containing stool. There appears to be a right lower quadrant transition point, series 2, image 69 contributing to a partial high-grade or early small bowel obstruction. Desiccated stool noted in the rectosigmoid. Vascular/Lymphatic: Aortic atherosclerosis without aneurysm or dissection. No lymphadenopathy by CT size criteria. Reproductive: Uterus and bilateral adnexa are unremarkable. Other: There is a 5 x 5 x 2.2 cm fat containing right periumbilical ventral hernia. Musculoskeletal: Dextroconvex curvature of the lower thoracic spine. Lower lumbar facet arthropathy. No acute osseous appearing abnormality. 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 IMPRESSION: 1. 8 x 8 x 9 mm left UPJ stone causing moderate left-sided hydronephrosis. Atrophic native right kidney with nonobstructing renal calculus. 2. Early or high grade partial SBO secondary to right lower quadrant transition point possibly from an adhesion or short segmental stricture involving distal small intestine, series 2, image 69. 3. Right lower quadrant urostomy with new parastomal fat and large bowel containing hernia without evidence of incarceration. This may account for the reported soft tissue distention lateral to the urostomy. 4. Small fat containing right periumbilical ventral hernia. 5. Known left lateral small and large bowel containing hernia. 6. 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 7. Hepatic steatosis. 8. Cholecystectomy. Electronically Signed   By: Tollie Eth M.D.   On:  09/13/2016 15:17   Dg Chest Port 1 View  Result Date: 09/14/2016 CLINICAL DATA:  Fever post nephrostomy tube. EXAM: PORTABLE CHEST 1 VIEW COMPARISON:  09/13/2016 FINDINGS: Bibasilar atelectasis. Heart is borderline in size. No effusions. NG tube is seen entering the stomach. IMPRESSION: Bibasilar atelectasis. Electronically Signed   By: Charlett Nose M.D.   On: 09/14/2016 19:30   Dg Chest Port 1 View  Result Date: 09/13/2016 CLINICAL DATA:  Shortness of breath. EXAM: PORTABLE CHEST 1 VIEW COMPARISON:  01/26/2016 and prior exam FINDINGS: Upper limits normal heart size again noted. Patchy right lower lung opacities are noted and may represent airspace disease/ pneumonia. Mild left basilar scarring/ atelectasis is unchanged. There is no evidence of pleural effusion or pneumothorax. A severe thoracic scoliosis is again noted. IMPRESSION: Patchy right lower lung opacities -question pneumonia. No other acute abnormality. Electronically Signed   By: Harmon Pier M.D.   On: 09/13/2016 16:50   Dg Abd Portable 1v  Result Date: 09/15/2016 CLINICAL DATA:  Small bowel obstruction EXAM: PORTABLE ABDOMEN - 1 VIEW COMPARISON:  09/14/2016 FINDINGS: NG tube remains in the stomach. Oral contrast material noted throughout the colon. No dilated small bowel loops. Herniated colon in the right lower quadrant again noted as seen on prior CT. No free air organomegaly. Prior cholecystectomy. IMPRESSION: Contrast material noted throughout the colon, similar prior study. No current evidence of bowel obstruction. Electronically Signed   By: Charlett Nose M.D.   On: 09/15/2016 07:12   Dg Abd Portable 1v  Result Date: 09/14/2016 CLINICAL DATA:  71 year old female 24 hours status  post enteric contrast administration via NG tube for small bowel obstruction. Status post surgical nephrostomy tube placement today. EXAM: PORTABLE ABDOMEN - 1 VIEW COMPARISON:  0237 hours today, and earlier. FINDINGS: Portable AP view at 1900 hours. Stable  enteric tube. Left side nephrostomy tube now in place. Large left lateral abdominal hernia as seen on the CT Abdomen and Pelvis yesterday. Oral contrast is present in the large bowel from the cecum to the descending colon level. Stool ball re - demonstrated in the rectum. No dilated small bowel loops are evident. Stable visualized osseous structures. IMPRESSION: 1. Oral contrast has definitely reached the colon. Enteric contrast throughout large bowel from the cecum to the descending colon. 2. No dilated small bowel loops are evident. 3. Stable enteric tube.  Left nephrostomy tube now in place. Electronically Signed   By: Odessa FlemingH  Hall M.D.   On: 09/14/2016 19:36   Dg Abd Portable 1v-small Bowel Obstruction Protocol-initial, 8 Hr Delay  Result Date: 09/14/2016 CLINICAL DATA:  Small bowel obstruction.  8 hour delayed film. EXAM: PORTABLE ABDOMEN - 1 VIEW COMPARISON:  Radiographs and CT yesterday FINDINGS: Enteric tube in place. Administered enteric contrast within bowel loops in both the right and left abdomen. There may be contrast in the colon in the right abdomen versus dilated small bowel. Air-filled dilated small bowel in the central abdomen again seen. No free air. IMPRESSION: Enteric contrast possibly but not definitively seen within the right colon. Recommend 24 exam for confirmation. Dilated small bowel in the central abdomen again seen. Electronically Signed   By: Rubye OaksMelanie  Ehinger M.D.   On: 09/14/2016 03:11   Dg Abd Portable 1v-small Bowel Protocol-position Verification  Result Date: 09/13/2016 CLINICAL DATA:  NG tube placement EXAM: PORTABLE ABDOMEN - 1 VIEW COMPARISON:  None. FINDINGS: 1810 hours. NG tube tip is is positioned in the mid stomach. Contrast material in the left kidney and intrarenal collecting system is compatible with the CT scan earlier today. Mild fullness of the left intrarenal collecting system evident. Visualized bowel loops are diffusely gas distended. IMPRESSION: NG tube tip is in  the mid stomach. Electronically Signed   By: Kennith CenterEric  Mansell M.D.   On: 09/13/2016 18:28   Ir Nephrostomy Placement Left  Result Date: 09/14/2016 INDICATION: 71 year old with an atrophic right kidney and obstructing left UPJ stone. Patient also has a history of cystectomy with an ileal conduit. Patient needs decompression of the left renal collecting system. EXAM: PLACEMENT OF LEFT PERCUTANEOUS NEPHROSTOMY TUBE WITH ULTRASOUND AND FLUOROSCOPIC GUIDANCE COMPARISON:  None. MEDICATIONS: Ciprofloxacin 400 mg IV; The antibiotic was administered in an appropriate time frame prior to skin puncture. ANESTHESIA/SEDATION: Fentanyl 1.0 mcg IV; Versed 25 mg IV Moderate Sedation Time:  13 minutes The patient was continuously monitored during the procedure by the interventional radiology nurse under my direct supervision. CONTRAST:  7 mL Isovue 300 - administered into the collecting system(s) FLUOROSCOPY TIME:  Fluoroscopy Time: 42 seconds, 11 mGy). COMPLICATIONS: None immediate. PROCEDURE: Informed written consent was obtained from the patient after a thorough discussion of the procedural risks, benefits and alternatives. All questions were addressed. Maximal Sterile Barrier Technique was utilized including caps, mask, sterile gowns, sterile gloves, sterile drape, hand hygiene and skin antiseptic. A timeout was performed prior to the initiation of the procedure. Patient was placed prone. Ultrasound was used to identify the left kidney. Left flank was prepped and draped in a sterile fashion. Skin was anesthetized with 1% lidocaine. Using ultrasound guidance, 21 gauge needle was directed into a  mid/ lower pole calyx. Wire was easily advanced into the renal collecting system. An Accustick dilator set was placed. A J wire was placed and a 10 [place] multipurpose drain was reconstituted in the renal pelvis. Small contrast injection confirmed placement in the renal collecting system. Catheter was sutured to skin and attached to  gravity bag. Fluoroscopic and ultrasound images were taken and saved for documentation. FINDINGS: Moderate left hydronephrosis. Nephrostomy tube coiled in the renal pelvis. IMPRESSION: Successful placement of a left percutaneous nephrostomy tube with ultrasound and fluoroscopic guidance. Electronically Signed   By: Richarda Overlie M.D.   On: 09/14/2016 11:10    Labs:  CBC:  Recent Labs  09/13/16 1326 09/14/16 0414 09/16/16 0500  WBC 9.2 8.3 11.4*  HGB 12.7 12.1 12.3  HCT 40.5 38.7 38.1  PLT 244 248 242    COAGS:  Recent Labs  09/14/16 0414  INR 1.41    BMP:  Recent Labs  09/13/16 1326 09/14/16 0414 09/15/16 1238 09/16/16 0500  NA 139 142 136 136  K 4.4 4.0 3.6 3.7  CL 104 106 97* 95*  CO2 24 24 30  32  GLUCOSE 90 106* 138* 125*  BUN 20 18 9 9   CALCIUM 8.7* 8.6* 8.6* 8.8*  CREATININE 0.81 0.75 0.61 0.54  GFRNONAA >60 >60 >60 >60  GFRAA >60 >60 >60 >60    LIVER FUNCTION TESTS:  Recent Labs  09/13/16 1326  BILITOT 1.1  AST 44*  ALT 22  ALKPHOS 166*  PROT 7.4  ALBUMIN 3.0*    Assessment and Plan: Left UPJ stone/hydronephrosis (solitary functioning kidney); s/p left PCN 7/3; blood cx- e coli/enterobacter, urine cx- e ecoli; temp 100.2; WBC 11.4, creat nl; cont PCN until otherwise directed by urology; antbx per pharm   Electronically Signed: D. Jeananne Rama, PA-C 09/16/2016, 1:27 PM   I spent a total of 15 minutes at the the patient's bedside AND on the patient's hospital floor or unit, greater than 50% of which was counseling/coordinating care for left nephrostomy    Patient ID: Deborah Young, female   DOB: 08-23-1945, 71 y.o.   MRN: [id number]

## 2016-09-17 LAB — BASIC METABOLIC PANEL
Anion gap: 12 (ref 5–15)
BUN: 8 mg/dL (ref 6–20)
CALCIUM: 8.8 mg/dL — AB (ref 8.9–10.3)
CO2: 29 mmol/L (ref 22–32)
Chloride: 98 mmol/L — ABNORMAL LOW (ref 101–111)
Creatinine, Ser: 0.53 mg/dL (ref 0.44–1.00)
GFR calc Af Amer: >60 mL/min (ref >60)
GFR calc non Af Amer: >60 mL/min (ref >60)
GLUCOSE: 118 mg/dL — AB (ref 65–99)
POTASSIUM: 4.2 mmol/L (ref 3.5–5.1)
Sodium: 139 mmol/L (ref 135–145)

## 2016-09-17 LAB — CULTURE, BLOOD (ROUTINE X 2)
SPECIAL REQUESTS: ADEQUATE
Special Requests: ADEQUATE

## 2016-09-17 LAB — CBC WITH DIFFERENTIAL/PLATELET
Basophils Absolute: 0 10*3/uL (ref 0.0–0.1)
Basophils Relative: 0 %
EOS PCT: 3 %
Eosinophils Absolute: 0.3 10*3/uL (ref 0.0–0.7)
HEMATOCRIT: 42.5 % (ref 36.0–46.0)
Hemoglobin: 13.6 g/dL (ref 12.0–15.0)
LYMPHS ABS: 1.9 10*3/uL (ref 0.7–4.0)
LYMPHS PCT: 20 %
MCH: 29.2 pg (ref 26.0–34.0)
MCHC: 32 g/dL (ref 30.0–36.0)
MCV: 91.2 fL (ref 78.0–100.0)
MONO ABS: 0.5 10*3/uL (ref 0.1–1.0)
Monocytes Relative: 5 %
Neutro Abs: 6.8 10*3/uL (ref 1.7–7.7)
Neutrophils Relative %: 72 %
PLATELETS: 238 10*3/uL (ref 150–400)
RBC: 4.66 MIL/uL (ref 3.87–5.11)
RDW: 15.8 % — AB (ref 11.5–15.5)
WBC: 9.6 10*3/uL (ref 4.0–10.5)

## 2016-09-17 LAB — URINE CULTURE: Culture: >=100000 — AB

## 2016-09-17 MED ORDER — DEXTROSE-NACL 5-0.9 % IV SOLN
INTRAVENOUS | Status: DC
  Administered 2016-09-17 – 2016-09-18 (×3): via INTRAVENOUS

## 2016-09-17 NOTE — Progress Notes (Signed)
PROGRESS NOTE    Deborah Young  VHQ:[id number] DOB: 12-12-1945 DOA: 09/13/2016 PCP: Nadara Eaton, MD    Brief Narrative:  71 year old female who presented with a chief complaint of constipation. She does have history of spina bifida, status post cystectomy and conduit diversion, left BKA, right foot multiple toe amputations, diverging colostomy and urostomy, COPD, peripheral neuropathy, history of small bowel structures in the past requiring surgical intervention. She presented that gradually progressive abdominal pain for last 5-6 days prior to hospitalization, associated with nausea and lack of stool at her colostomy bag. On initial physical examination blood pressure 129/66, heart rate 77, temperature 98.5, respiratory rate 18, oxygen saturation 94%, her oral mucosa was moist, her neck was supple,her lungs were clear to auscultation bilaterally, heart S1-S2 present and rhythmic, her abdomen had hypoactive bowel sounds, positive distention,her colostomy bag was empty with no rebound or peritoneal sounds, no lower extremity edema. Left BKA with right foot multiple toe amputations. Imaging suggestive small bowel obstruction. Patient was admitted to the medical floor with a working diagnosis of small bowel obstruction, incidental UPJ stone with hydronephrosis. Patient had a NG tube placed to low intermittent suction, urology placed a left nephrostomy tube   Assessment & Plan:   Principal Problem:   SBO (small bowel obstruction) (HCC) Active Problems:   Essential hypertension, benign   CAD (coronary artery disease)   GERD   1. Partial small bowel obstruction. No further nausea or vomiting. Ostomy bag with very small amount of liquid stool, reports passing gas. Case discusses with surgery, will continue NPO for now until more significant and relevant output to ostomy bag. Continue IV fluids for hydration, will hold on further K supplementation.   2. Left UPJ stone with hydronephrosis  complicated with urine tract infection and gram negative sepsis, E coli. (present on admission). Appropriate urine output, documented 2,740, preserved renal function with a serum cr at  0,53, with K at 4,2 and serum bicarbonate at 29. No significant back pain, urine clear. Continue antibiotic therapy with ceftriaxone, urine and blood cultures positive for E Coli sensitive to cephalosporins but resistant to fluoroquinolones. Follow on repeat blood cultures.    3. COPD, stable with no signs of acute exacerbation. On standard bronchodilator therapy with dulera and tiotropiunm.   4. Spina bifida sp cystectomy/ morbid obesity. DVT prophylaxis. Non ambulatory.   5. Depression. On venlafaxine, no agitation or confusion.    DVT prophylaxis:enoxaparin  Code Status:DNR Family Communication:No family at the bedside  Disposition Plan:   Consultants:  Urology   Surgery   Procedures:  Left nephrostomy tube    Antimicrobials:   Subjective: Patient feeling better, improved abdominal pain, no nausea or vomiting, no significant output from ostomy bag. No dyspnea or chest pain.   Objective: Vitals:   09/16/16 2150 09/16/16 2207 09/17/16 0504 09/17/16 0655  BP: 128/67  (!) 121/58   Pulse: 76 79 80   Resp: 16 16 16    Temp: 98.9 F (37.2 C)  100 F (37.8 C)   TempSrc: Oral  Oral   SpO2: 99% 98% 94%   Weight:    82.7 kg (182 lb 5.1 oz)  Height:        Intake/Output Summary (Last 24 hours) at 09/17/16 1334 Last data filed at 09/17/16 1013  Gross per 24 hour  Intake           941.25 ml  Output             2445 ml  Net         -1503.75 ml   Filed Weights   09/14/16 0500 09/15/16 0605 09/17/16 0655  Weight: 81.1 kg (178 lb 12.7 oz) 84.3 kg (185 lb 13.6 oz) 82.7 kg (182 lb 5.1 oz)    Examination:  General exam: deconditioned E ENT: mild pallor, no icterus, oral mucosa moist.  Respiratory system: Clear to auscultation. Respiratory effort normal. No wheezing, rales or  rhonchi, poor inspiratory effort.  Cardiovascular system: S1 & S2 heard, RRR. No JVD, murmurs, rubs, gallops or clicks. No pedal edema. Gastrointestinal system: Abdomen is mild distended, soft and nontender. No organomegaly or masses felt. Normal bowel sounds heard. Central nervous system: Alert and oriented. No focal neurological deficits. Extremities: Symmetric 5 x 5 power. Skin: No rashes, lesions or ulcers     Data Reviewed: I have personally reviewed following labs and imaging studies  CBC:  Recent Labs Lab 09/13/16 1326 09/14/16 0414 09/16/16 0500 09/17/16 0501  WBC 9.2 8.3 11.4* 9.6  NEUTROABS  --   --  8.4* 6.8  HGB 12.7 12.1 12.3 13.6  HCT 40.5 38.7 38.1 42.5  MCV 92.3 92.6 90.9 91.2  PLT 244 248 242 238   Basic Metabolic Panel:  Recent Labs Lab 09/13/16 1326 09/14/16 0414 09/15/16 1238 09/16/16 0500 09/17/16 0501  NA 139 142 136 136 139  K 4.4 4.0 3.6 3.7 4.2  CL 104 106 97* 95* 98*  CO2 24 24 30  32 29  GLUCOSE 90 106* 138* 125* 118*  BUN 20 18 9 9 8   CREATININE 0.81 0.75 0.61 0.54 0.53  CALCIUM 8.7* 8.6* 8.6* 8.8* 8.8*  MG  --  1.9  --   --   --    GFR: Estimated Creatinine Clearance: 61.8 mL/min (by C-G formula based on SCr of 0.53 mg/dL). Liver Function Tests:  Recent Labs Lab 09/13/16 1326  AST 44*  ALT 22  ALKPHOS 166*  BILITOT 1.1  PROT 7.4  ALBUMIN 3.0*    Recent Labs Lab 09/13/16 1326  LIPASE 16   No results for input(s): AMMONIA in the last 168 hours. Coagulation Profile:  Recent Labs Lab 09/14/16 0414  INR 1.41   Cardiac Enzymes: No results for input(s): CKTOTAL, CKMB, CKMBINDEX, TROPONINI in the last 168 hours. BNP (last 3 results) No results for input(s): PROBNP in the last 8760 hours. HbA1C: No results for input(s): HGBA1C in the last 72 hours. CBG: No results for input(s): GLUCAP in the last 168 hours. Lipid Profile: No results for input(s): CHOL, HDL, LDLCALC, TRIG, CHOLHDL, LDLDIRECT in the last 72  hours. Thyroid Function Tests: No results for input(s): TSH, T4TOTAL, FREET4, T3FREE, THYROIDAB in the last 72 hours. Anemia Panel: No results for input(s): VITAMINB12, FOLATE, FERRITIN, TIBC, IRON, RETICCTPCT in the last 72 hours. Sepsis Labs:  Recent Labs Lab 09/13/16 1657  LATICACIDVEN 0.79    Recent Results (from the past 240 hour(s))  MRSA PCR Screening     Status: None   Collection Time: 09/14/16  6:32 AM  Result Value Ref Range Status   MRSA by PCR NEGATIVE NEGATIVE Final    Comment:        The GeneXpert MRSA Assay (FDA approved for NASAL specimens only), is one component of a comprehensive MRSA colonization surveillance program. It is not intended to diagnose MRSA infection nor to guide or monitor treatment for MRSA infections.   Culture, blood (routine x 2)     Status: Abnormal   Collection Time: 09/14/16  6:53 PM  Result Value Ref Range Status   Specimen Description BLOOD RIGHT ARM  Final   Special Requests   Final    BOTTLES DRAWN AEROBIC AND ANAEROBIC Blood Culture adequate volume   Culture  Setup Time   Final    GRAM NEGATIVE RODS AEROBIC BOTTLE ONLY CRITICAL VALUE NOTED.  VALUE IS CONSISTENT WITH PREVIOUSLY REPORTED AND CALLED VALUE.    Culture (A)  Final    ESCHERICHIA COLI SUSCEPTIBILITIES PERFORMED ON PREVIOUS CULTURE WITHIN THE LAST 5 DAYS. Performed at [facility], [address], [place], [place] [address]    Report Status 09/17/2016 FINAL  Final  Culture, blood (routine x 2)     Status: Abnormal   Collection Time: 09/14/16  6:53 PM  Result Value Ref Range Status   Specimen Description BLOOD RIGHT ARM  Final   Special Requests IN PEDIATRIC BOTTLE Blood Culture adequate volume  Final   Culture  Setup Time   Final    GRAM NEGATIVE RODS AEROBIC BOTTLE ONLY CRITICAL RESULT CALLED TO, READ BACK BY AND VERIFIED WITH: Azzie Glatter Pharm.D. 10:05 09/15/16 ([email or web address]) Performed at [facility], [address] [place], [place] [address]     Culture ESCHERICHIA COLI (A)  Final   Report Status 09/17/2016 FINAL  Final   Organism ID, Bacteria ESCHERICHIA COLI  Final      Susceptibility   Escherichia coli - MIC*    AMPICILLIN >=32 RESISTANT Resistant     CEFAZOLIN <=4 SENSITIVE Sensitive     CEFEPIME <=1 SENSITIVE Sensitive     CEFTAZIDIME <=1 SENSITIVE Sensitive     CEFTRIAXONE <=1 SENSITIVE Sensitive     CIPROFLOXACIN >=4 RESISTANT Resistant     GENTAMICIN <=1 SENSITIVE Sensitive     IMIPENEM <=0.25 SENSITIVE Sensitive     TRIMETH/SULFA >=320 RESISTANT Resistant     AMPICILLIN/SULBACTAM 16 INTERMEDIATE Intermediate     PIP/TAZO <=4 SENSITIVE Sensitive     Extended ESBL NEGATIVE Sensitive     * ESCHERICHIA COLI  Blood Culture ID Panel (Reflexed)     Status: Abnormal   Collection Time: 09/14/16  6:53 PM  Result Value Ref Range Status   Enterococcus species NOT DETECTED NOT DETECTED Final   Listeria monocytogenes NOT DETECTED NOT DETECTED Final   Staphylococcus species NOT DETECTED NOT DETECTED Final   Staphylococcus aureus NOT DETECTED NOT DETECTED Final   Streptococcus species NOT DETECTED NOT DETECTED Final   Streptococcus agalactiae NOT DETECTED NOT DETECTED Final   Streptococcus pneumoniae NOT DETECTED NOT DETECTED Final   Streptococcus pyogenes NOT DETECTED NOT DETECTED Final   Acinetobacter baumannii NOT DETECTED NOT DETECTED Final   Enterobacteriaceae species DETECTED (A) NOT DETECTED Final    Comment: Enterobacteriaceae represent a large family of gram-negative bacteria, not a single organism. CRITICAL RESULT CALLED TO, READ BACK BY AND VERIFIED WITH: Azzie Glatter Pharm.D. 10:05 09/15/16 ([email or web address])    Enterobacter cloacae complex NOT DETECTED NOT DETECTED Final   Escherichia coli DETECTED (A) NOT DETECTED Final    Comment: CRITICAL RESULT CALLED TO, READ BACK BY AND VERIFIED WITH: Mardee Postin.D. 10:05 09/15/16 ([email or web address])    Klebsiella oxytoca NOT DETECTED NOT DETECTED Final   Klebsiella pneumoniae NOT DETECTED NOT  DETECTED Final   Proteus species NOT DETECTED NOT DETECTED Final   Serratia marcescens NOT DETECTED NOT DETECTED Final   Carbapenem resistance NOT DETECTED NOT DETECTED Final   Haemophilus influenzae NOT DETECTED NOT DETECTED Final   Neisseria meningitidis NOT DETECTED NOT DETECTED Final   Pseudomonas  aeruginosa NOT DETECTED NOT DETECTED Final   Candida albicans NOT DETECTED NOT DETECTED Final   Candida glabrata NOT DETECTED NOT DETECTED Final   Candida krusei NOT DETECTED NOT DETECTED Final   Candida parapsilosis NOT DETECTED NOT DETECTED Final   Candida tropicalis NOT DETECTED NOT DETECTED Final    Comment: Performed at [facility], [address] [place], [place] [address]  Culture, Urine     Status: Abnormal   Collection Time: 09/14/16  7:23 PM  Result Value Ref Range Status   Specimen Description URINE, RANDOM  Final   Special Requests meropenem  Final   Culture >=100,000 COLONIES/mL ESCHERICHIA COLI (A)  Final   Report Status 09/17/2016 FINAL  Final   Organism ID, Bacteria ESCHERICHIA COLI (A)  Final      Susceptibility   Escherichia coli - MIC*    AMPICILLIN >=32 RESISTANT Resistant     CEFAZOLIN <=4 SENSITIVE Sensitive     CEFTRIAXONE <=1 SENSITIVE Sensitive     CIPROFLOXACIN >=4 RESISTANT Resistant     GENTAMICIN <=1 SENSITIVE Sensitive     IMIPENEM <=0.25 SENSITIVE Sensitive     NITROFURANTOIN <=16 SENSITIVE Sensitive     TRIMETH/SULFA >=320 RESISTANT Resistant     AMPICILLIN/SULBACTAM 16 INTERMEDIATE Intermediate     PIP/TAZO <=4 SENSITIVE Sensitive     Extended ESBL NEGATIVE Sensitive     * >=100,000 COLONIES/mL ESCHERICHIA COLI  Culture, blood (Routine X 2) w Reflex to ID Panel     Status: None (Preliminary result)   Collection Time: 09/16/16 11:45 AM  Result Value Ref Range Status   Specimen Description BLOOD BLOOD RIGHT ARM  Final   Special Requests   Final    BOTTLES DRAWN AEROBIC AND ANAEROBIC Blood Culture adequate volume   Culture   Final    NO  GROWTH < 24 HOURS Performed at [facility], [address] [place], [place] [address]    Report Status PENDING  Incomplete  Culture, blood (Routine X 2) w Reflex to ID Panel     Status: None (Preliminary result)   Collection Time: 09/16/16 11:54 AM  Result Value Ref Range Status   Specimen Description BLOOD BLOOD LEFT HAND  Final   Special Requests IN PEDIATRIC BOTTLE Blood Culture adequate volume  Final   Culture   Final    NO GROWTH < 24 HOURS Performed at [facility], [address] [place], [place] [address]    Report Status PENDING  Incomplete         Radiology Studies: No results found.      Scheduled Meds: . artificial tears   Both Eyes QHS  . chlorhexidine  15 mL Mouth/Throat BID  . heparin  5,000 Units Subcutaneous Q8H  . mometasone-formoterol  2 puff Inhalation BID  . polyvinyl alcohol  1 drop Both Eyes QID  . pregabalin  150 mg Oral BID  . tiotropium  18 mcg Inhalation Daily  . venlafaxine XR  150 mg Oral Q breakfast  . venlafaxine XR  75 mg Oral Q breakfast   Continuous Infusions: . cefTRIAXone (ROCEPHIN)  IV Stopped (09/17/16 1108)  . dextrose 5 % with KCl 20 mEq / L 20 mEq (09/17/16 1320)  . diphenhydrAMINE (BENADRYL) IVPB(SICKLE CELL ONLY)       LOS: 4 days     Justus Duerr Annett Gulaaniel Masaye Gatchalian, MD [facility] Pager [phone]  If 7PM-7AM, please contact night-coverage [email or web address] Password [email or web address] 09/17/2016, 1:34 PM

## 2016-09-17 NOTE — Progress Notes (Signed)
   Subjective/Chief Complaint: No complaints. Resting comfortably   Objective: Vital signs in last 24 hours: Temp:  [98.9 F (37.2 C)-100 F (37.8 C)] 100 F (37.8 C) (07/06 0504) Pulse Rate:  [76-85] 80 (07/06 0504) Resp:  [16] 16 (07/06 0504) BP: (121-137)/(58-71) 121/58 (07/06 0504) SpO2:  [94 %-99 %] 94 % (07/06 0504) FiO2 (%):  [95 %] 95 % (07/06 0803) Weight:  [82.7 kg (182 lb 5.1 oz)] 82.7 kg (182 lb 5.1 oz) (07/06 0655) Last BM Date: 09/16/16  Intake/Output from previous day: 07/05 0701 - 07/06 0700 In: 941.3 [I.V.:911.3] Out: 2740 [Urine:2740] Intake/Output this shift: Total I/O In: 0  Out: 325 [Urine:325]  General appearance: alert and cooperative Resp: clear to auscultation bilaterally Cardio: regular rate and rhythm GI: soft, nontender. minimal output from ostomy  Lab Results:   Recent Labs  09/16/16 0500 09/17/16 0501  WBC 11.4* 9.6  HGB 12.3 13.6  HCT 38.1 42.5  PLT 242 238   BMET  Recent Labs  09/16/16 0500 09/17/16 0501  NA 136 139  K 3.7 4.2  CL 95* 98*  CO2 32 29  GLUCOSE 125* 118*  BUN 9 8  CREATININE 0.54 0.53  CALCIUM 8.8* 8.8*   PT/INR No results for input(s): LABPROT, INR in the last 72 hours. ABG No results for input(s): PHART, HCO3 in the last 72 hours.  Invalid input(s): PCO2, PO2  Studies/Results: No results found.  Anti-infectives: Anti-infectives    Start     Dose/Rate Route Frequency Ordered Stop   09/15/16 1100  cefTRIAXone (ROCEPHIN) 2 g in dextrose 5 % 50 mL IVPB     2 g 100 mL/hr over 30 Minutes Intravenous Every 24 hours 09/15/16 1050     09/14/16 2000  meropenem (MERREM) 1 g in sodium chloride 0.9 % 100 mL IVPB  Status:  Discontinued     1 g 200 mL/hr over 30 Minutes Intravenous Every 8 hours 09/14/16 1846 09/15/16 1050   09/14/16 0945  ciprofloxacin (CIPRO) IVPB 400 mg     400 mg 200 mL/hr over 60 Minutes Intravenous To Radiology 09/14/16 0929 09/14/16 1124      Assessment/Plan: s/p * No  surgery found * would continue bowel rest until she has ostomy output  Ambulate Ventral hernias stable and soft  LOS: 4 days    TOTH III,Navya Timmons S 09/17/2016

## 2016-09-18 LAB — BASIC METABOLIC PANEL
ANION GAP: 10 (ref 5–15)
BUN: 9 mg/dL (ref 6–20)
CO2: 34 mmol/L — AB (ref 22–32)
Calcium: 9.2 mg/dL (ref 8.9–10.3)
Chloride: 100 mmol/L — ABNORMAL LOW (ref 101–111)
Creatinine, Ser: 0.61 mg/dL (ref 0.44–1.00)
GFR calc Af Amer: >60 mL/min (ref >60)
GFR calc non Af Amer: >60 mL/min (ref >60)
GLUCOSE: 112 mg/dL — AB (ref 65–99)
POTASSIUM: 4.2 mmol/L (ref 3.5–5.1)
Sodium: 144 mmol/L (ref 135–145)

## 2016-09-18 NOTE — Clinical Social Work Note (Signed)
No change in current d/c plan. Will return to [facility] when medically stable. Patient is currently NPO with sips for meds.  CSW services will continue to monitor and assist with d/c back to facility when stable.  Lorri Frederickonna T. Jaci LazierCrowder, [place] [phone] (weekend coverage)

## 2016-09-18 NOTE — Progress Notes (Signed)
PROGRESS NOTE    Deborah Young  WUJ:[id number] DOB: July 07, 1945 DOA: 09/13/2016 PCP: Nadara Eaton, MD     Brief Narrative:  71 year old female who presented with a chief complaint of constipation. She does have history of spina bifida, status post cystectomy and conduit diversion, left BKA, right foot multiple toe amputations, diverging colostomy and urostomy, COPD, peripheral neuropathy, history of small bowel structures in the past requiring surgical intervention. She presented that gradually progressive abdominal pain for last 5-6 days prior to hospitalization, associated with nausea and lack of stool at her colostomy bag. On initial physical examination blood pressure 129/66, heart rate 77, temperature 98.5, respiratory rate 18, oxygen saturation 94%, her oral mucosa was moist, her neck was supple,her lungs were clear to auscultation bilaterally, heart S1-S2 present and rhythmic, her abdomen had hypoactive bowel sounds, positive distention,her colostomy bag was empty with no rebound or peritoneal sounds, no lower extremity edema. Left BKA with right foot multiple toe amputations. Imaging suggestive small bowel obstruction. Patient was admitted to the medical floor with a working diagnosis of small bowel obstruction, incidental UPJ stone with hydronephrosis. Patient had a NG tube placed to low intermittent suction, urology placed a left nephrostomy tube    Assessment & Plan:   Principal Problem:   SBO (small bowel obstruction) (HCC) Active Problems:   Essential hypertension, benign   CAD (coronary artery disease)   GERD   1. Partial small bowel obstruction. Positive stool at the ostomy bag, no further abdominal pain, nausea or vomiting, diet has been advanced per surgery recommendations, will follow with response, will hold on IV fluids, follow a restrictive IV fluids strategy.   2. Left UPJ stone with hydronephrosis complicated with urine tract infection and gram negative sepsis,  E coli. (present on admission). Will continue antibiotic therapy with IV ceftriaxone, urine culture with E. Coli, sensitive to cephalosporins but resistant to fluoroquinolones. Patient has remained afebrile, will keep nephrostomy tube for now and follow with urology as outpatient.   3. COPD, continue to be stable with no clinical signs of acuteexacerbation. Continue standard bronchodilator therapy with dulera and tiotropiunm.   4. Spina bifida sp cystectomy/morbid obesity. DVT prophylaxis. Non ambulatory. Will plan to transfer back to [facility] nursing facility.   5. Depression. Continue on venlafaxine.   DVT prophylaxis:enoxaparin  Code Status:DNR Family Communication:No family at the bedside  Disposition Plan:   Consultants:  Urology   Surgery   Procedures:  Left nephrostomy tube    Antimicrobials:   Subjective: Patient tolerating po well, no further abdominal pain, nausea or vomiting, no chest pain or dyspnea.   Objective: Vitals:   09/17/16 1955 09/17/16 2140 09/18/16 0500 09/18/16 0554  BP:  126/61  (!) 144/61  Pulse:  75  70  Resp:  18  16  Temp:  98.6 F (37 C)  98.3 F (36.8 C)  TempSrc:  Oral  Oral  SpO2: 97% 95%  98%  Weight:  84.7 kg (186 lb 11.7 oz) 83 kg (182 lb 15.7 oz)   Height:        Intake/Output Summary (Last 24 hours) at 09/18/16 1144 Last data filed at 09/18/16 0800  Gross per 24 hour  Intake           644.25 ml  Output             1050 ml  Net          -405.75 ml   Filed Weights   09/17/16 0655 09/17/16 2140 09/18/16  0500  Weight: 82.7 kg (182 lb 5.1 oz) 84.7 kg (186 lb 11.7 oz) 83 kg (182 lb 15.7 oz)    Examination:  General exam: deconditioned E ENT: no pallor or icterus, oral mucosa moist.  Respiratory system: Clear to auscultation. Respiratory effort normal. Cardiovascular system: S1 & S2 heard, RRR. No JVD, murmurs, rubs, gallops or clicks. No pedal edema. Gastrointestinal system: Abdomen is nondistended, soft  and nontender. No organomegaly or masses felt. Normal bowel sounds heard. Colostomy and urostomy bag in place. Left nephrostomy tube in place.  Central nervous system: Alert and oriented. No focal neurological deficits. Extremities: Symmetric 5 x 5 power. Skin: No rashes, lesions or ulcers    Data Reviewed: I have personally reviewed following labs and imaging studies  CBC:  Recent Labs Lab 09/13/16 1326 09/14/16 0414 09/16/16 0500 09/17/16 0501  WBC 9.2 8.3 11.4* 9.6  NEUTROABS  --   --  8.4* 6.8  HGB 12.7 12.1 12.3 13.6  HCT 40.5 38.7 38.1 42.5  MCV 92.3 92.6 90.9 91.2  PLT 244 248 242 238   Basic Metabolic Panel:  Recent Labs Lab 09/14/16 0414 09/15/16 1238 09/16/16 0500 09/17/16 0501 09/18/16 0509  NA 142 136 136 139 144  K 4.0 3.6 3.7 4.2 4.2  CL 106 97* 95* 98* 100*  CO2 24 30 32 29 34*  GLUCOSE 106* 138* 125* 118* 112*  BUN 18 9 9 8 9   CREATININE 0.75 0.61 0.54 0.53 0.61  CALCIUM 8.6* 8.6* 8.8* 8.8* 9.2  MG 1.9  --   --   --   --    GFR: Estimated Creatinine Clearance: 61.9 mL/min (by C-G formula based on SCr of 0.61 mg/dL). Liver Function Tests:  Recent Labs Lab 09/13/16 1326  AST 44*  ALT 22  ALKPHOS 166*  BILITOT 1.1  PROT 7.4  ALBUMIN 3.0*    Recent Labs Lab 09/13/16 1326  LIPASE 16   No results for input(s): AMMONIA in the last 168 hours. Coagulation Profile:  Recent Labs Lab 09/14/16 0414  INR 1.41   Cardiac Enzymes: No results for input(s): CKTOTAL, CKMB, CKMBINDEX, TROPONINI in the last 168 hours. BNP (last 3 results) No results for input(s): PROBNP in the last 8760 hours. HbA1C: No results for input(s): HGBA1C in the last 72 hours. CBG: No results for input(s): GLUCAP in the last 168 hours. Lipid Profile: No results for input(s): CHOL, HDL, LDLCALC, TRIG, CHOLHDL, LDLDIRECT in the last 72 hours. Thyroid Function Tests: No results for input(s): TSH, T4TOTAL, FREET4, T3FREE, THYROIDAB in the last 72 hours. Anemia  Panel: No results for input(s): VITAMINB12, FOLATE, FERRITIN, TIBC, IRON, RETICCTPCT in the last 72 hours. Sepsis Labs:  Recent Labs Lab 09/13/16 1657  LATICACIDVEN 0.79    Recent Results (from the past 240 hour(s))  MRSA PCR Screening     Status: None   Collection Time: 09/14/16  6:32 AM  Result Value Ref Range Status   MRSA by PCR NEGATIVE NEGATIVE Final    Comment:        The GeneXpert MRSA Assay (FDA approved for NASAL specimens only), is one component of a comprehensive MRSA colonization surveillance program. It is not intended to diagnose MRSA infection nor to guide or monitor treatment for MRSA infections.   Culture, blood (routine x 2)     Status: Abnormal   Collection Time: 09/14/16  6:53 PM  Result Value Ref Range Status   Specimen Description BLOOD RIGHT ARM  Final   Special Requests  Final    BOTTLES DRAWN AEROBIC AND ANAEROBIC Blood Culture adequate volume   Culture  Setup Time   Final    GRAM NEGATIVE RODS AEROBIC BOTTLE ONLY CRITICAL VALUE NOTED.  VALUE IS CONSISTENT WITH PREVIOUSLY REPORTED AND CALLED VALUE.    Culture (A)  Final    ESCHERICHIA COLI SUSCEPTIBILITIES PERFORMED ON PREVIOUS CULTURE WITHIN THE LAST 5 DAYS. Performed at [facility], [address] [place], [place] [address]    Report Status 09/17/2016 FINAL  Final  Culture, blood (routine x 2)     Status: Abnormal   Collection Time: 09/14/16  6:53 PM  Result Value Ref Range Status   Specimen Description BLOOD RIGHT ARM  Final   Special Requests IN PEDIATRIC BOTTLE Blood Culture adequate volume  Final   Culture  Setup Time   Final    GRAM NEGATIVE RODS AEROBIC BOTTLE ONLY CRITICAL RESULT CALLED TO, READ BACK BY AND VERIFIED WITH: Azzie GlatterJ. Legge Pharm.D. 10:05 09/15/16 ([email or web address]) Performed at [facility], [address] [place], [place] [address]    Culture ESCHERICHIA COLI (A)  Final   Report Status 09/17/2016 FINAL  Final   Organism ID, Bacteria ESCHERICHIA COLI  Final       Susceptibility   Escherichia coli - MIC*    AMPICILLIN >=32 RESISTANT Resistant     CEFAZOLIN <=4 SENSITIVE Sensitive     CEFEPIME <=1 SENSITIVE Sensitive     CEFTAZIDIME <=1 SENSITIVE Sensitive     CEFTRIAXONE <=1 SENSITIVE Sensitive     CIPROFLOXACIN >=4 RESISTANT Resistant     GENTAMICIN <=1 SENSITIVE Sensitive     IMIPENEM <=0.25 SENSITIVE Sensitive     TRIMETH/SULFA >=320 RESISTANT Resistant     AMPICILLIN/SULBACTAM 16 INTERMEDIATE Intermediate     PIP/TAZO <=4 SENSITIVE Sensitive     Extended ESBL NEGATIVE Sensitive     * ESCHERICHIA COLI  Blood Culture ID Panel (Reflexed)     Status: Abnormal   Collection Time: 09/14/16  6:53 PM  Result Value Ref Range Status   Enterococcus species NOT DETECTED NOT DETECTED Final   Listeria monocytogenes NOT DETECTED NOT DETECTED Final   Staphylococcus species NOT DETECTED NOT DETECTED Final   Staphylococcus aureus NOT DETECTED NOT DETECTED Final   Streptococcus species NOT DETECTED NOT DETECTED Final   Streptococcus agalactiae NOT DETECTED NOT DETECTED Final   Streptococcus pneumoniae NOT DETECTED NOT DETECTED Final   Streptococcus pyogenes NOT DETECTED NOT DETECTED Final   Acinetobacter baumannii NOT DETECTED NOT DETECTED Final   Enterobacteriaceae species DETECTED (A) NOT DETECTED Final    Comment: Enterobacteriaceae represent a large family of gram-negative bacteria, not a single organism. CRITICAL RESULT CALLED TO, READ BACK BY AND VERIFIED WITH: Azzie GlatterJ. Legge Pharm.D. 10:05 09/15/16 ([email or web address])    Enterobacter cloacae complex NOT DETECTED NOT DETECTED Final   Escherichia coli DETECTED (A) NOT DETECTED Final    Comment: CRITICAL RESULT CALLED TO, READ BACK BY AND VERIFIED WITH: Mardee PostinJ. Legge Pharm.D. 10:05 09/15/16 ([email or web address])    Klebsiella oxytoca NOT DETECTED NOT DETECTED Final   Klebsiella pneumoniae NOT DETECTED NOT DETECTED Final   Proteus species NOT DETECTED NOT DETECTED Final   Serratia marcescens NOT DETECTED NOT DETECTED Final    Carbapenem resistance NOT DETECTED NOT DETECTED Final   Haemophilus influenzae NOT DETECTED NOT DETECTED Final   Neisseria meningitidis NOT DETECTED NOT DETECTED Final   Pseudomonas aeruginosa NOT DETECTED NOT DETECTED Final   Candida albicans NOT DETECTED NOT DETECTED Final   Candida glabrata NOT  DETECTED NOT DETECTED Final   Candida krusei NOT DETECTED NOT DETECTED Final   Candida parapsilosis NOT DETECTED NOT DETECTED Final   Candida tropicalis NOT DETECTED NOT DETECTED Final    Comment: Performed at [facility], [address] [place], [place] [address]  Culture, Urine     Status: Abnormal   Collection Time: 09/14/16  7:23 PM  Result Value Ref Range Status   Specimen Description URINE, RANDOM  Final   Special Requests meropenem  Final   Culture >=100,000 COLONIES/mL ESCHERICHIA COLI (A)  Final   Report Status 09/17/2016 FINAL  Final   Organism ID, Bacteria ESCHERICHIA COLI (A)  Final      Susceptibility   Escherichia coli - MIC*    AMPICILLIN >=32 RESISTANT Resistant     CEFAZOLIN <=4 SENSITIVE Sensitive     CEFTRIAXONE <=1 SENSITIVE Sensitive     CIPROFLOXACIN >=4 RESISTANT Resistant     GENTAMICIN <=1 SENSITIVE Sensitive     IMIPENEM <=0.25 SENSITIVE Sensitive     NITROFURANTOIN <=16 SENSITIVE Sensitive     TRIMETH/SULFA >=320 RESISTANT Resistant     AMPICILLIN/SULBACTAM 16 INTERMEDIATE Intermediate     PIP/TAZO <=4 SENSITIVE Sensitive     Extended ESBL NEGATIVE Sensitive     * >=100,000 COLONIES/mL ESCHERICHIA COLI  Culture, blood (Routine X 2) w Reflex to ID Panel     Status: None (Preliminary result)   Collection Time: 09/16/16 11:45 AM  Result Value Ref Range Status   Specimen Description BLOOD BLOOD RIGHT ARM  Final   Special Requests   Final    BOTTLES DRAWN AEROBIC AND ANAEROBIC Blood Culture adequate volume   Culture   Final    NO GROWTH 2 DAYS Performed at [facility], [address] [place], [place] [address]    Report Status PENDING   Incomplete  Culture, blood (Routine X 2) w Reflex to ID Panel     Status: None (Preliminary result)   Collection Time: 09/16/16 11:54 AM  Result Value Ref Range Status   Specimen Description BLOOD BLOOD LEFT HAND  Final   Special Requests IN PEDIATRIC BOTTLE Blood Culture adequate volume  Final   Culture   Final    NO GROWTH 2 DAYS Performed at [facility], [address], [place], [place] [address]    Report Status PENDING  Incomplete         Radiology Studies: No results found.      Scheduled Meds: . artificial tears   Both Eyes QHS  . chlorhexidine  15 mL Mouth/Throat BID  . heparin  5,000 Units Subcutaneous Q8H  . mometasone-formoterol  2 puff Inhalation BID  . polyvinyl alcohol  1 drop Both Eyes QID  . pregabalin  150 mg Oral BID  . tiotropium  18 mcg Inhalation Daily  . venlafaxine XR  150 mg Oral Q breakfast  . venlafaxine XR  75 mg Oral Q breakfast   Continuous Infusions: . cefTRIAXone (ROCEPHIN)  IV Stopped (09/17/16 1108)  . dextrose 5 % and 0.9% NaCl 75 mL/hr at 09/18/16 0845  . diphenhydrAMINE (BENADRYL) IVPB(SICKLE CELL ONLY)       LOS: 5 days        Breanda Greenlaw Annett Gula, MD [facility] Pager [phone]  If 7PM-7AM, please contact night-coverage [email or web address] Password [email or web address] 09/18/2016, 11:44 AM

## 2016-09-18 NOTE — Progress Notes (Signed)
Subjective: Patient reports no GU related c/os  Objective: Vital signs in last 24 hours: Temp:  [98.3 F (36.8 C)-98.6 F (37 C)] 98.3 F (36.8 C) (07/07 0554) Pulse Rate:  [64-75] 70 (07/07 0554) Resp:  [16-18] 16 (07/07 0554) BP: (105-144)/(49-61) 144/61 (07/07 0554) SpO2:  [95 %-98 %] 98 % (07/07 0554) Weight:  [83 kg (182 lb 15.7 oz)-84.7 kg (186 lb 11.7 oz)] 83 kg (182 lb 15.7 oz) (07/07 0500)  Intake/Output from previous day: 07/06 0701 - 07/07 0700 In: 694.3 [P.O.:63; I.V.:581.3; IV Piggyback:50] Out: 1175 [Urine:1175] Intake/Output this shift: No intake/output data recorded.  Physical Exam:  Constitutional: Vital signs reviewed. WD WN in NAD   Eyes: PERRL, No scleral icterus.    Urine in nephrostomy bag clear yellow  Lab Results:  Recent Labs  09/16/16 0500 09/17/16 0501  HGB 12.3 13.6  HCT 38.1 42.5   BMET  Recent Labs  09/17/16 0501 09/18/16 0509  NA 139 144  K 4.2 4.2  CL 98* 100*  CO2 29 34*  GLUCOSE 118* 112*  BUN 8 9  CREATININE 0.53 0.61  CALCIUM 8.8* 9.2   No results for input(s): LABPT, INR in the last 72 hours. No results for input(s): LABURIN in the last 72 hours. Results for orders placed or performed during the hospital encounter of 09/13/16  MRSA PCR Screening     Status: None   Collection Time: 09/14/16  6:32 AM  Result Value Ref Range Status   MRSA by PCR NEGATIVE NEGATIVE Final    Comment:        The GeneXpert MRSA Assay (FDA approved for NASAL specimens only), is one component of a comprehensive MRSA colonization surveillance program. It is not intended to diagnose MRSA infection nor to guide or monitor treatment for MRSA infections.   Culture, blood (routine x 2)     Status: Abnormal   Collection Time: 09/14/16  6:53 PM  Result Value Ref Range Status   Specimen Description BLOOD RIGHT ARM  Final   Special Requests   Final    BOTTLES DRAWN AEROBIC AND ANAEROBIC Blood Culture adequate volume   Culture  Setup Time    Final    GRAM NEGATIVE RODS AEROBIC BOTTLE ONLY CRITICAL VALUE NOTED.  VALUE IS CONSISTENT WITH PREVIOUSLY REPORTED AND CALLED VALUE.    Culture (A)  Final    ESCHERICHIA COLI SUSCEPTIBILITIES PERFORMED ON PREVIOUS CULTURE WITHIN THE LAST 5 DAYS. Performed at [facility], [address] [place], [place] [address]    Report Status 09/17/2016 FINAL  Final  Culture, blood (routine x 2)     Status: Abnormal   Collection Time: 09/14/16  6:53 PM  Result Value Ref Range Status   Specimen Description BLOOD RIGHT ARM  Final   Special Requests IN PEDIATRIC BOTTLE Blood Culture adequate volume  Final   Culture  Setup Time   Final    GRAM NEGATIVE RODS AEROBIC BOTTLE ONLY CRITICAL RESULT CALLED TO, READ BACK BY AND VERIFIED WITH: Azzie GlatterJ. Legge Pharm.D. 10:05 09/15/16 ([email or web address]) Performed at [facility], [address] [place], [place] [address]    Culture ESCHERICHIA COLI (A)  Final   Report Status 09/17/2016 FINAL  Final   Organism ID, Bacteria ESCHERICHIA COLI  Final      Susceptibility   Escherichia coli - MIC*    AMPICILLIN >=32 RESISTANT Resistant     CEFAZOLIN <=4 SENSITIVE Sensitive     CEFEPIME <=1 SENSITIVE Sensitive  CEFTAZIDIME <=1 SENSITIVE Sensitive     CEFTRIAXONE <=1 SENSITIVE Sensitive     CIPROFLOXACIN >=4 RESISTANT Resistant     GENTAMICIN <=1 SENSITIVE Sensitive     IMIPENEM <=0.25 SENSITIVE Sensitive     TRIMETH/SULFA >=320 RESISTANT Resistant     AMPICILLIN/SULBACTAM 16 INTERMEDIATE Intermediate     PIP/TAZO <=4 SENSITIVE Sensitive     Extended ESBL NEGATIVE Sensitive     * ESCHERICHIA COLI  Blood Culture ID Panel (Reflexed)     Status: Abnormal   Collection Time: 09/14/16  6:53 PM  Result Value Ref Range Status   Enterococcus species NOT DETECTED NOT DETECTED Final   Listeria monocytogenes NOT DETECTED NOT DETECTED Final   Staphylococcus species NOT DETECTED NOT DETECTED Final   Staphylococcus aureus NOT DETECTED NOT DETECTED Final   Streptococcus  species NOT DETECTED NOT DETECTED Final   Streptococcus agalactiae NOT DETECTED NOT DETECTED Final   Streptococcus pneumoniae NOT DETECTED NOT DETECTED Final   Streptococcus pyogenes NOT DETECTED NOT DETECTED Final   Acinetobacter baumannii NOT DETECTED NOT DETECTED Final   Enterobacteriaceae species DETECTED (A) NOT DETECTED Final    Comment: Enterobacteriaceae represent a large family of gram-negative bacteria, not a single organism. CRITICAL RESULT CALLED TO, READ BACK BY AND VERIFIED WITH: Azzie Glatter Pharm.D. 10:05 09/15/16 ([email or web address])    Enterobacter cloacae complex NOT DETECTED NOT DETECTED Final   Escherichia coli DETECTED (A) NOT DETECTED Final    Comment: CRITICAL RESULT CALLED TO, READ BACK BY AND VERIFIED WITH: Mardee Postin.D. 10:05 09/15/16 ([email or web address])    Klebsiella oxytoca NOT DETECTED NOT DETECTED Final   Klebsiella pneumoniae NOT DETECTED NOT DETECTED Final   Proteus species NOT DETECTED NOT DETECTED Final   Serratia marcescens NOT DETECTED NOT DETECTED Final   Carbapenem resistance NOT DETECTED NOT DETECTED Final   Haemophilus influenzae NOT DETECTED NOT DETECTED Final   Neisseria meningitidis NOT DETECTED NOT DETECTED Final   Pseudomonas aeruginosa NOT DETECTED NOT DETECTED Final   Candida albicans NOT DETECTED NOT DETECTED Final   Candida glabrata NOT DETECTED NOT DETECTED Final   Candida krusei NOT DETECTED NOT DETECTED Final   Candida parapsilosis NOT DETECTED NOT DETECTED Final   Candida tropicalis NOT DETECTED NOT DETECTED Final    Comment: Performed at [facility], [address], [place], [place] [address]  Culture, Urine     Status: Abnormal   Collection Time: 09/14/16  7:23 PM  Result Value Ref Range Status   Specimen Description URINE, RANDOM  Final   Special Requests meropenem  Final   Culture >=100,000 COLONIES/mL ESCHERICHIA COLI (A)  Final   Report Status 09/17/2016 FINAL  Final   Organism ID, Bacteria ESCHERICHIA COLI (A)  Final      Susceptibility    Escherichia coli - MIC*    AMPICILLIN >=32 RESISTANT Resistant     CEFAZOLIN <=4 SENSITIVE Sensitive     CEFTRIAXONE <=1 SENSITIVE Sensitive     CIPROFLOXACIN >=4 RESISTANT Resistant     GENTAMICIN <=1 SENSITIVE Sensitive     IMIPENEM <=0.25 SENSITIVE Sensitive     NITROFURANTOIN <=16 SENSITIVE Sensitive     TRIMETH/SULFA >=320 RESISTANT Resistant     AMPICILLIN/SULBACTAM 16 INTERMEDIATE Intermediate     PIP/TAZO <=4 SENSITIVE Sensitive     Extended ESBL NEGATIVE Sensitive     * >=100,000 COLONIES/mL ESCHERICHIA COLI  Culture, blood (Routine X 2) w Reflex to ID Panel     Status: None (Preliminary result)   Collection Time: 09/16/16 11:45 AM  Result Value Ref Range Status   Specimen Description BLOOD BLOOD RIGHT ARM  Final   Special Requests   Final    BOTTLES DRAWN AEROBIC AND ANAEROBIC Blood Culture adequate volume   Culture   Final    NO GROWTH < 24 HOURS Performed at [facility], [address] [place], [place] [address]    Report Status PENDING  Incomplete  Culture, blood (Routine X 2) w Reflex to ID Panel     Status: None (Preliminary result)   Collection Time: 09/16/16 11:54 AM  Result Value Ref Range Status   Specimen Description BLOOD BLOOD LEFT HAND  Final   Special Requests IN PEDIATRIC BOTTLE Blood Culture adequate volume  Final   Culture   Final    NO GROWTH < 24 HOURS Performed at [facility], [address], [place], [place] [address]    Report Status PENDING  Incomplete    Studies/Results: No results found.  Assessment/Plan:   S/P lt pcn for obstructing stone. No related issues currently. GU followup arranged. Call for further GU issues   LOS: 5 days   Marcine Matar M 09/18/2016, 8:54 AM

## 2016-09-18 NOTE — Progress Notes (Signed)
  General Surgery [facility]  Assessment & Plan: HD#6 - small bowel obstruction  NPO, denies nausea or emesis  Will give trial clear liquids today  OOB to chair please  Per medical service, IR, urology        Velora Hecklerodd M. Erandi Lemma, MD, [facility]       [facility]       Office: [phone]    Chief Complaint: Abd pain, small bowel obstruction  Subjective: Patient complains of mild mid abdominal pain.  Wants to eat.  Denies nausea or emesis.  Objective: Vital signs in last 24 hours: Temp:  [98.3 F (36.8 C)-98.6 F (37 C)] 98.3 F (36.8 C) (07/07 0554) Pulse Rate:  [64-75] 70 (07/07 0554) Resp:  [16-18] 16 (07/07 0554) BP: (105-144)/(49-61) 144/61 (07/07 0554) SpO2:  [95 %-98 %] 98 % (07/07 0554) Weight:  [83 kg (182 lb 15.7 oz)-84.7 kg (186 lb 11.7 oz)] 83 kg (182 lb 15.7 oz) (07/07 0500) Last BM Date: 09/16/16  Intake/Output from previous day: 07/06 0701 - 07/07 0700 In: 694.3 [P.O.:63; I.V.:581.3; IV Piggyback:50] Out: 1175 [Urine:1175] Intake/Output this shift: No intake/output data recorded.  Physical Exam: HEENT - sclerae clear, mucous membranes moist Neck - soft Chest - clear bilaterally Cor - RRR Abdomen - soft, obese; urostomy RLQ; colostomy LLQ with small stool in bag, no flatus; herniae soft and reducible; minimal tenderness; BS present  Lab Results:   Recent Labs  09/16/16 0500 09/17/16 0501  WBC 11.4* 9.6  HGB 12.3 13.6  HCT 38.1 42.5  PLT 242 238   BMET  Recent Labs  09/17/16 0501 09/18/16 0509  NA 139 144  K 4.2 4.2  CL 98* 100*  CO2 29 34*  GLUCOSE 118* 112*  BUN 8 9  CREATININE 0.53 0.61  CALCIUM 8.8* 9.2   PT/INR No results for input(s): LABPROT, INR in the last 72 hours. Comprehensive Metabolic Panel:    Component Value Date/Time   NA 144 09/18/2016 0509   NA 139 09/17/2016 0501   K 4.2 09/18/2016 0509   K 4.2 09/17/2016 0501   CL 100 (L) 09/18/2016 0509   CL 98 (L) 09/17/2016 0501   CO2 34 (H) 09/18/2016 0509   CO2 29 09/17/2016 0501   BUN 9 09/18/2016 0509   BUN 8 09/17/2016 0501   CREATININE 0.61 09/18/2016 0509   CREATININE 0.53 09/17/2016 0501   GLUCOSE 112 (H) 09/18/2016 0509   GLUCOSE 118 (H) 09/17/2016 0501   CALCIUM 9.2 09/18/2016 0509   CALCIUM 8.8 (L) 09/17/2016 0501   AST 44 (H) 09/13/2016 1326   AST 23 01/15/2010 0245   ALT 22 09/13/2016 1326   ALT 23 01/15/2010 0245   ALKPHOS 166 (H) 09/13/2016 1326   ALKPHOS 96 01/15/2010 0245   BILITOT 1.1 09/13/2016 1326   BILITOT 0.4 01/15/2010 0245   PROT 7.4 09/13/2016 1326   PROT 6.8 01/15/2010 0245   ALBUMIN 3.0 (L) 09/13/2016 1326   ALBUMIN 3.2 (L) 01/15/2010 0245    Studies/Results: No results found.    Harshan Kearley M 09/18/2016  Patient ID: Deborah Young, female   DOB: 1945/10/13, 71 y.o.   MRN: [id number]

## 2016-09-19 DIAGNOSIS — I2510 Atherosclerotic heart disease of native coronary artery without angina pectoris: Secondary | ICD-10-CM

## 2016-09-19 NOTE — Progress Notes (Signed)
PROGRESS NOTE    Deborah Young  ZOX:[id number] DOB: 11-03-1945 DOA: 09/13/2016 PCP: Nadara Eaton, MD     Brief Narrative:  71 year old female who presented with a chief complaint of constipation. She does have history of spina bifida, status post cystectomy and conduit diversion, left BKA, right foot multiple toe amputations, diverging colostomy and urostomy, COPD, peripheral neuropathy, history of small bowel structures in the past requiring surgical intervention. She presented that gradually progressive abdominal pain for last 5-6 days prior to hospitalization, associated with nausea and lack of stool at her colostomy bag. On initial physical examination blood pressure 129/66, heart rate 77, temperature 98.5, respiratory rate 18, oxygen saturation 94%, her oral mucosa was moist, her neck was supple,her lungs were clear to auscultation bilaterally, heart S1-S2 present and rhythmic, her abdomen had hypoactive bowel sounds, positive distention,her colostomy bag was empty with no rebound or peritoneal sounds, no lower extremity edema. Left BKA with right foot multiple toe amputations. Imaging suggestive small bowel obstruction. Patient was admitted to the medical floor with a working diagnosis of small bowel obstruction, incidental UPJ stone with hydronephrosis. Patient had a NG tube placed to low intermittent suction, urology placed a left nephrostomy tube. Patient responded well to medical supportive therapy, NG tube removed and advance diet with good toleration.    Assessment & Plan:   Principal Problem:   SBO (small bowel obstruction) (HCC) Active Problems:   Essential hypertension, benign   CAD (coronary artery disease)   GERD   1. Partial small bowel obstruction. Continue to improve output from colostomy bag, diet has been advanced, if continue good toleration will plan to discharge in am, case discussed with Dr. Gerrit Friends from surgery. Off IV fluids.  2. Left UPJ stone with  hydronephrosis complicated with urine tract infection and gram negative sepsis, E coli.(present on admission).  IV ceftriaxone #4, nephrostomy tube in placed with good urine output. Will need outpatient follow up with urology.   3. COPD, No signs of acuteexacerbation. On bronchodilator therapy with tiotropium and dulera.    4. Spina bifida sp cystectomy/morbid obesity. Patient on DVT prophylaxis.   5. Depression. Onvenlafaxine, per home regimen.   DVT prophylaxis:enoxaparin  Code Status:DNR Family Communication:No family at the bedside  Disposition Plan:   Consultants:  Urology   Surgery   Procedures:  Left nephrostomy tube    Antimicrobials:   Subjective: Patient feeling better, no nausea or vomiting, no chest pain or dyspnea.   Objective: Vitals:   09/18/16 2130 09/18/16 2200 09/19/16 0529 09/19/16 0754  BP: (!) 150/65  (!) 152/66   Pulse: 68  66   Resp: 16 18 18    Temp: 98.7 F (37.1 C)  98.7 F (37.1 C)   TempSrc: Oral  Oral   SpO2: 97%  98%   Weight:   83.9 kg (184 lb 15.5 oz) 83.6 kg (184 lb 4.9 oz)  Height:        Intake/Output Summary (Last 24 hours) at 09/19/16 0851 Last data filed at 09/19/16 0753  Gross per 24 hour  Intake              770 ml  Output             1700 ml  Net             -930 ml   Filed Weights   09/18/16 0500 09/19/16 0529 09/19/16 0754  Weight: 83 kg (182 lb 15.7 oz) 83.9 kg (184 lb 15.5 oz) 83.6 kg (  184 lb 4.9 oz)    Examination:  General exam: deconditioned E ENT: mild pallor, no icterus, oral mucosa moist. Respiratory system: Clear to auscultation. Respiratory effort normal. No wheezing, rales or rhonchi.  Cardiovascular system: S1 & S2 heard, RRR. No JVD, murmurs, rubs, gallops or clicks. No pedal edema. Gastrointestinal system: Abdomen is nondistended, soft and nontender. No organomegaly or masses felt. Normal bowel sounds heard. Positive stool on colostomy bag, positive clear urine on urostomy bag,  collecting bag from nephrostomy clear.  Central nervous system: Alert and oriented. No focal neurological deficits. Extremities: Symmetric 5 x 5 power. Skin: No rashes, lesions or ulcers     Data Reviewed: I have personally reviewed following labs and imaging studies  CBC:  Recent Labs Lab 09/13/16 1326 09/14/16 0414 09/16/16 0500 09/17/16 0501  WBC 9.2 8.3 11.4* 9.6  NEUTROABS  --   --  8.4* 6.8  HGB 12.7 12.1 12.3 13.6  HCT 40.5 38.7 38.1 42.5  MCV 92.3 92.6 90.9 91.2  PLT 244 248 242 238   Basic Metabolic Panel:  Recent Labs Lab 09/14/16 0414 09/15/16 1238 09/16/16 0500 09/17/16 0501 09/18/16 0509  NA 142 136 136 139 144  K 4.0 3.6 3.7 4.2 4.2  CL 106 97* 95* 98* 100*  CO2 24 30 32 29 34*  GLUCOSE 106* 138* 125* 118* 112*  BUN 18 9 9 8 9   CREATININE 0.75 0.61 0.54 0.53 0.61  CALCIUM 8.6* 8.6* 8.8* 8.8* 9.2  MG 1.9  --   --   --   --    GFR: Estimated Creatinine Clearance: 62.2 mL/min (by C-G formula based on SCr of 0.61 mg/dL). Liver Function Tests:  Recent Labs Lab 09/13/16 1326  AST 44*  ALT 22  ALKPHOS 166*  BILITOT 1.1  PROT 7.4  ALBUMIN 3.0*    Recent Labs Lab 09/13/16 1326  LIPASE 16   No results for input(s): AMMONIA in the last 168 hours. Coagulation Profile:  Recent Labs Lab 09/14/16 0414  INR 1.41   Cardiac Enzymes: No results for input(s): CKTOTAL, CKMB, CKMBINDEX, TROPONINI in the last 168 hours. BNP (last 3 results) No results for input(s): PROBNP in the last 8760 hours. HbA1C: No results for input(s): HGBA1C in the last 72 hours. CBG: No results for input(s): GLUCAP in the last 168 hours. Lipid Profile: No results for input(s): CHOL, HDL, LDLCALC, TRIG, CHOLHDL, LDLDIRECT in the last 72 hours. Thyroid Function Tests: No results for input(s): TSH, T4TOTAL, FREET4, T3FREE, THYROIDAB in the last 72 hours. Anemia Panel: No results for input(s): VITAMINB12, FOLATE, FERRITIN, TIBC, IRON, RETICCTPCT in the last 72  hours. Sepsis Labs:  Recent Labs Lab 09/13/16 1657  LATICACIDVEN 0.79    Recent Results (from the past 240 hour(s))  MRSA PCR Screening     Status: None   Collection Time: 09/14/16  6:32 AM  Result Value Ref Range Status   MRSA by PCR NEGATIVE NEGATIVE Final    Comment:        The GeneXpert MRSA Assay (FDA approved for NASAL specimens only), is one component of a comprehensive MRSA colonization surveillance program. It is not intended to diagnose MRSA infection nor to guide or monitor treatment for MRSA infections.   Culture, blood (routine x 2)     Status: Abnormal   Collection Time: 09/14/16  6:53 PM  Result Value Ref Range Status   Specimen Description BLOOD RIGHT ARM  Final   Special Requests   Final    BOTTLES DRAWN  AEROBIC AND ANAEROBIC Blood Culture adequate volume   Culture  Setup Time   Final    GRAM NEGATIVE RODS AEROBIC BOTTLE ONLY CRITICAL VALUE NOTED.  VALUE IS CONSISTENT WITH PREVIOUSLY REPORTED AND CALLED VALUE.    Culture (A)  Final    ESCHERICHIA COLI SUSCEPTIBILITIES PERFORMED ON PREVIOUS CULTURE WITHIN THE LAST 5 DAYS. Performed at [facility], [address] [place], [place] [address]    Report Status 09/17/2016 FINAL  Final  Culture, blood (routine x 2)     Status: Abnormal   Collection Time: 09/14/16  6:53 PM  Result Value Ref Range Status   Specimen Description BLOOD RIGHT ARM  Final   Special Requests IN PEDIATRIC BOTTLE Blood Culture adequate volume  Final   Culture  Setup Time   Final    GRAM NEGATIVE RODS AEROBIC BOTTLE ONLY CRITICAL RESULT CALLED TO, READ BACK BY AND VERIFIED WITH: Azzie GlatterJ. Legge Pharm.D. 10:05 09/15/16 ([email or web address]) Performed at [facility], [address] [place], [place] [address]    Culture ESCHERICHIA COLI (A)  Final   Report Status 09/17/2016 FINAL  Final   Organism ID, Bacteria ESCHERICHIA COLI  Final      Susceptibility   Escherichia coli - MIC*    AMPICILLIN >=32 RESISTANT Resistant     CEFAZOLIN <=4  SENSITIVE Sensitive     CEFEPIME <=1 SENSITIVE Sensitive     CEFTAZIDIME <=1 SENSITIVE Sensitive     CEFTRIAXONE <=1 SENSITIVE Sensitive     CIPROFLOXACIN >=4 RESISTANT Resistant     GENTAMICIN <=1 SENSITIVE Sensitive     IMIPENEM <=0.25 SENSITIVE Sensitive     TRIMETH/SULFA >=320 RESISTANT Resistant     AMPICILLIN/SULBACTAM 16 INTERMEDIATE Intermediate     PIP/TAZO <=4 SENSITIVE Sensitive     Extended ESBL NEGATIVE Sensitive     * ESCHERICHIA COLI  Blood Culture ID Panel (Reflexed)     Status: Abnormal   Collection Time: 09/14/16  6:53 PM  Result Value Ref Range Status   Enterococcus species NOT DETECTED NOT DETECTED Final   Listeria monocytogenes NOT DETECTED NOT DETECTED Final   Staphylococcus species NOT DETECTED NOT DETECTED Final   Staphylococcus aureus NOT DETECTED NOT DETECTED Final   Streptococcus species NOT DETECTED NOT DETECTED Final   Streptococcus agalactiae NOT DETECTED NOT DETECTED Final   Streptococcus pneumoniae NOT DETECTED NOT DETECTED Final   Streptococcus pyogenes NOT DETECTED NOT DETECTED Final   Acinetobacter baumannii NOT DETECTED NOT DETECTED Final   Enterobacteriaceae species DETECTED (A) NOT DETECTED Final    Comment: Enterobacteriaceae represent a large family of gram-negative bacteria, not a single organism. CRITICAL RESULT CALLED TO, READ BACK BY AND VERIFIED WITH: Azzie GlatterJ. Legge Pharm.D. 10:05 09/15/16 ([email or web address])    Enterobacter cloacae complex NOT DETECTED NOT DETECTED Final   Escherichia coli DETECTED (A) NOT DETECTED Final    Comment: CRITICAL RESULT CALLED TO, READ BACK BY AND VERIFIED WITH: Mardee PostinJ. Legge Pharm.D. 10:05 09/15/16 ([email or web address])    Klebsiella oxytoca NOT DETECTED NOT DETECTED Final   Klebsiella pneumoniae NOT DETECTED NOT DETECTED Final   Proteus species NOT DETECTED NOT DETECTED Final   Serratia marcescens NOT DETECTED NOT DETECTED Final   Carbapenem resistance NOT DETECTED NOT DETECTED Final   Haemophilus influenzae NOT DETECTED NOT DETECTED  Final   Neisseria meningitidis NOT DETECTED NOT DETECTED Final   Pseudomonas aeruginosa NOT DETECTED NOT DETECTED Final   Candida albicans NOT DETECTED NOT DETECTED Final   Candida glabrata NOT DETECTED NOT DETECTED Final  Candida krusei NOT DETECTED NOT DETECTED Final   Candida parapsilosis NOT DETECTED NOT DETECTED Final   Candida tropicalis NOT DETECTED NOT DETECTED Final    Comment: Performed at [facility], [address], [place], [place] [address]  Culture, Urine     Status: Abnormal   Collection Time: 09/14/16  7:23 PM  Result Value Ref Range Status   Specimen Description URINE, RANDOM  Final   Special Requests meropenem  Final   Culture >=100,000 COLONIES/mL ESCHERICHIA COLI (A)  Final   Report Status 09/17/2016 FINAL  Final   Organism ID, Bacteria ESCHERICHIA COLI (A)  Final      Susceptibility   Escherichia coli - MIC*    AMPICILLIN >=32 RESISTANT Resistant     CEFAZOLIN <=4 SENSITIVE Sensitive     CEFTRIAXONE <=1 SENSITIVE Sensitive     CIPROFLOXACIN >=4 RESISTANT Resistant     GENTAMICIN <=1 SENSITIVE Sensitive     IMIPENEM <=0.25 SENSITIVE Sensitive     NITROFURANTOIN <=16 SENSITIVE Sensitive     TRIMETH/SULFA >=320 RESISTANT Resistant     AMPICILLIN/SULBACTAM 16 INTERMEDIATE Intermediate     PIP/TAZO <=4 SENSITIVE Sensitive     Extended ESBL NEGATIVE Sensitive     * >=100,000 COLONIES/mL ESCHERICHIA COLI  Culture, blood (Routine X 2) w Reflex to ID Panel     Status: None (Preliminary result)   Collection Time: 09/16/16 11:45 AM  Result Value Ref Range Status   Specimen Description BLOOD BLOOD RIGHT ARM  Final   Special Requests   Final    BOTTLES DRAWN AEROBIC AND ANAEROBIC Blood Culture adequate volume   Culture   Final    NO GROWTH 2 DAYS Performed at [facility], [address] [place], [place] [address]    Report Status PENDING  Incomplete  Culture, blood (Routine X 2) w Reflex to ID Panel     Status: None (Preliminary result)   Collection  Time: 09/16/16 11:54 AM  Result Value Ref Range Status   Specimen Description BLOOD BLOOD LEFT HAND  Final   Special Requests IN PEDIATRIC BOTTLE Blood Culture adequate volume  Final   Culture   Final    NO GROWTH 2 DAYS Performed at [facility], [address], [place], [place] [address]    Report Status PENDING  Incomplete         Radiology Studies: No results found.      Scheduled Meds: . artificial tears   Both Eyes QHS  . chlorhexidine  15 mL Mouth/Throat BID  . heparin  5,000 Units Subcutaneous Q8H  . mometasone-formoterol  2 puff Inhalation BID  . polyvinyl alcohol  1 drop Both Eyes QID  . pregabalin  150 mg Oral BID  . tiotropium  18 mcg Inhalation Daily  . venlafaxine XR  150 mg Oral Q breakfast  . venlafaxine XR  75 mg Oral Q breakfast   Continuous Infusions: . cefTRIAXone (ROCEPHIN)  IV Stopped (09/18/16 1257)  . diphenhydrAMINE (BENADRYL) IVPB(SICKLE CELL ONLY)       LOS: 6 days        Daqwan Dougal Annett Gula, MD [facility] Pager [phone]  If 7PM-7AM, please contact night-coverage [email or web address] Password [email or web address] 09/19/2016, 8:51 AM

## 2016-09-19 NOTE — Progress Notes (Signed)
Patient ID: Deborah ChimesSusan G Young, female   DOB: [birth year], 71 y.o.   MRN: [id number] Left PCN intact, draining well; urine yellow;  pt afebrile; creat nl; further management per urology   Tawny AsalK Alydia Gosser,PAC [facility]

## 2016-09-19 NOTE — Progress Notes (Signed)
  General Surgery [facility]  Assessment & Plan: HD#7 - resolving small bowel obstruction             tolerated clear liquid diet - advance to full liquids this AM  Output from ostomy increased, loose             OOB to chair please             Per medical service, IR, urology        Velora Hecklerodd M. Sundance Moise, MD, [facility]       [facility]       Office: [phone]    Chief Complaint: Resolving SBO  Subjective: Patient in bed, comfortable.  Denies nausea or emesis.  Tolerated clear liquids.  Objective: Vital signs in last 24 hours: Temp:  [97.9 F (36.6 C)-98.7 F (37.1 C)] 98.7 F (37.1 C) (07/08 0529) Pulse Rate:  [66-68] 66 (07/08 0529) Resp:  [16-18] 18 (07/08 0529) BP: (148-152)/(65-74) 152/66 (07/08 0529) SpO2:  [96 %-98 %] 98 % (07/08 0529) Weight:  [83.6 kg (184 lb 4.9 oz)-83.9 kg (184 lb 15.5 oz)] 83.6 kg (184 lb 4.9 oz) (07/08 0754) Last BM Date: 09/19/16  Intake/Output from previous day: 07/07 0701 - 07/08 0700 In: 770 [P.O.:720; IV Piggyback:50] Out: 1500 [Urine:1375; Stool:125] Intake/Output this shift: Total I/O In: -  Out: 400 [Urine:400]  Physical Exam: HEENT - sclerae clear, mucous membranes moist Neck - soft Abdomen - soft without distension; hernia reducible; liquid stool in ostomy bag; no tenderness; active BS present Neuro - alert & oriented, no focal deficits  Lab Results:   Recent Labs  09/17/16 0501  WBC 9.6  HGB 13.6  HCT 42.5  PLT 238   BMET  Recent Labs  09/17/16 0501 09/18/16 0509  NA 139 144  K 4.2 4.2  CL 98* 100*  CO2 29 34*  GLUCOSE 118* 112*  BUN 8 9  CREATININE 0.53 0.61  CALCIUM 8.8* 9.2   PT/INR No results for input(s): LABPROT, INR in the last 72 hours. Comprehensive Metabolic Panel:    Component Value Date/Time   NA 144 09/18/2016 0509   NA 139 09/17/2016 0501   K 4.2 09/18/2016 0509   K 4.2 09/17/2016 0501   CL 100 (L) 09/18/2016 0509   CL 98 (L) 09/17/2016 0501   CO2 34 (H) 09/18/2016 0509   CO2 29 09/17/2016 0501   BUN 9 09/18/2016 0509   BUN 8 09/17/2016 0501   CREATININE 0.61 09/18/2016 0509   CREATININE 0.53 09/17/2016 0501   GLUCOSE 112 (H) 09/18/2016 0509   GLUCOSE 118 (H) 09/17/2016 0501   CALCIUM 9.2 09/18/2016 0509   CALCIUM 8.8 (L) 09/17/2016 0501   AST 44 (H) 09/13/2016 1326   AST 23 01/15/2010 0245   ALT 22 09/13/2016 1326   ALT 23 01/15/2010 0245   ALKPHOS 166 (H) 09/13/2016 1326   ALKPHOS 96 01/15/2010 0245   BILITOT 1.1 09/13/2016 1326   BILITOT 0.4 01/15/2010 0245   PROT 7.4 09/13/2016 1326   PROT 6.8 01/15/2010 0245   ALBUMIN 3.0 (L) 09/13/2016 1326   ALBUMIN 3.2 (L) 01/15/2010 0245    Studies/Results: No results found.    Braylen Denunzio M 09/19/2016  Patient ID: Deborah ChimesSusan G Mikolajczak, female   DOB: 1946/02/22, 71 y.o.   MRN: [id number]

## 2016-09-19 NOTE — Progress Notes (Signed)
Pt stated that she did not want to use CPAP tonight.  Machine remains at bedside if Pt changes her mind.  RT to monitor and assess as needed.

## 2016-09-20 LAB — BASIC METABOLIC PANEL
ANION GAP: 11 (ref 5–15)
BUN: 7 mg/dL (ref 6–20)
CHLORIDE: 99 mmol/L — AB (ref 101–111)
CO2: 32 mmol/L (ref 22–32)
Calcium: 9.2 mg/dL (ref 8.9–10.3)
Creatinine, Ser: 0.54 mg/dL (ref 0.44–1.00)
GFR calc Af Amer: >60 mL/min (ref >60)
GFR calc non Af Amer: >60 mL/min (ref >60)
GLUCOSE: 93 mg/dL (ref 65–99)
POTASSIUM: 3.3 mmol/L — AB (ref 3.5–5.1)
Sodium: 142 mmol/L (ref 135–145)

## 2016-09-20 MED ORDER — HYDROCODONE-ACETAMINOPHEN 7.5-325 MG PO TABS: 1.0000 | ORAL_TABLET | Freq: Two times a day (BID) | ORAL | 0 refills | Status: DC | PRN

## 2016-09-20 MED ORDER — HYDROCODONE-ACETAMINOPHEN 7.5-325 MG PO TABS: 1.0000 | ORAL_TABLET | Freq: Three times a day (TID) | ORAL | 0 refills | Status: DC

## 2016-09-20 MED ORDER — CEPHALEXIN 500 MG PO CAPS: 500.0000 mg | ORAL_CAPSULE | Freq: Four times a day (QID) | ORAL | 0 refills | Status: AC

## 2016-09-20 MED ORDER — CEPHALEXIN 500 MG PO CAPS: 500.0000 mg | ORAL_CAPSULE | Freq: Four times a day (QID) | ORAL | Status: DC

## 2016-09-20 MED ORDER — POTASSIUM CHLORIDE 20 MEQ/15ML (10%) PO SOLN
40.0000 meq | Freq: Once | ORAL | Status: DC
  Filled 2016-09-20: qty 30

## 2016-09-20 NOTE — Final Consult Note (Signed)
Consultant Final Sign-Off Note    Assessment/Final recommendations  Deborah Young is a 71 y.o. female followed by [facility] for SBO   Wound care (if applicable):    Diet at discharge: per primary team   Activity at discharge: per primary team   Follow-up appointment:  PRN   Pending results:  Unresulted Labs    None       Medication recommendations:   Other recommendations:    Thank you for allowing [place] to participate in the care of your patient!  Please consult [place] again if you have further needs for your patient.  Rivers Gassmann C. 09/20/2016 8:52 AM    Subjective   Feels better.  Having bowel function  Objective  Vital signs in last 24 hours: Temp:  [97.9 F (36.6 C)-98.7 F (37.1 C)] 97.9 F (36.6 C) (07/09 0438) Pulse Rate:  [66-70] 68 (07/09 0438) Resp:  [18-19] 19 (07/09 0438) BP: (134-164)/(57-80) 134/57 (07/09 0438) SpO2:  [96 %-99 %] 97 % (07/09 0438) FiO2 (%):  [96 %] 96 % (07/09 0815)  General: NAD abd soft    Pertinent labs and Studies: No results for input(s): WBC, HGB, HCT in the last 72 hours. BMET  Recent Labs  09/18/16 0509 09/20/16 0500  NA 144 142  K 4.2 3.3*  CL 100* 99*  CO2 34* 32  GLUCOSE 112* 93  BUN 9 7  CREATININE 0.61 0.54  CALCIUM 9.2 9.2   No results for input(s): LABURIN in the last 72 hours. Results for orders placed or performed during the hospital encounter of 09/13/16  MRSA PCR Screening     Status: None   Collection Time: 09/14/16  6:32 AM  Result Value Ref Range Status   MRSA by PCR NEGATIVE NEGATIVE Final    Comment:        The GeneXpert MRSA Assay (FDA approved for NASAL specimens only), is one component of a comprehensive MRSA colonization surveillance program. It is not intended to diagnose MRSA infection nor to guide or monitor treatment for MRSA infections.   Culture, blood (routine x 2)     Status: Abnormal   Collection Time: 09/14/16  6:53 PM  Result Value Ref Range Status   Specimen  Description BLOOD RIGHT ARM  Final   Special Requests   Final    BOTTLES DRAWN AEROBIC AND ANAEROBIC Blood Culture adequate volume   Culture  Setup Time   Final    GRAM NEGATIVE RODS AEROBIC BOTTLE ONLY CRITICAL VALUE NOTED.  VALUE IS CONSISTENT WITH PREVIOUSLY REPORTED AND CALLED VALUE.    Culture (A)  Final    ESCHERICHIA COLI SUSCEPTIBILITIES PERFORMED ON PREVIOUS CULTURE WITHIN THE LAST 5 DAYS. Performed at [facility], [address], [place], [place] [address]    Report Status 09/17/2016 FINAL  Final  Culture, blood (routine x 2)     Status: Abnormal   Collection Time: 09/14/16  6:53 PM  Result Value Ref Range Status   Specimen Description BLOOD RIGHT ARM  Final   Special Requests IN PEDIATRIC BOTTLE Blood Culture adequate volume  Final   Culture  Setup Time   Final    GRAM NEGATIVE RODS AEROBIC BOTTLE ONLY CRITICAL RESULT CALLED TO, READ BACK BY AND VERIFIED WITH: Azzie Glatter Pharm.D. 10:05 09/15/16 ([email or web address]) Performed at [facility], [address], [place], [place] [address]    Culture ESCHERICHIA COLI (A)  Final   Report Status 09/17/2016 FINAL  Final   Organism ID, Bacteria  ESCHERICHIA COLI  Final      Susceptibility   Escherichia coli - MIC*    AMPICILLIN >=32 RESISTANT Resistant     CEFAZOLIN <=4 SENSITIVE Sensitive     CEFEPIME <=1 SENSITIVE Sensitive     CEFTAZIDIME <=1 SENSITIVE Sensitive     CEFTRIAXONE <=1 SENSITIVE Sensitive     CIPROFLOXACIN >=4 RESISTANT Resistant     GENTAMICIN <=1 SENSITIVE Sensitive     IMIPENEM <=0.25 SENSITIVE Sensitive     TRIMETH/SULFA >=320 RESISTANT Resistant     AMPICILLIN/SULBACTAM 16 INTERMEDIATE Intermediate     PIP/TAZO <=4 SENSITIVE Sensitive     Extended ESBL NEGATIVE Sensitive     * ESCHERICHIA COLI  Blood Culture ID Panel (Reflexed)     Status: Abnormal   Collection Time: 09/14/16  6:53 PM  Result Value Ref Range Status   Enterococcus species NOT DETECTED NOT DETECTED Final   Listeria monocytogenes NOT  DETECTED NOT DETECTED Final   Staphylococcus species NOT DETECTED NOT DETECTED Final   Staphylococcus aureus NOT DETECTED NOT DETECTED Final   Streptococcus species NOT DETECTED NOT DETECTED Final   Streptococcus agalactiae NOT DETECTED NOT DETECTED Final   Streptococcus pneumoniae NOT DETECTED NOT DETECTED Final   Streptococcus pyogenes NOT DETECTED NOT DETECTED Final   Acinetobacter baumannii NOT DETECTED NOT DETECTED Final   Enterobacteriaceae species DETECTED (A) NOT DETECTED Final    Comment: Enterobacteriaceae represent a large family of gram-negative bacteria, not a single organism. CRITICAL RESULT CALLED TO, READ BACK BY AND VERIFIED WITH: Azzie GlatterJ. Legge Pharm.D. 10:05 09/15/16 ([email or web address])    Enterobacter cloacae complex NOT DETECTED NOT DETECTED Final   Escherichia coli DETECTED (A) NOT DETECTED Final    Comment: CRITICAL RESULT CALLED TO, READ BACK BY AND VERIFIED WITH: Mardee PostinJ. Legge Pharm.D. 10:05 09/15/16 ([email or web address])    Klebsiella oxytoca NOT DETECTED NOT DETECTED Final   Klebsiella pneumoniae NOT DETECTED NOT DETECTED Final   Proteus species NOT DETECTED NOT DETECTED Final   Serratia marcescens NOT DETECTED NOT DETECTED Final   Carbapenem resistance NOT DETECTED NOT DETECTED Final   Haemophilus influenzae NOT DETECTED NOT DETECTED Final   Neisseria meningitidis NOT DETECTED NOT DETECTED Final   Pseudomonas aeruginosa NOT DETECTED NOT DETECTED Final   Candida albicans NOT DETECTED NOT DETECTED Final   Candida glabrata NOT DETECTED NOT DETECTED Final   Candida krusei NOT DETECTED NOT DETECTED Final   Candida parapsilosis NOT DETECTED NOT DETECTED Final   Candida tropicalis NOT DETECTED NOT DETECTED Final    Comment: Performed at [facility], [address] [place], [place] [address]  Culture, Urine     Status: Abnormal   Collection Time: 09/14/16  7:23 PM  Result Value Ref Range Status   Specimen Description URINE, RANDOM  Final   Special Requests meropenem  Final   Culture  >=100,000 COLONIES/mL ESCHERICHIA COLI (A)  Final   Report Status 09/17/2016 FINAL  Final   Organism ID, Bacteria ESCHERICHIA COLI (A)  Final      Susceptibility   Escherichia coli - MIC*    AMPICILLIN >=32 RESISTANT Resistant     CEFAZOLIN <=4 SENSITIVE Sensitive     CEFTRIAXONE <=1 SENSITIVE Sensitive     CIPROFLOXACIN >=4 RESISTANT Resistant     GENTAMICIN <=1 SENSITIVE Sensitive     IMIPENEM <=0.25 SENSITIVE Sensitive     NITROFURANTOIN <=16 SENSITIVE Sensitive     TRIMETH/SULFA >=320 RESISTANT Resistant     AMPICILLIN/SULBACTAM 16 INTERMEDIATE Intermediate     PIP/TAZO <=4 SENSITIVE Sensitive  Extended ESBL NEGATIVE Sensitive     * >=100,000 COLONIES/mL ESCHERICHIA COLI  Culture, blood (Routine X 2) w Reflex to ID Panel     Status: None (Preliminary result)   Collection Time: 09/16/16 11:45 AM  Result Value Ref Range Status   Specimen Description BLOOD BLOOD RIGHT ARM  Final   Special Requests   Final    BOTTLES DRAWN AEROBIC AND ANAEROBIC Blood Culture adequate volume   Culture   Final    NO GROWTH 3 DAYS Performed at [facility], [address], [place], [place] [address]    Report Status PENDING  Incomplete  Culture, blood (Routine X 2) w Reflex to ID Panel     Status: None (Preliminary result)   Collection Time: 09/16/16 11:54 AM  Result Value Ref Range Status   Specimen Description BLOOD BLOOD LEFT HAND  Final   Special Requests IN PEDIATRIC BOTTLE Blood Culture adequate volume  Final   Culture   Final    NO GROWTH 3 DAYS Performed at [facility], [address], [place], [place] [address]    Report Status PENDING  Incomplete    Imaging: No results found.

## 2016-09-20 NOTE — Progress Notes (Signed)
Pt is ready to return to [facility] [place] today. Pt / sister are in agreement with this plan. [facility] transport is required. Medical necessity form completed. Pt / sister are aware out of pocket costs may be associated with this transport. D/C Summary sent to [facility] for review. Scripts included in d/c packet. # for report provided to nsg.  Cori RazorJamie Quinnlyn Hearns LCSW [phone]

## 2016-09-20 NOTE — Care Management Important Message (Signed)
Important Message  Patient Details  Name: Deborah ChimesSusan G Young MRN: [id number] Date of Birth: 11/24/1945   Medicare Important Message Given:  Yes    Caren MacadamFuller, Cathy Ropp 09/20/2016, 11:04 AMImportant Message  Patient Details  Name: Deborah ChimesSusan G Young MRN: [id number] Date of Birth: 11/24/1945   Medicare Important Message Given:  Yes    Caren MacadamFuller, Idaly Verret 09/20/2016, 11:04 AM

## 2016-09-20 NOTE — Discharge Summary (Signed)
Physician Discharge Summary  TRINETTE VERA ZOX:[id number] DOB: 1946-02-08 DOA: 09/13/2016  PCP: Nadara Eaton, MD  Admit date: 09/13/2016 Discharge date: 09/20/2016  Admitted From: SNF Disposition:  SNF  Recommendations for Outpatient Follow-up:  1. Follow up with PCP in 1-weeks 2. Patient placed on cephalexin for the next 4 days.   Home Health: No  Equipment/Devices: NA   Discharge Condition: Stable  CODE STATUS: DNR Diet recommendation: Heart Healthy   Brief/Interim Summary: 71 year old female who presented with a chief complaint of constipation. She does have history of spina bifida, status post cystectomy and conduit diversion, left BKA, right foot multiple toe amputations, diverging colostomy and urostomy, COPD, peripheral neuropathy, history of small bowel structures in the past requiring surgical intervention. She presented that gradually progressive abdominal pain for last 5-6 days prior to hospitalization, associated with nausea and lack of stool at her colostomy bag. On initial physical examination blood pressure 129/66, heart rate 77, temperature 98.5, respiratory rate 18, oxygen saturation 94%, her oral mucosa was moist, her neck was supple,her lungs were clear to auscultation bilaterally, heart S1-S2 present and rhythmic, her abdomen had hypoactive bowel sounds, positive distention,her colostomy bag was empty with no rebound or peritoneal sounds, no lower extremity edema. Left BKA with right foot multiple toe amputations. Sodium 139, potassium 4.4, chloride 104, bicarbonate 24, glucose 90, BUN 20, creatinine 0.81, white count 9.2, hemoglobin 12.7, hematocrit 40.5, platelets 244, urinalysis positive for infection. CT of the abdomen was a times a 9 mm left UPJ stone causing moderate left-sided hydronephrosis. Atrophic native right kidney with nonobstructing renal calculus. Early to high-grade partial small bowel obstruction secondary right lower quadrant transition point possibly  from adhesion or short segmental stricture involving distal small intestine. Chest x-ray with a right patchy basilar infiltrate  Patient was admitted to the medical floor with a working diagnosis of small bowel obstruction, incidental left UPJ stone with hydronephrosis.   1. Partial small bowel obstruction. Patient was admitted to the medical ward, she received IV fluids, and an NG tube was placed to low intermittent suction with improvement of her symptoms. Patient tolerated well medical therapy, NG tube was removed, her diet was advanced with good toleration. Surgery was consulted, recommendations for conservative medical management.  2. Left UPJ stone with hydronephrosis, complicated with urinary tract infection, gram-negative sepsis, Escherichia coli, present on admission. Patient was placed on ceftriaxone intravenously with good response, patient was intervened by urology, nephrostomy tube was placed in the left good urine output. Urine culture and blood culture were positive for Escherichia coli which was sensitive to cephalosporins, resistant to ampicillin and fluoroquinolones. Follow-up blood cultures were no growth. Patient was transitioned to oral cephalexin that she will finish as an outpatient. Patient will follow-up as an outpatient with urology.  3. COPD. Stable with no exacerbation, patient was continued on bronchodilator therapy.   4. Spina bifida status post cystectomy, morbid obesity. DVT prophylaxis, follow-up as an outpatient at the skilled nursing facility.  5. Depression. Continue venlafaxine. No agitation or confusion.   Discharge Diagnoses:  Principal Problem:   SBO (small bowel obstruction) (HCC) Active Problems:   Essential hypertension, benign   CAD (coronary artery disease)   GERD    Discharge Instructions   Allergies as of 09/20/2016      Reactions   Codeine    Erythromycin Other (See Comments)   Per MAR   Penicillins Other (See Comments)   Per MAR Has  patient had a PCN reaction causing immediate rash, facial/tongue/throat  swelling, SOB or lightheadedness with hypotension: Unknown Has patient had a PCN reaction causing severe rash involving mucus membranes or skin necrosis: Unknown Has patient had a PCN reaction that required hospitalization: Unknown Has patient had a PCN reaction occurring within the last 10 years: Unknown If all of the above answers are "NO", then may proceed with Cephalosporin use.      Medication List    TAKE these medications   aspirin EC 81 MG tablet Take 81 mg by mouth daily.   budesonide-formoterol 80-4.5 MCG/ACT inhaler Commonly known as:  SYMBICORT Inhale 2 puffs into the lungs 2 (two) times daily.   carboxymethylcellulose 0.5 % Soln Commonly known as:  REFRESH PLUS 1 drop 4 (four) times daily.   cephALEXin 500 MG capsule Commonly known as:  KEFLEX Take 1 capsule (500 mg total) by mouth every 6 (six) hours. Start taking on:  09/21/2016   chlorhexidine 0.12 % solution Commonly known as:  PERIDEX Use as directed 15 mLs in the mouth or throat 2 (two) times daily.   HYDROcodone-acetaminophen 7.5-325 MG tablet Commonly known as:  NORCO Take 1 tablet by mouth 3 (three) times daily.   HYDROcodone-acetaminophen 7.5-325 MG tablet Commonly known as:  NORCO Take 1 tablet by mouth 2 (two) times daily as needed for moderate pain.   lamoTRIgine 100 MG tablet Commonly known as:  LAMICTAL Take 100 mg by mouth daily.   pregabalin 100 MG capsule Commonly known as:  LYRICA Take 100 mg by mouth 2 (two) times daily.   pregabalin 150 MG capsule Commonly known as:  LYRICA Take 150 mg by mouth 2 (two) times daily.   primidone 50 MG tablet Commonly known as:  MYSOLINE Take 50 mg by mouth 2 (two) times daily.   SYSTANE OVERNIGHT THERAPY 0.3 % Gel ophthalmic ointment Generic drug:  hypromellose Place 1 application into both eyes at bedtime.   tiotropium 18 MCG inhalation capsule Commonly known as:   SPIRIVA Place 18 mcg into inhaler and inhale daily.   venlafaxine XR 75 MG 24 hr capsule Commonly known as:  EFFEXOR-XR Take 75 mg by mouth daily with breakfast.   venlafaxine XR 150 MG 24 hr capsule Commonly known as:  EFFEXOR-XR Take 150 mg by mouth daily with breakfast.   Vitamin D (Ergocalciferol) 50000 units Caps capsule Commonly known as:  DRISDOL Take 50,000 Units by mouth every 7 (seven) days.      Contact information for after-discharge care    Destination    [facility] .   Specialty:  Skilled Nursing Facility Contact information: [address] [place] [address] [phone]             Allergies  Allergen Reactions  . Codeine   . Erythromycin Other (See Comments)    Per MAR  . Penicillins Other (See Comments)    Per MAR  Has patient had a PCN reaction causing immediate rash, facial/tongue/throat swelling, SOB or lightheadedness with hypotension: Unknown Has patient had a PCN reaction causing severe rash involving mucus membranes or skin necrosis: Unknown Has patient had a PCN reaction that required hospitalization: Unknown Has patient had a PCN reaction occurring within the last 10 years: Unknown If all of the above answers are "NO", then may proceed with Cephalosporin use.    Consultations:  Urology  Interventional Radiology  Surgery   Procedures/Studies: Ct Abdomen Pelvis W Contrast  Result Date: 09/13/2016 CLINICAL DATA:  Abdominal pain in various locations x5 days with nausea and constipation.  Last bowel movement 6 days ago. No emesis. Soft nontender focus lateral to urostomy bag in the right lower quadrant. Small nontender mass right periumbilical region as well. EXAM: CT ABDOMEN AND PELVIS WITH CONTRAST TECHNIQUE: Multidetector CT imaging of the abdomen and pelvis was performed using the standard protocol following bolus administration of intravenous contrast. CONTRAST:  ISOVUE-300 IOPAMIDOL  (ISOVUE-300) INJECTION 61% COMPARISON:  02/26/2009 FINDINGS: Lower chest: Bibasilar subsegmental atelectasis and/or scarring. Included heart appears normal in size without pericardial effusion. Hepatobiliary: Hepatic steatosis. Stable hypodensity along the falciform ligament may reflect fatty infiltration. Status post cholecystectomy. No biliary dilatation. Pancreas: Unremarkable. No pancreatic ductal dilatation or surrounding inflammatory changes. Spleen: Normal in size without focal abnormality. Adrenals/Urinary Tract: There is an 8 x 8 x 9 mm left UPJ stone causing moderate left-sided hydronephrosis and perinephric fat stranding. Atrophic right kidney with 8 mm nonobstructing calculus. Diverting right lower quadrant urostomy with parastomal hernia containing a portion adjacent ascending colon as well as omental fat. Stomach/Bowel: Physiologically distended stomach. Normal small bowel rotation. Left lateral hernia is again noted containing fluid-filled distended jejunal loops measuring up to 4.5 cm in caliber as well as portions of nondistended descending colon containing stool. There appears to be a right lower quadrant transition point, series 2, image 69 contributing to a partial high-grade or early small bowel obstruction. Desiccated stool noted in the rectosigmoid. Vascular/Lymphatic: Aortic atherosclerosis without aneurysm or dissection. No lymphadenopathy by CT size criteria. Reproductive: Uterus and bilateral adnexa are unremarkable. Other: There is a 5 x 5 x 2.2 cm fat containing right periumbilical ventral hernia. Musculoskeletal: Dextroconvex curvature of the lower thoracic spine. Lower lumbar facet arthropathy. No acute osseous appearing abnormality. 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 IMPRESSION: 1. 8 x 8 x 9 mm left UPJ stone causing moderate left-sided hydronephrosis. Atrophic native right kidney with nonobstructing renal calculus. 2. Early or  high grade partial SBO secondary to right lower quadrant transition point possibly from an adhesion or short segmental stricture involving distal small intestine, series 2, image 69. 3. Right lower quadrant urostomy with new parastomal fat and large bowel containing hernia without evidence of incarceration. This may account for the reported soft tissue distention lateral to the urostomy. 4. Small fat containing right periumbilical ventral hernia. 5. Known left lateral small and large bowel containing hernia. 6. 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 7. Hepatic steatosis. 8. Cholecystectomy. Electronically Signed   By: Tollie Eth M.D.   On: 09/13/2016 15:17   Dg Chest Port 1 View  Result Date: 09/14/2016 CLINICAL DATA:  Fever post nephrostomy tube. EXAM: PORTABLE CHEST 1 VIEW COMPARISON:  09/13/2016 FINDINGS: Bibasilar atelectasis. Heart is borderline in size. No effusions. NG tube is seen entering the stomach. IMPRESSION: Bibasilar atelectasis. Electronically Signed   By: Charlett Nose M.D.   On: 09/14/2016 19:30   Dg Chest Port 1 View  Result Date: 09/13/2016 CLINICAL DATA:  Shortness of breath. EXAM: PORTABLE CHEST 1 VIEW COMPARISON:  01/26/2016 and prior exam FINDINGS: Upper limits normal heart size again noted. Patchy right lower lung opacities are noted and may represent airspace disease/ pneumonia. Mild left basilar scarring/ atelectasis is unchanged. There is no evidence of pleural effusion or pneumothorax. A severe thoracic scoliosis is again noted. IMPRESSION: Patchy right lower lung opacities -question pneumonia. No other acute abnormality. Electronically Signed   By: Harmon Pier M.D.   On: 09/13/2016 16:50  Dg Abd Portable 1v  Result Date: 09/15/2016 CLINICAL DATA:  Small bowel obstruction EXAM: PORTABLE ABDOMEN - 1 VIEW COMPARISON:  09/14/2016 FINDINGS: NG tube remains in the stomach. Oral contrast material noted throughout the  colon. No dilated small bowel loops. Herniated colon in the right lower quadrant again noted as seen on prior CT. No free air organomegaly. Prior cholecystectomy. IMPRESSION: Contrast material noted throughout the colon, similar prior study. No current evidence of bowel obstruction. Electronically Signed   By: Charlett Nose M.D.   On: 09/15/2016 07:12   Dg Abd Portable 1v  Result Date: 09/14/2016 CLINICAL DATA:  71 year old female 24 hours status post enteric contrast administration via NG tube for small bowel obstruction. Status post surgical nephrostomy tube placement today. EXAM: PORTABLE ABDOMEN - 1 VIEW COMPARISON:  0237 hours today, and earlier. FINDINGS: Portable AP view at 1900 hours. Stable enteric tube. Left side nephrostomy tube now in place. Large left lateral abdominal hernia as seen on the CT Abdomen and Pelvis yesterday. Oral contrast is present in the large bowel from the cecum to the descending colon level. Stool ball re - demonstrated in the rectum. No dilated small bowel loops are evident. Stable visualized osseous structures. IMPRESSION: 1. Oral contrast has definitely reached the colon. Enteric contrast throughout large bowel from the cecum to the descending colon. 2. No dilated small bowel loops are evident. 3. Stable enteric tube.  Left nephrostomy tube now in place. Electronically Signed   By: Odessa Fleming M.D.   On: 09/14/2016 19:36   Dg Abd Portable 1v-small Bowel Obstruction Protocol-initial, 8 Hr Delay  Result Date: 09/14/2016 CLINICAL DATA:  Small bowel obstruction.  8 hour delayed film. EXAM: PORTABLE ABDOMEN - 1 VIEW COMPARISON:  Radiographs and CT yesterday FINDINGS: Enteric tube in place. Administered enteric contrast within bowel loops in both the right and left abdomen. There may be contrast in the colon in the right abdomen versus dilated small bowel. Air-filled dilated small bowel in the central abdomen again seen. No free air. IMPRESSION: Enteric contrast possibly but not  definitively seen within the right colon. Recommend 24 exam for confirmation. Dilated small bowel in the central abdomen again seen. Electronically Signed   By: Rubye Oaks M.D.   On: 09/14/2016 03:11   Dg Abd Portable 1v-small Bowel Protocol-position Verification  Result Date: 09/13/2016 CLINICAL DATA:  NG tube placement EXAM: PORTABLE ABDOMEN - 1 VIEW COMPARISON:  None. FINDINGS: 1810 hours. NG tube tip is is positioned in the mid stomach. Contrast material in the left kidney and intrarenal collecting system is compatible with the CT scan earlier today. Mild fullness of the left intrarenal collecting system evident. Visualized bowel loops are diffusely gas distended. IMPRESSION: NG tube tip is in the mid stomach. Electronically Signed   By: Kennith Center M.D.   On: 09/13/2016 18:28   Ir Nephrostomy Placement Left  Result Date: 09/14/2016 INDICATION: 71 year old with an atrophic right kidney and obstructing left UPJ stone. Patient also has a history of cystectomy with an ileal conduit. Patient needs decompression of the left renal collecting system. EXAM: PLACEMENT OF LEFT PERCUTANEOUS NEPHROSTOMY TUBE WITH ULTRASOUND AND FLUOROSCOPIC GUIDANCE COMPARISON:  None. MEDICATIONS: Ciprofloxacin 400 mg IV; The antibiotic was administered in an appropriate time frame prior to skin puncture. ANESTHESIA/SEDATION: Fentanyl 1.0 mcg IV; Versed 25 mg IV Moderate Sedation Time:  13 minutes The patient was continuously monitored during the procedure by the interventional radiology nurse under my direct supervision. CONTRAST:  7 mL Isovue 300 -  administered into the collecting system(s) FLUOROSCOPY TIME:  Fluoroscopy Time: 42 seconds, 11 mGy). COMPLICATIONS: None immediate. PROCEDURE: Informed written consent was obtained from the patient after a thorough discussion of the procedural risks, benefits and alternatives. All questions were addressed. Maximal Sterile Barrier Technique was utilized including caps, mask,  sterile gowns, sterile gloves, sterile drape, hand hygiene and skin antiseptic. A timeout was performed prior to the initiation of the procedure. Patient was placed prone. Ultrasound was used to identify the left kidney. Left flank was prepped and draped in a sterile fashion. Skin was anesthetized with 1% lidocaine. Using ultrasound guidance, 21 gauge needle was directed into a mid/ lower pole calyx. Wire was easily advanced into the renal collecting system. An Accustick dilator set was placed. A J wire was placed and a 10 [place]French multipurpose drain was reconstituted in the renal pelvis. Small contrast injection confirmed placement in the renal collecting system. Catheter was sutured to skin and attached to gravity bag. Fluoroscopic and ultrasound images were taken and saved for documentation. FINDINGS: Moderate left hydronephrosis. Nephrostomy tube coiled in the renal pelvis. IMPRESSION: Successful placement of a left percutaneous nephrostomy tube with ultrasound and fluoroscopic guidance. Electronically Signed   By: Richarda OverlieAdam  Henn M.D.   On: 09/14/2016 11:10      Subjective: Patient feeling well, tolerating po diet, no significant nausea, no vomiting, no abdominal pain and no dyspnea.   Discharge Exam: Vitals:   09/20/16 0438 09/20/16 1350  BP: (!) 134/57 (!) 126/57  Pulse: 68 83  Resp: 19 18  Temp: 97.9 F (36.6 C) 98.6 F (37 C)   Vitals:   09/19/16 1948 09/19/16 2123 09/20/16 0438 09/20/16 1350  BP:  (!) 164/80 (!) 134/57 (!) 126/57  Pulse:  70 68 83  Resp:  18 19 18   Temp:  98.7 F (37.1 C) 97.9 F (36.6 C) 98.6 F (37 C)  TempSrc:  Oral Oral Oral  SpO2: 96% 97% 97% 97%  Weight:      Height:        General: Pt is alert, awake, not in acute distress Cardiovascular: RRR, S1/S2 +, no rubs, no gallops Respiratory: CTA bilaterally, no wheezing, no rhonchi Abdominal: Soft, NT, ND, bowel sounds +. Urostomy and colostomy bag in place with positive output.  Extremities: trace edema, no  cyanosis    The results of significant diagnostics from this hospitalization (including imaging, microbiology, ancillary and laboratory) are listed below for reference.     Microbiology: Recent Results (from the past 240 hour(s))  MRSA PCR Screening     Status: None   Collection Time: 09/14/16  6:32 AM  Result Value Ref Range Status   MRSA by PCR NEGATIVE NEGATIVE Final    Comment:        The GeneXpert MRSA Assay (FDA approved for NASAL specimens only), is one component of a comprehensive MRSA colonization surveillance program. It is not intended to diagnose MRSA infection nor to guide or monitor treatment for MRSA infections.   Culture, blood (routine x 2)     Status: Abnormal   Collection Time: 09/14/16  6:53 PM  Result Value Ref Range Status   Specimen Description BLOOD RIGHT ARM  Final   Special Requests   Final    BOTTLES DRAWN AEROBIC AND ANAEROBIC Blood Culture adequate volume   Culture  Setup Time   Final    GRAM NEGATIVE RODS AEROBIC BOTTLE ONLY CRITICAL VALUE NOTED.  VALUE IS CONSISTENT WITH PREVIOUSLY REPORTED AND CALLED VALUE.  Culture (A)  Final    ESCHERICHIA COLI SUSCEPTIBILITIES PERFORMED ON PREVIOUS CULTURE WITHIN THE LAST 5 DAYS. Performed at [facility], [address] [place], [place] [address]    Report Status 09/17/2016 FINAL  Final  Culture, blood (routine x 2)     Status: Abnormal   Collection Time: 09/14/16  6:53 PM  Result Value Ref Range Status   Specimen Description BLOOD RIGHT ARM  Final   Special Requests IN PEDIATRIC BOTTLE Blood Culture adequate volume  Final   Culture  Setup Time   Final    GRAM NEGATIVE RODS AEROBIC BOTTLE ONLY CRITICAL RESULT CALLED TO, READ BACK BY AND VERIFIED WITH: Azzie Glatter Pharm.D. 10:05 09/15/16 ([email or web address]) Performed at [facility], [address] [place], [place] [address]    Culture ESCHERICHIA COLI (A)  Final   Report Status 09/17/2016 FINAL  Final   Organism ID, Bacteria ESCHERICHIA COLI   Final      Susceptibility   Escherichia coli - MIC*    AMPICILLIN >=32 RESISTANT Resistant     CEFAZOLIN <=4 SENSITIVE Sensitive     CEFEPIME <=1 SENSITIVE Sensitive     CEFTAZIDIME <=1 SENSITIVE Sensitive     CEFTRIAXONE <=1 SENSITIVE Sensitive     CIPROFLOXACIN >=4 RESISTANT Resistant     GENTAMICIN <=1 SENSITIVE Sensitive     IMIPENEM <=0.25 SENSITIVE Sensitive     TRIMETH/SULFA >=320 RESISTANT Resistant     AMPICILLIN/SULBACTAM 16 INTERMEDIATE Intermediate     PIP/TAZO <=4 SENSITIVE Sensitive     Extended ESBL NEGATIVE Sensitive     * ESCHERICHIA COLI  Blood Culture ID Panel (Reflexed)     Status: Abnormal   Collection Time: 09/14/16  6:53 PM  Result Value Ref Range Status   Enterococcus species NOT DETECTED NOT DETECTED Final   Listeria monocytogenes NOT DETECTED NOT DETECTED Final   Staphylococcus species NOT DETECTED NOT DETECTED Final   Staphylococcus aureus NOT DETECTED NOT DETECTED Final   Streptococcus species NOT DETECTED NOT DETECTED Final   Streptococcus agalactiae NOT DETECTED NOT DETECTED Final   Streptococcus pneumoniae NOT DETECTED NOT DETECTED Final   Streptococcus pyogenes NOT DETECTED NOT DETECTED Final   Acinetobacter baumannii NOT DETECTED NOT DETECTED Final   Enterobacteriaceae species DETECTED (A) NOT DETECTED Final    Comment: Enterobacteriaceae represent a large family of gram-negative bacteria, not a single organism. CRITICAL RESULT CALLED TO, READ BACK BY AND VERIFIED WITH: Azzie Glatter Pharm.D. 10:05 09/15/16 ([email or web address])    Enterobacter cloacae complex NOT DETECTED NOT DETECTED Final   Escherichia coli DETECTED (A) NOT DETECTED Final    Comment: CRITICAL RESULT CALLED TO, READ BACK BY AND VERIFIED WITH: Mardee Postin.D. 10:05 09/15/16 ([email or web address])    Klebsiella oxytoca NOT DETECTED NOT DETECTED Final   Klebsiella pneumoniae NOT DETECTED NOT DETECTED Final   Proteus species NOT DETECTED NOT DETECTED Final   Serratia marcescens NOT DETECTED NOT DETECTED Final    Carbapenem resistance NOT DETECTED NOT DETECTED Final   Haemophilus influenzae NOT DETECTED NOT DETECTED Final   Neisseria meningitidis NOT DETECTED NOT DETECTED Final   Pseudomonas aeruginosa NOT DETECTED NOT DETECTED Final   Candida albicans NOT DETECTED NOT DETECTED Final   Candida glabrata NOT DETECTED NOT DETECTED Final   Candida krusei NOT DETECTED NOT DETECTED Final   Candida parapsilosis NOT DETECTED NOT DETECTED Final   Candida tropicalis NOT DETECTED NOT DETECTED Final    Comment: Performed at [facility], [address], [place], [place]  [address]  Culture, Urine     Status: Abnormal   Collection Time: 09/14/16  7:23 PM  Result Value Ref Range Status   Specimen Description URINE, RANDOM  Final   Special Requests meropenem  Final   Culture >=100,000 COLONIES/mL ESCHERICHIA COLI (A)  Final   Report Status 09/17/2016 FINAL  Final   Organism ID, Bacteria ESCHERICHIA COLI (A)  Final      Susceptibility   Escherichia coli - MIC*    AMPICILLIN >=32 RESISTANT Resistant     CEFAZOLIN <=4 SENSITIVE Sensitive     CEFTRIAXONE <=1 SENSITIVE Sensitive     CIPROFLOXACIN >=4 RESISTANT Resistant     GENTAMICIN <=1 SENSITIVE Sensitive     IMIPENEM <=0.25 SENSITIVE Sensitive     NITROFURANTOIN <=16 SENSITIVE Sensitive     TRIMETH/SULFA >=320 RESISTANT Resistant     AMPICILLIN/SULBACTAM 16 INTERMEDIATE Intermediate     PIP/TAZO <=4 SENSITIVE Sensitive     Extended ESBL NEGATIVE Sensitive     * >=100,000 COLONIES/mL ESCHERICHIA COLI  Culture, blood (Routine X 2) w Reflex to ID Panel     Status: None (Preliminary result)   Collection Time: 09/16/16 11:45 AM  Result Value Ref Range Status   Specimen Description BLOOD BLOOD RIGHT ARM  Final   Special Requests   Final    BOTTLES DRAWN AEROBIC AND ANAEROBIC Blood Culture adequate volume   Culture   Final    NO GROWTH 4 DAYS Performed at [facility], [address], [place], [place] [address]    Report Status PENDING   Incomplete  Culture, blood (Routine X 2) w Reflex to ID Panel     Status: None (Preliminary result)   Collection Time: 09/16/16 11:54 AM  Result Value Ref Range Status   Specimen Description BLOOD BLOOD LEFT HAND  Final   Special Requests IN PEDIATRIC BOTTLE Blood Culture adequate volume  Final   Culture   Final    NO GROWTH 4 DAYS Performed at [facility], [address] [place], [place] [address]    Report Status PENDING  Incomplete     Labs: BNP (last 3 results) No results for input(s): BNP in the last 8760 hours. Basic Metabolic Panel:  Recent Labs Lab 09/14/16 0414 09/15/16 1238 09/16/16 0500 09/17/16 0501 09/18/16 0509 09/20/16 0500  NA 142 136 136 139 144 142  K 4.0 3.6 3.7 4.2 4.2 3.3*  CL 106 97* 95* 98* 100* 99*  CO2 24 30 32 29 34* 32  GLUCOSE 106* 138* 125* 118* 112* 93  BUN 18 9 9 8 9 7   CREATININE 0.75 0.61 0.54 0.53 0.61 0.54  CALCIUM 8.6* 8.6* 8.8* 8.8* 9.2 9.2  MG 1.9  --   --   --   --   --    Liver Function Tests: No results for input(s): AST, ALT, ALKPHOS, BILITOT, PROT, ALBUMIN in the last 168 hours. No results for input(s): LIPASE, AMYLASE in the last 168 hours. No results for input(s): AMMONIA in the last 168 hours. CBC:  Recent Labs Lab 09/14/16 0414 09/16/16 0500 09/17/16 0501  WBC 8.3 11.4* 9.6  NEUTROABS  --  8.4* 6.8  HGB 12.1 12.3 13.6  HCT 38.7 38.1 42.5  MCV 92.6 90.9 91.2  PLT 248 242 238   Cardiac Enzymes: No results for input(s): CKTOTAL, CKMB, CKMBINDEX, TROPONINI in the last 168 hours. BNP: Invalid input(s): POCBNP CBG: No results for input(s): GLUCAP in the last 168 hours. D-Dimer No results for  input(s): DDIMER in the last 72 hours. Hgb A1c No results for input(s): HGBA1C in the last 72 hours. Lipid Profile No results for input(s): CHOL, HDL, LDLCALC, TRIG, CHOLHDL, LDLDIRECT in the last 72 hours. Thyroid function studies No results for input(s): TSH, T4TOTAL, T3FREE, THYROIDAB in the last 72  hours.  Invalid input(s): FREET3 Anemia work up No results for input(s): VITAMINB12, FOLATE, FERRITIN, TIBC, IRON, RETICCTPCT in the last 72 hours. Urinalysis    Component Value Date/Time   COLORURINE YELLOW 09/14/2016 0220   APPEARANCEUR HAZY (A) 09/14/2016 0220   LABSPEC 1.015 09/14/2016 0220   PHURINE 6.0 09/14/2016 0220   GLUCOSEU NEGATIVE 09/14/2016 0220   HGBUR SMALL (A) 09/14/2016 0220   BILIRUBINUR NEGATIVE 09/14/2016 0220   KETONESUR >80 (A) 09/14/2016 0220   PROTEINUR TRACE (A) 09/14/2016 0220   UROBILINOGEN 0.2 02/27/2008 1331   NITRITE POSITIVE (A) 09/14/2016 0220   LEUKOCYTESUR MODERATE (A) 09/14/2016 0220   Sepsis Labs Invalid input(s): PROCALCITONIN,  WBC,  LACTICIDVEN Microbiology Recent Results (from the past 240 hour(s))  MRSA PCR Screening     Status: None   Collection Time: 09/14/16  6:32 AM  Result Value Ref Range Status   MRSA by PCR NEGATIVE NEGATIVE Final    Comment:        The GeneXpert MRSA Assay (FDA approved for NASAL specimens only), is one component of a comprehensive MRSA colonization surveillance program. It is not intended to diagnose MRSA infection nor to guide or monitor treatment for MRSA infections.   Culture, blood (routine x 2)     Status: Abnormal   Collection Time: 09/14/16  6:53 PM  Result Value Ref Range Status   Specimen Description BLOOD RIGHT ARM  Final   Special Requests   Final    BOTTLES DRAWN AEROBIC AND ANAEROBIC Blood Culture adequate volume   Culture  Setup Time   Final    GRAM NEGATIVE RODS AEROBIC BOTTLE ONLY CRITICAL VALUE NOTED.  VALUE IS CONSISTENT WITH PREVIOUSLY REPORTED AND CALLED VALUE.    Culture (A)  Final    ESCHERICHIA COLI SUSCEPTIBILITIES PERFORMED ON PREVIOUS CULTURE WITHIN THE LAST 5 DAYS. Performed at [facility], [address], [place], [place] [address]    Report Status 09/17/2016 FINAL  Final  Culture, blood (routine x 2)     Status: Abnormal   Collection Time: 09/14/16  6:53 PM   Result Value Ref Range Status   Specimen Description BLOOD RIGHT ARM  Final   Special Requests IN PEDIATRIC BOTTLE Blood Culture adequate volume  Final   Culture  Setup Time   Final    GRAM NEGATIVE RODS AEROBIC BOTTLE ONLY CRITICAL RESULT CALLED TO, READ BACK BY AND VERIFIED WITH: Azzie Glatter Pharm.D. 10:05 09/15/16 ([email or web address]) Performed at [facility], [address], [place], [place] [address]    Culture ESCHERICHIA COLI (A)  Final   Report Status 09/17/2016 FINAL  Final   Organism ID, Bacteria ESCHERICHIA COLI  Final      Susceptibility   Escherichia coli - MIC*    AMPICILLIN >=32 RESISTANT Resistant     CEFAZOLIN <=4 SENSITIVE Sensitive     CEFEPIME <=1 SENSITIVE Sensitive     CEFTAZIDIME <=1 SENSITIVE Sensitive     CEFTRIAXONE <=1 SENSITIVE Sensitive     CIPROFLOXACIN >=4 RESISTANT Resistant     GENTAMICIN <=1 SENSITIVE Sensitive     IMIPENEM <=0.25 SENSITIVE Sensitive     TRIMETH/SULFA >=320 RESISTANT Resistant     AMPICILLIN/SULBACTAM 16  INTERMEDIATE Intermediate     PIP/TAZO <=4 SENSITIVE Sensitive     Extended ESBL NEGATIVE Sensitive     * ESCHERICHIA COLI  Blood Culture ID Panel (Reflexed)     Status: Abnormal   Collection Time: 09/14/16  6:53 PM  Result Value Ref Range Status   Enterococcus species NOT DETECTED NOT DETECTED Final   Listeria monocytogenes NOT DETECTED NOT DETECTED Final   Staphylococcus species NOT DETECTED NOT DETECTED Final   Staphylococcus aureus NOT DETECTED NOT DETECTED Final   Streptococcus species NOT DETECTED NOT DETECTED Final   Streptococcus agalactiae NOT DETECTED NOT DETECTED Final   Streptococcus pneumoniae NOT DETECTED NOT DETECTED Final   Streptococcus pyogenes NOT DETECTED NOT DETECTED Final   Acinetobacter baumannii NOT DETECTED NOT DETECTED Final   Enterobacteriaceae species DETECTED (A) NOT DETECTED Final    Comment: Enterobacteriaceae represent a large family of gram-negative bacteria, not a single organism. CRITICAL RESULT  CALLED TO, READ BACK BY AND VERIFIED WITH: Azzie Glatter Pharm.D. 10:05 09/15/16 ([email or web address])    Enterobacter cloacae complex NOT DETECTED NOT DETECTED Final   Escherichia coli DETECTED (A) NOT DETECTED Final    Comment: CRITICAL RESULT CALLED TO, READ BACK BY AND VERIFIED WITH: Mardee Postin.D. 10:05 09/15/16 ([email or web address])    Klebsiella oxytoca NOT DETECTED NOT DETECTED Final   Klebsiella pneumoniae NOT DETECTED NOT DETECTED Final   Proteus species NOT DETECTED NOT DETECTED Final   Serratia marcescens NOT DETECTED NOT DETECTED Final   Carbapenem resistance NOT DETECTED NOT DETECTED Final   Haemophilus influenzae NOT DETECTED NOT DETECTED Final   Neisseria meningitidis NOT DETECTED NOT DETECTED Final   Pseudomonas aeruginosa NOT DETECTED NOT DETECTED Final   Candida albicans NOT DETECTED NOT DETECTED Final   Candida glabrata NOT DETECTED NOT DETECTED Final   Candida krusei NOT DETECTED NOT DETECTED Final   Candida parapsilosis NOT DETECTED NOT DETECTED Final   Candida tropicalis NOT DETECTED NOT DETECTED Final    Comment: Performed at [facility], [address] [place], [place] [address]  Culture, Urine     Status: Abnormal   Collection Time: 09/14/16  7:23 PM  Result Value Ref Range Status   Specimen Description URINE, RANDOM  Final   Special Requests meropenem  Final   Culture >=100,000 COLONIES/mL ESCHERICHIA COLI (A)  Final   Report Status 09/17/2016 FINAL  Final   Organism ID, Bacteria ESCHERICHIA COLI (A)  Final      Susceptibility   Escherichia coli - MIC*    AMPICILLIN >=32 RESISTANT Resistant     CEFAZOLIN <=4 SENSITIVE Sensitive     CEFTRIAXONE <=1 SENSITIVE Sensitive     CIPROFLOXACIN >=4 RESISTANT Resistant     GENTAMICIN <=1 SENSITIVE Sensitive     IMIPENEM <=0.25 SENSITIVE Sensitive     NITROFURANTOIN <=16 SENSITIVE Sensitive     TRIMETH/SULFA >=320 RESISTANT Resistant     AMPICILLIN/SULBACTAM 16 INTERMEDIATE Intermediate     PIP/TAZO <=4 SENSITIVE Sensitive      Extended ESBL NEGATIVE Sensitive     * >=100,000 COLONIES/mL ESCHERICHIA COLI  Culture, blood (Routine X 2) w Reflex to ID Panel     Status: None (Preliminary result)   Collection Time: 09/16/16 11:45 AM  Result Value Ref Range Status   Specimen Description BLOOD BLOOD RIGHT ARM  Final   Special Requests   Final    BOTTLES DRAWN AEROBIC AND ANAEROBIC Blood Culture adequate volume   Culture   Final    NO GROWTH 4 DAYS Performed at  [facility], [address] [place], [place] [address]    Report Status PENDING  Incomplete  Culture, blood (Routine X 2) w Reflex to ID Panel     Status: None (Preliminary result)   Collection Time: 09/16/16 11:54 AM  Result Value Ref Range Status   Specimen Description BLOOD BLOOD LEFT HAND  Final   Special Requests IN PEDIATRIC BOTTLE Blood Culture adequate volume  Final   Culture   Final    NO GROWTH 4 DAYS Performed at [facility], [address], [place], [place] [address]    Report Status PENDING  Incomplete     Time coordinating discharge: 45 minutes  SIGNED:   Coralie Keens, MD  [facility] 09/20/2016, 2:02 PM Pager   If 7PM-7AM, please contact night-coverage [email or web address] Password [email or web address]

## 2016-09-21 LAB — CULTURE, BLOOD (ROUTINE X 2)
CULTURE: NO GROWTH
Culture: NO GROWTH
SPECIAL REQUESTS: ADEQUATE
Special Requests: ADEQUATE

## 2016-10-18 ENCOUNTER — Other Ambulatory Visit: Payer: Self-pay | Admitting: Urology

## 2016-10-25 ENCOUNTER — Encounter (HOSPITAL_COMMUNITY): Payer: Self-pay | Admitting: *Deleted

## 2016-10-25 NOTE — Progress Notes (Signed)
Preop instructions for:  Deborah AlandSusan Young                       Date of Birth  -06-14-1945                          Date of Procedure: 11/05/2016       Doctor:Dr. Berneice HeinrichManny  [facility]:0930 am Report to: Admitting  Procedure:Nephrolithotomy left percutaneous antegrade ureteroscopy, Holmium laser application  Any procedure time changes, MD office will notify you!   Do not eat or drink past midnight the night before your procedure.(To include any tube feedings-must be discontinued)  Reminder:Follow bowel prep instructions per MD office!   Take these morning medications only with sips of water.(or give through gastrostomy or feeding tube).  Note: No Insulin or Diabetic meds should be given or taken the morning of the procedure!   Facility contact:  Calton DachBrenda Mureness, LPN                 Phone: [phone]                   Health Care POA: Deborah Young (sister)  Phone- [phone]  Transportation contact phone#: Transported by EMS  Please send day of procedure:current med list and meds last taken that day, confirm nothing by mouth status from what time, Patient Demographic info( to include DNR status, problem list, allergies)   RN contact name/phone#: Calton DachBrenda Mureness, LPN            and Fax #: [phone]  Bring Insurance card and picture ID Leave all jewelry and other valuables at place where living( no metal or rings to be worn) No contact lens Women-no make-up, no lotions,perfumes,powders Men-no colognes,lotions  Any questions day of procedure,call Short Stay  Unit at [phone]!   Sent from :[facility]                   Phone:[phone]                                    [phone]  Sent by :Telford NabBeverly M. Cathlean SauerNanney, BSN,RN

## 2016-10-29 ENCOUNTER — Encounter (HOSPITAL_COMMUNITY): Payer: Self-pay | Admitting: *Deleted

## 2016-10-29 NOTE — Progress Notes (Signed)
Preop instructions for: Deborah Young                          Date of Birth  [birth year]                          Date of Procedure:  11/05/2016      Doctor: Dr Berneice Heinrich   Time to arrive at [facility]:  0930am Report to: Admitting  Procedure: Any procedure time changes, MD office will notify you!   Do not eat or drink past midnight the night before your procedure.(To include any tube feedings-must be discontinued) Reminder:Follow bowel prep instructions per MD office!   Take these morning medications only with sips of water.(or give through gastrostomy or feeding tube). Effexor ( if takes in an ), Spiriva Inhaler and send with her to hospital , Symbicort Inhaler and send with her to hospital , Lyrica ( if takes in am), Lamictal ( if takes in am , Primidone ( if takes in am ) , Norco if needed, Eye drops as usual, nasacort nasal spray   Note: No Insulin or Diabetic meds should be given or taken the morning of the procedure! Please send ostomy supplies with patient to hospital    Facility contact: Meriel Pica                  Phone:                  Health Care POA:Deborah Young ( sister)  [phone] Transportation contact phone#:  EMS   Please send day of procedure:current med list and meds last taken that day, confirm nothing by mouth status from what time, Patient Demographic info( to include DNR status, problem list, allergies)   RN contact name/phone#:    Meriel Pica                       and Fax #:  [phone]  Bring Insurance card and picture ID Leave all jewelry and other valuables at place where living( no metal or rings to be worn) No contact lens Women-no make-up, no lotions,perfumes,powders Men-no colognes,lotions  Any questions day of procedure,call Short Stay Center  [phone]   Sent from :[facility]                   Phone:[phone]                   [phone]  Sent by :RN

## 2016-11-02 NOTE — Progress Notes (Signed)
SPOKE WITH BRENDA MURENESS LPN ALL PRE OP INSTRUCTONS RECEIVED BY FAX AND UNDERSTOOD.

## 2016-11-04 MED ORDER — GENTAMICIN SULFATE 40 MG/ML IJ SOLN: 5.0000 mg/kg | INTRAMUSCULAR | Status: DC

## 2016-11-05 ENCOUNTER — Ambulatory Visit (HOSPITAL_COMMUNITY)
Admission: RE | Admit: 2016-11-05 | Discharge: 2016-11-05 | Disposition: A | Discharge status: Skilled Nursing Facility | Payer: Medicare Other | Source: Ambulatory Visit | Attending: Urology | Admitting: Urology

## 2016-11-05 ENCOUNTER — Encounter (HOSPITAL_COMMUNITY)
Admission: RE | Disposition: A | Discharge status: Skilled Nursing Facility | Payer: Self-pay | Source: Ambulatory Visit | Attending: Urology

## 2016-11-05 ENCOUNTER — Inpatient Hospital Stay (HOSPITAL_COMMUNITY): Payer: Medicare Other

## 2016-11-05 ENCOUNTER — Encounter (HOSPITAL_COMMUNITY): Payer: Self-pay | Admitting: *Deleted

## 2016-11-05 ENCOUNTER — Ambulatory Visit (HOSPITAL_COMMUNITY): Payer: Medicare Other

## 2016-11-05 ENCOUNTER — Inpatient Hospital Stay (HOSPITAL_COMMUNITY): Payer: Medicare Other | Admitting: Anesthesiology

## 2016-11-05 DIAGNOSIS — Z880 Allergy status to penicillin: Secondary | ICD-10-CM | POA: Insufficient documentation

## 2016-11-05 DIAGNOSIS — E785 Hyperlipidemia, unspecified: Secondary | ICD-10-CM | POA: Insufficient documentation

## 2016-11-05 DIAGNOSIS — I10 Essential (primary) hypertension: Secondary | ICD-10-CM | POA: Diagnosis not present

## 2016-11-05 DIAGNOSIS — N201 Calculus of ureter: Secondary | ICD-10-CM | POA: Insufficient documentation

## 2016-11-05 DIAGNOSIS — E669 Obesity, unspecified: Secondary | ICD-10-CM | POA: Insufficient documentation

## 2016-11-05 DIAGNOSIS — Z933 Colostomy status: Secondary | ICD-10-CM | POA: Insufficient documentation

## 2016-11-05 DIAGNOSIS — F0390 Unspecified dementia without behavioral disturbance: Secondary | ICD-10-CM | POA: Insufficient documentation

## 2016-11-05 DIAGNOSIS — J449 Chronic obstructive pulmonary disease, unspecified: Secondary | ICD-10-CM | POA: Diagnosis not present

## 2016-11-05 DIAGNOSIS — Z906 Acquired absence of other parts of urinary tract: Secondary | ICD-10-CM | POA: Diagnosis not present

## 2016-11-05 DIAGNOSIS — G629 Polyneuropathy, unspecified: Secondary | ICD-10-CM | POA: Insufficient documentation

## 2016-11-05 DIAGNOSIS — Z87442 Personal history of urinary calculi: Secondary | ICD-10-CM | POA: Diagnosis not present

## 2016-11-05 DIAGNOSIS — Z89421 Acquired absence of other right toe(s): Secondary | ICD-10-CM | POA: Diagnosis not present

## 2016-11-05 DIAGNOSIS — Z980 Intestinal bypass and anastomosis status: Secondary | ICD-10-CM | POA: Insufficient documentation

## 2016-11-05 DIAGNOSIS — Z419 Encounter for procedure for purposes other than remedying health state, unspecified: Secondary | ICD-10-CM

## 2016-11-05 DIAGNOSIS — Z7951 Long term (current) use of inhaled steroids: Secondary | ICD-10-CM | POA: Insufficient documentation

## 2016-11-05 DIAGNOSIS — F341 Dysthymic disorder: Secondary | ICD-10-CM | POA: Insufficient documentation

## 2016-11-05 DIAGNOSIS — G4700 Insomnia, unspecified: Secondary | ICD-10-CM | POA: Insufficient documentation

## 2016-11-05 DIAGNOSIS — K219 Gastro-esophageal reflux disease without esophagitis: Secondary | ICD-10-CM | POA: Insufficient documentation

## 2016-11-05 DIAGNOSIS — N261 Atrophy of kidney (terminal): Secondary | ICD-10-CM | POA: Insufficient documentation

## 2016-11-05 DIAGNOSIS — I2510 Atherosclerotic heart disease of native coronary artery without angina pectoris: Secondary | ICD-10-CM | POA: Insufficient documentation

## 2016-11-05 DIAGNOSIS — Z89442 Acquired absence of left ankle: Secondary | ICD-10-CM | POA: Insufficient documentation

## 2016-11-05 DIAGNOSIS — M810 Age-related osteoporosis without current pathological fracture: Secondary | ICD-10-CM | POA: Diagnosis not present

## 2016-11-05 DIAGNOSIS — Z87440 Personal history of urinary (tract) infections: Secondary | ICD-10-CM | POA: Diagnosis not present

## 2016-11-05 DIAGNOSIS — M4140 Neuromuscular scoliosis, site unspecified: Secondary | ICD-10-CM | POA: Diagnosis not present

## 2016-11-05 DIAGNOSIS — Z79891 Long term (current) use of opiate analgesic: Secondary | ICD-10-CM | POA: Insufficient documentation

## 2016-11-05 DIAGNOSIS — F39 Unspecified mood [affective] disorder: Secondary | ICD-10-CM | POA: Insufficient documentation

## 2016-11-05 DIAGNOSIS — G8929 Other chronic pain: Secondary | ICD-10-CM | POA: Insufficient documentation

## 2016-11-05 DIAGNOSIS — Z885 Allergy status to narcotic agent status: Secondary | ICD-10-CM | POA: Insufficient documentation

## 2016-11-05 DIAGNOSIS — Q059 Spina bifida, unspecified: Secondary | ICD-10-CM | POA: Diagnosis not present

## 2016-11-05 DIAGNOSIS — Z881 Allergy status to other antibiotic agents status: Secondary | ICD-10-CM | POA: Insufficient documentation

## 2016-11-05 DIAGNOSIS — K5900 Constipation, unspecified: Secondary | ICD-10-CM | POA: Insufficient documentation

## 2016-11-05 DIAGNOSIS — Z79899 Other long term (current) drug therapy: Secondary | ICD-10-CM | POA: Insufficient documentation

## 2016-11-05 DIAGNOSIS — Z7982 Long term (current) use of aspirin: Secondary | ICD-10-CM | POA: Insufficient documentation

## 2016-11-05 DIAGNOSIS — G839 Paralytic syndrome, unspecified: Secondary | ICD-10-CM | POA: Diagnosis not present

## 2016-11-05 DIAGNOSIS — G4730 Sleep apnea, unspecified: Secondary | ICD-10-CM | POA: Diagnosis not present

## 2016-11-05 DIAGNOSIS — Z6838 Body mass index (BMI) 38.0-38.9, adult: Secondary | ICD-10-CM | POA: Diagnosis not present

## 2016-11-05 DIAGNOSIS — Z87891 Personal history of nicotine dependence: Secondary | ICD-10-CM | POA: Insufficient documentation

## 2016-11-05 HISTORY — DX: Hyperlipidemia, unspecified: E78.5

## 2016-11-05 HISTORY — PX: NEPHROLITHOTOMY: SHX5134

## 2016-11-05 HISTORY — DX: Age-related osteoporosis without current pathological fracture: M81.0

## 2016-11-05 HISTORY — DX: Other chronic pain: G89.29

## 2016-11-05 HISTORY — DX: Personal history of other diseases of the digestive system: Z87.19

## 2016-11-05 HISTORY — DX: Insomnia, unspecified: G47.00

## 2016-11-05 HISTORY — DX: Unspecified dementia, unspecified severity, without behavioral disturbance, psychotic disturbance, mood disturbance, and anxiety: F03.90

## 2016-11-05 HISTORY — DX: Personal history of urinary calculi: Z87.442

## 2016-11-05 HISTORY — PX: HOLMIUM LASER APPLICATION: SHX5852

## 2016-11-05 HISTORY — DX: Personal history of urinary (tract) infections: Z87.440

## 2016-11-05 HISTORY — DX: Personal history of other specified (corrected) congenital malformations of nervous system and sense organs: Z87.728

## 2016-11-05 HISTORY — DX: Dizziness and giddiness: R42

## 2016-11-05 HISTORY — PX: URETEROSCOPY: SHX842

## 2016-11-05 HISTORY — DX: Edema, unspecified: R60.9

## 2016-11-05 LAB — CBC
HCT: 40.8 % (ref 36.0–46.0)
Hemoglobin: 12.8 g/dL (ref 12.0–15.0)
MCH: 29.3 pg (ref 26.0–34.0)
MCHC: 31.4 g/dL (ref 30.0–36.0)
MCV: 93.4 fL (ref 78.0–100.0)
Platelets: 303 10*3/uL (ref 150–400)
RBC: 4.37 MIL/uL (ref 3.87–5.11)
RDW: 16.6 % — AB (ref 11.5–15.5)
WBC: 11.1 10*3/uL — ABNORMAL HIGH (ref 4.0–10.5)

## 2016-11-05 LAB — BASIC METABOLIC PANEL
Anion gap: 9 (ref 5–15)
BUN: 16 mg/dL (ref 6–20)
CO2: 28 mmol/L (ref 22–32)
Calcium: 9.1 mg/dL (ref 8.9–10.3)
Chloride: 101 mmol/L (ref 101–111)
Creatinine, Ser: 0.64 mg/dL (ref 0.44–1.00)
GFR calc Af Amer: >60 mL/min (ref >60)
GLUCOSE: 106 mg/dL — AB (ref 65–99)
POTASSIUM: 4.6 mmol/L (ref 3.5–5.1)
Sodium: 138 mmol/L (ref 135–145)

## 2016-11-05 SURGERY — NEPHROLITHOTOMY PERCUTANEOUS
Anesthesia: General | Laterality: Left

## 2016-11-05 MED ORDER — SODIUM CHLORIDE 0.9 % IJ SOLN
INTRAMUSCULAR | Status: AC
  Filled 2016-11-05: qty 10

## 2016-11-05 MED ORDER — FENTANYL CITRATE (PF) 100 MCG/2ML IJ SOLN
INTRAMUSCULAR | Status: AC
  Administered 2016-11-05: 50 ug via INTRAVENOUS
  Filled 2016-11-05: qty 2

## 2016-11-05 MED ORDER — ACETAMINOPHEN 10 MG/ML IV SOLN
INTRAVENOUS | Status: AC
  Administered 2016-11-05: 1000 mg
  Filled 2016-11-05: qty 100

## 2016-11-05 MED ORDER — LIDOCAINE HCL (CARDIAC) 20 MG/ML IV SOLN
INTRAVENOUS | Status: DC | PRN
  Administered 2016-11-05: 75 mg via INTRAVENOUS
  Administered 2016-11-05: 25 mg via INTRATRACHEAL

## 2016-11-05 MED ORDER — SUGAMMADEX SODIUM 200 MG/2ML IV SOLN
INTRAVENOUS | Status: DC | PRN
Start: 2016-11-05 — End: 2016-11-05
  Administered 2016-11-05: 175 mg via INTRAVENOUS

## 2016-11-05 MED ORDER — MEPERIDINE HCL 50 MG/ML IJ SOLN: 6.2500 mg | INTRAMUSCULAR | Status: DC | PRN

## 2016-11-05 MED ORDER — DEXAMETHASONE SODIUM PHOSPHATE 10 MG/ML IJ SOLN
INTRAMUSCULAR | Status: AC
  Filled 2016-11-05: qty 1

## 2016-11-05 MED ORDER — ROCURONIUM BROMIDE 100 MG/10ML IV SOLN
INTRAVENOUS | Status: DC | PRN
  Administered 2016-11-05: 40 mg via INTRAVENOUS

## 2016-11-05 MED ORDER — FENTANYL CITRATE (PF) 100 MCG/2ML IJ SOLN
50.0000 ug | INTRAMUSCULAR | Status: DC | PRN
  Administered 2016-11-05 (×2): 50 ug via INTRAVENOUS

## 2016-11-05 MED ORDER — ONDANSETRON HCL 4 MG/2ML IJ SOLN: 4.0000 mg | Freq: Once | INTRAMUSCULAR | Status: DC | PRN

## 2016-11-05 MED ORDER — DIPHENHYDRAMINE HCL 50 MG/ML IJ SOLN
INTRAMUSCULAR | Status: AC
  Administered 2016-11-05: 25 mg
  Filled 2016-11-05: qty 1

## 2016-11-05 MED ORDER — LIDOCAINE 2% (20 MG/ML) 5 ML SYRINGE
INTRAMUSCULAR | Status: AC
  Filled 2016-11-05: qty 5

## 2016-11-05 MED ORDER — SUFENTANIL CITRATE 50 MCG/ML IV SOLN
INTRAVENOUS | Status: DC | PRN
  Administered 2016-11-05: 5 ug via INTRAVENOUS

## 2016-11-05 MED ORDER — MIDAZOLAM HCL 2 MG/2ML IJ SOLN
INTRAMUSCULAR | Status: AC
  Filled 2016-11-05: qty 2

## 2016-11-05 MED ORDER — KETOROLAC TROMETHAMINE 30 MG/ML IJ SOLN
INTRAMUSCULAR | Status: AC
  Filled 2016-11-05: qty 1

## 2016-11-05 MED ORDER — SUFENTANIL CITRATE 50 MCG/ML IV SOLN
INTRAVENOUS | Status: AC
  Filled 2016-11-05: qty 1

## 2016-11-05 MED ORDER — ACETAMINOPHEN 160 MG/5ML PO SOLN: 325.0000 mg | ORAL | Status: DC | PRN

## 2016-11-05 MED ORDER — GENTAMICIN SULFATE 40 MG/ML IJ SOLN
5.0000 mg/kg | INTRAVENOUS | Status: AC
  Administered 2016-11-05: 420 mg via INTRAVENOUS
  Filled 2016-11-05: qty 10.5

## 2016-11-05 MED ORDER — IOPAMIDOL (ISOVUE-300) INJECTION 61%
INTRAVENOUS | Status: DC | PRN
  Administered 2016-11-05: 25 mL via URETHRAL

## 2016-11-05 MED ORDER — ROCURONIUM BROMIDE 50 MG/5ML IV SOSY
PREFILLED_SYRINGE | INTRAVENOUS | Status: AC
  Filled 2016-11-05: qty 5

## 2016-11-05 MED ORDER — EPHEDRINE SULFATE 50 MG/ML IJ SOLN
INTRAMUSCULAR | Status: DC | PRN
  Administered 2016-11-05: 15 mg via INTRAVENOUS

## 2016-11-05 MED ORDER — KETOROLAC TROMETHAMINE 30 MG/ML IJ SOLN
30.0000 mg | Freq: Once | INTRAMUSCULAR | Status: DC | PRN
  Administered 2016-11-05: 30 mg via INTRAVENOUS

## 2016-11-05 MED ORDER — SODIUM CHLORIDE 0.9 % IR SOLN
Status: DC | PRN
  Administered 2016-11-05: 1000 mL

## 2016-11-05 MED ORDER — PROPOFOL 10 MG/ML IV BOLUS
INTRAVENOUS | Status: DC | PRN
  Administered 2016-11-05: 110 mg via INTRAVENOUS

## 2016-11-05 MED ORDER — FENTANYL CITRATE (PF) 100 MCG/2ML IJ SOLN
INTRAMUSCULAR | Status: DC
Start: 2016-11-05 — End: 2016-11-05
  Filled 2016-11-05: qty 2

## 2016-11-05 MED ORDER — DEXAMETHASONE SODIUM PHOSPHATE 10 MG/ML IJ SOLN
INTRAMUSCULAR | Status: DC | PRN
  Administered 2016-11-05: 10 mg via INTRAVENOUS

## 2016-11-05 MED ORDER — ACETAMINOPHEN 325 MG PO TABS: 325.0000 mg | ORAL_TABLET | ORAL | Status: DC | PRN

## 2016-11-05 MED ORDER — EPHEDRINE 5 MG/ML INJ
INTRAVENOUS | Status: AC
  Filled 2016-11-05: qty 10

## 2016-11-05 MED ORDER — FENTANYL CITRATE (PF) 100 MCG/2ML IJ SOLN
INTRAMUSCULAR | Status: AC
  Filled 2016-11-05: qty 2

## 2016-11-05 MED ORDER — SUGAMMADEX SODIUM 200 MG/2ML IV SOLN
INTRAVENOUS | Status: AC
  Filled 2016-11-05: qty 2

## 2016-11-05 MED ORDER — HYDROMORPHONE HCL-NACL 0.5-0.9 MG/ML-% IV SOSY
PREFILLED_SYRINGE | INTRAVENOUS | Status: AC
  Administered 2016-11-05: 1 mg
  Filled 2016-11-05: qty 2

## 2016-11-05 MED ORDER — PHENYLEPHRINE HCL 10 MG/ML IJ SOLN
INTRAMUSCULAR | Status: DC | PRN
  Administered 2016-11-05: 80 ug via INTRAVENOUS

## 2016-11-05 MED ORDER — LACTATED RINGERS IV SOLN
INTRAVENOUS | Status: DC
  Administered 2016-11-05: 14:00:00 via INTRAVENOUS
  Administered 2016-11-05: 1000 mL via INTRAVENOUS

## 2016-11-05 MED ORDER — PROPOFOL 10 MG/ML IV BOLUS
INTRAVENOUS | Status: AC
  Filled 2016-11-05: qty 20

## 2016-11-05 MED ORDER — NITROFURANTOIN MONOHYD MACRO 100 MG PO CAPS: 100.0000 mg | ORAL_CAPSULE | Freq: Two times a day (BID) | ORAL | 0 refills | Status: DC

## 2016-11-05 MED ORDER — OXYCODONE HCL 5 MG PO TABS: 5.0000 mg | ORAL_TABLET | Freq: Once | ORAL | Status: DC | PRN

## 2016-11-05 MED ORDER — FENTANYL CITRATE (PF) 100 MCG/2ML IJ SOLN
25.0000 ug | INTRAMUSCULAR | Status: DC | PRN
  Administered 2016-11-05 (×3): 50 ug via INTRAVENOUS

## 2016-11-05 MED ORDER — OXYCODONE HCL 5 MG/5ML PO SOLN
5.0000 mg | Freq: Once | ORAL | Status: DC | PRN
Start: 2016-11-05 — End: 2016-11-05
  Filled 2016-11-05: qty 5

## 2016-11-05 SURGICAL SUPPLY — 63 items
BAG URINE DRAINAGE (UROLOGICAL SUPPLIES) ×6 IMPLANT
BAG URO CATCHER STRL LF (MISCELLANEOUS) IMPLANT
BASKET LASER NITINOL 1.9FR (BASKET) IMPLANT
BASKET ZERO TIP NITINOL 2.4FR (BASKET) IMPLANT
BENZOIN TINCTURE PRP APPL 2/3 (GAUZE/BANDAGES/DRESSINGS) ×6 IMPLANT
BLADE SURG 15 STRL LF DISP TIS (BLADE) IMPLANT
BLADE SURG 15 STRL SS (BLADE)
CATH FOLEY 2W COUNCIL 20FR 5CC (CATHETERS) IMPLANT
CATH FOLEY 2WAY SLVR  5CC 16FR (CATHETERS)
CATH FOLEY 2WAY SLVR 5CC 16FR (CATHETERS) IMPLANT
CATH IMAGER II 65CM (CATHETERS) IMPLANT
CATH INTERMIT  6FR 70CM (CATHETERS) IMPLANT
CATH MULTI PURPOSE 16FR DRAIN (STENTS) IMPLANT
CATH ROBINSON RED A/P 20FR (CATHETERS) IMPLANT
CATH ULTRATHANE 14FR (CATHETERS) IMPLANT
CATH UROLOGY TORQUE 40 (MISCELLANEOUS) ×3 IMPLANT
CATH X-FORCE N30 NEPHROSTOMY (TUBING) IMPLANT
CHLORAPREP W/TINT 26ML (MISCELLANEOUS) ×3 IMPLANT
COVER SURGICAL LIGHT HANDLE (MISCELLANEOUS) ×3 IMPLANT
DRAPE C-ARM 42X120 X-RAY (DRAPES) ×3 IMPLANT
DRAPE LINGEMAN PERC (DRAPES) ×3 IMPLANT
DRAPE SHEET LG 3/4 BI-LAMINATE (DRAPES) ×3 IMPLANT
DRAPE SURG IRRIG POUCH 19X23 (DRAPES) ×3 IMPLANT
DRSG PAD ABDOMINAL 8X10 ST (GAUZE/BANDAGES/DRESSINGS) ×3 IMPLANT
DRSG TEGADERM 8X12 (GAUZE/BANDAGES/DRESSINGS) ×3 IMPLANT
FIBER LASER FLEXIVA 1000 (UROLOGICAL SUPPLIES) IMPLANT
FIBER LASER FLEXIVA 365 (UROLOGICAL SUPPLIES) IMPLANT
FIBER LASER FLEXIVA 550 (UROLOGICAL SUPPLIES) IMPLANT
FIBER LASER TRAC TIP (UROLOGICAL SUPPLIES) ×3 IMPLANT
GAUZE SPONGE 4X4 12PLY STRL (GAUZE/BANDAGES/DRESSINGS) IMPLANT
GLOVE BIOGEL M STRL SZ7.5 (GLOVE) ×9 IMPLANT
GOWN STRL REUS W/TWL LRG LVL3 (GOWN DISPOSABLE) ×6 IMPLANT
GUIDEWIRE AMPLAZ .035X145 (WIRE) ×3 IMPLANT
GUIDEWIRE ANG ZIPWIRE 038X150 (WIRE) ×3 IMPLANT
GUIDEWIRE STR DUAL SENSOR (WIRE) IMPLANT
IV SET EXTENSION CATH 6 NF (IV SETS) IMPLANT
KIT BASIN OR (CUSTOM PROCEDURE TRAY) ×3 IMPLANT
MANIFOLD NEPTUNE II (INSTRUMENTS) ×3 IMPLANT
NEEDLE TROCAR 18X15 ECHO (NEEDLE) IMPLANT
NEEDLE TROCAR 18X20 (NEEDLE) IMPLANT
NS IRRIG 1000ML POUR BTL (IV SOLUTION) ×3 IMPLANT
PACK CYSTO (CUSTOM PROCEDURE TRAY) ×3 IMPLANT
PROBE LITHOCLAST ULTRA 3.8X403 (UROLOGICAL SUPPLIES) IMPLANT
PROBE PNEUMATIC 1.0MMX570MM (UROLOGICAL SUPPLIES) IMPLANT
SET IRRIG Y TYPE TUR BLADDER L (SET/KITS/TRAYS/PACK) IMPLANT
SHEATH ACCESS URETERAL 24CM (SHEATH) ×3 IMPLANT
SHEATH PEELAWAY SET 9 (SHEATH) ×3 IMPLANT
SPONGE LAP 4X18 X RAY DECT (DISPOSABLE) ×3 IMPLANT
STONE CATCHER W/TUBE ADAPTER (UROLOGICAL SUPPLIES) IMPLANT
SUT SILK 2 0 30  PSL (SUTURE)
SUT SILK 2 0 30 PSL (SUTURE) IMPLANT
SUT SILK 3 0 SH 30 (SUTURE) ×3 IMPLANT
SYR 10ML LL (SYRINGE) IMPLANT
SYR 20CC LL (SYRINGE) ×3 IMPLANT
SYR 50ML LL SCALE MARK (SYRINGE) IMPLANT
TOWEL OR 17X26 10 PK STRL BLUE (TOWEL DISPOSABLE) ×3 IMPLANT
TRAY FOLEY W/METER SILVER 16FR (SET/KITS/TRAYS/PACK) IMPLANT
TUBE CONNECTING VINYL 14FR 30C (MISCELLANEOUS) ×3 IMPLANT
TUBE FEEDING 8FR 16IN STR KANG (MISCELLANEOUS) IMPLANT
TUBING CONNECTING 10 (TUBING) ×2 IMPLANT
TUBING CONNECTING 10' (TUBING) ×1
WATER STERILE IRR 1500ML POUR (IV SOLUTION) ×3 IMPLANT
WATER STERILE IRR 3000ML UROMA (IV SOLUTION) ×3 IMPLANT

## 2016-11-05 NOTE — Progress Notes (Signed)
Report to Nurse at [facility] Lockie Mola LPN at [place] facility

## 2016-11-05 NOTE — Progress Notes (Signed)
Patient to transport to skilled nursing facility by [facility].

## 2016-11-05 NOTE — Interval H&P Note (Signed)
History and Physical Interval Note:  11/05/2016 12:34 PM  Deborah Young  has presented today for surgery, with the diagnosis of LEFT URETERAL / RENAL STONE  The various methods of treatment have been discussed with the patient and family. After consideration of risks, benefits and other options for treatment, the patient has consented to  Procedure(s): NEPHROLITHOTOMY LEFT PERCUTANEOUS (Left) HOLMIUM LASER APPLICATION (Left) ANTEGRADE URETEROSCOPY (Left) as a surgical intervention .  The patient's history has been reviewed, patient examined, no change in status, stable for surgery.  I have reviewed the patient's chart and labs.  Questions were answered to the patient's satisfaction.     Deborah Young

## 2016-11-05 NOTE — Brief Op Note (Signed)
11/05/2016  3:27 PM  PATIENT:  Deborah Young  71 y.o. female  PRE-OPERATIVE DIAGNOSIS:  LEFT URETERAL / RENAL STONE  POST-OPERATIVE DIAGNOSIS:  LEFT URETERAL / RENAL STONE  PROCEDURE:  Procedure(s): NEPHROLITHOTOMY LEFT PERCUTANEOUS (Left) HOLMIUM LASER APPLICATION (Left) ANTEGRADE URETEROSCOPY (Left)  SURGEON:  Surgeon(s) and Role:    Sebastian Ache, MD - Primary  PHYSICIAN ASSISTANT:   ASSISTANTS: none   ANESTHESIA:   general  EBL:  Total I/O In: 1000 [I.V.:1000] Out: 225 [Urine:225]  BLOOD ADMINISTERED:none  DRAINS: Left nephroureteral stent (capped), Urostomy to gravity   LOCAL MEDICATIONS USED:  NONE  SPECIMEN:  Source of Specimen:  Left ureteral stone fragmetns.   DISPOSITION OF SPECIMEN:  [facility] for compositional analysis  COUNTS:  YES  TOURNIQUET:  * No tourniquets in log *  DICTATION: .Other Dictation: Dictation Number  [id number]  PLAN OF CARE: Discharge to home after PACU  PATIENT DISPOSITION:  PACU - hemodynamically stable.   Delay start of Pharmacological VTE agent (>24hrs) due to surgical blood loss or risk of bleeding: yes

## 2016-11-05 NOTE — Anesthesia Procedure Notes (Signed)
Procedure Name: Intubation Date/Time: 11/05/2016 2:12 PM Performed by: Lissa Morales Pre-anesthesia Checklist: Patient identified, Emergency Drugs available, Suction available and Patient being monitored Patient Re-evaluated:Patient Re-evaluated prior to induction Oxygen Delivery Method: Circle system utilized Preoxygenation: Pre-oxygenation with 100% oxygen Induction Type: IV induction Ventilation: Mask ventilation without difficulty Laryngoscope Size: Mac and 3 Grade View: Grade II Tube type: Oral Tube size: 7.0 mm Number of attempts: 1 Airway Equipment and Method: Stylet and Oral airway Placement Confirmation: ETT inserted through vocal cords under direct vision,  positive ETCO2 and breath sounds checked- equal and bilateral Secured at: 21 cm Tube secured with: Tape Dental Injury: Teeth and Oropharynx as per pre-operative assessment

## 2016-11-05 NOTE — Anesthesia Postprocedure Evaluation (Signed)
Anesthesia Post Note  Patient: Deborah Young  Procedure(s) Performed: Procedure(s) (LRB): NEPHROLITHOTOMY LEFT PERCUTANEOUS (Left) HOLMIUM LASER APPLICATION (Left) ANTEGRADE URETEROSCOPY (Left)     Patient location during evaluation: PACU Anesthesia Type: General Level of consciousness: sedated Pain management: pain level controlled Vital Signs Assessment: post-procedure vital signs reviewed and stable Respiratory status: spontaneous breathing Cardiovascular status: stable Postop Assessment: no signs of nausea or vomiting Anesthetic complications: no    Last Vitals:  Vitals:   11/05/16 1715 11/05/16 1730  BP: (!) 182/88 (!) 153/84  Pulse: 85 84  Resp: 14 15  Temp:    SpO2: 93% 93%    Last Pain:  Vitals:   11/05/16 1730  TempSrc:   PainSc: Asleep   Pain Goal: Patients Stated Pain Goal: 4 (11/05/16 1315)               Talissa Apple JR,JOHN Susann Givens

## 2016-11-05 NOTE — Transfer of Care (Signed)
Immediate Anesthesia Transfer of Care Note  Patient: Deborah Young  Procedure(s) Performed: Procedure(s): NEPHROLITHOTOMY LEFT PERCUTANEOUS (Left) HOLMIUM LASER APPLICATION (Left) ANTEGRADE URETEROSCOPY (Left)  Patient Location: PACU  Anesthesia Type:General  Level of Consciousness:  sedated, patient cooperative and responds to stimulation  Airway & Oxygen Therapy:Patient Spontanous Breathing and Patient connected to face mask oxgen  Post-op Assessment:  Report given to PACU RN and Post -op Vital signs reviewed and stable  Post vital signs:  Reviewed and stable  Last Vitals:  Vitals:   11/05/16 1330 11/05/16 1345  BP:    Pulse:    Resp:    Temp:    SpO2: 91% 97%    Complications: No apparent anesthesia complications

## 2016-11-05 NOTE — Discharge Instructions (Signed)
General Anesthesia, Adult, Care After °These instructions provide you with information about caring for yourself after your procedure. Your health care provider may also give you more specific instructions. Your treatment has been planned according to current medical practices, but problems sometimes occur. Call your health care provider if you have any problems or questions after your procedure. °What can I expect after the procedure? °After the procedure, it is common to have: °· Vomiting. °· A sore throat. °· Mental slowness. ° °It is common to feel: °· Nauseous. °· Cold or shivery. °· Sleepy. °· Tired. °· Sore or achy, even in parts of your body where you did not have surgery. ° °Follow these instructions at home: °For at least 24 hours after the procedure: °· Do not: °? Participate in activities where you could fall or become injured. °? Drive. °? Use heavy machinery. °? Drink alcohol. °? Take sleeping pills or medicines that cause drowsiness. °? Make important decisions or sign legal documents. °? Take care of children on your own. °· Rest. °Eating and drinking °· If you vomit, drink water, juice, or soup when you can drink without vomiting. °· Drink enough fluid to keep your urine clear or pale yellow. °· Make sure you have little or no nausea before eating solid foods. °· Follow the diet recommended by your health care provider. °General instructions °· Have a responsible adult stay with you until you are awake and alert. °· Return to your normal activities as told by your health care provider. Ask your health care provider what activities are safe for you. °· Take over-the-counter and prescription medicines only as told by your health care provider. °· If you smoke, do not smoke without supervision. °· Keep all follow-up visits as told by your health care provider. This is important. °Contact a health care provider if: °· You continue to have nausea or vomiting at home, and medicines are not helpful. °· You  cannot drink fluids or start eating again. °· You cannot urinate after 8-12 hours. °· You develop a skin rash. °· You have fever. °· You have increasing redness at the site of your procedure. °Get help right away if: °· You have difficulty breathing. °· You have chest pain. °· You have unexpected bleeding. °· You feel that you are having a life-threatening or urgent problem. °This information is not intended to replace advice given to you by your health care provider. Make sure you discuss any questions you have with your health care provider. °Document Released: 06/07/2000 Document Revised: 08/04/2015 Document Reviewed: 02/13/2015 °Elsevier Interactive Patient Education © 2018 [employer] °1 - You may have  bloody urine on / off with stent in place. This is normal. ° °2 - Call MD or go to ER for fever >102, severe pain / nausea / vomiting not relieved by medications, or acute change in medical status ° °

## 2016-11-05 NOTE — H&P (Signed)
Deborah Young is an 71 y.o. female.    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 No additional left sided stones   2 - Spina Bifida s/p Cystectomy - s/p cystectomy with conduit diversion as teenager for spina bifida. Baseline Cr 1.0   3 - Atrophic Right Kidney - nearly completely atrophic right kidney by ER CT 09/2016.   4 - Gram Negative Bacteremia - GNR in [facility] 09/2016, e. Coli and enterobacter (sens macrobid, gent, keflex). Left obstructing stone as per above now s/p decompression. Treated with rocephin course and now on macrobid proph prior to surgery today.   PMH sig for spina bifida, colostomy, urostomy, CAD (not limiting, no blood thinners), lives skilled nursing at baseline, sister very involved.   Today "[name]" is seen to proceed with LEFT PCNL / antegrade ureteroscopy.    Past Medical History:  Diagnosis Date  . Acquired absence of ankle, left (HCC)   . Atherosclerotic heart disease of native coronary artery without angina pectoris   . Chronic pain   . Constipation   . COPD (chronic obstructive pulmonary disease) (HCC)   . Dementia    per H&P from nursing home  . Dizziness   . Dysthymic disorder   . Edema   . Generalized muscle weakness   . GERD (gastroesophageal reflux disease)   . H/O small bowel obstruction    per H&P from Nursing home  . History of kidney stones    UPJ stone per H&P from nursing home  . History of spina bifida    per H&P from nursing home  . History of urinary tract infection   . Hyperlipidemia   . Hypertension   . Insomnia   . Mood disorder (HCC)   . Obesity   . Osteoporosis   . Paralytic syndrome (HCC)   . Polyneuropathy   . Secondary scoliosis   . Sleep apnea    CPAP    Past Surgical History:   Procedure Laterality Date  . COLON SURGERY    . COLOSTOMY    . IR NEPHROSTOMY PLACEMENT LEFT  09/14/2016  . LEFT ANKLE AMPUTATION    . right foot multiple toe amputations     . ROBOT ASSISTED LAPAROSCOPIC COMPLETE CYSTECT ILEAL CONDUIT      History reviewed. No pertinent family history. Social History:  reports that she has quit smoking. Her smoking use included Cigarettes. She has never used smokeless tobacco. She reports that she does not drink alcohol or use drugs.  Allergies:  Allergies  Allergen Reactions  . Codeine   . Erythromycin Other (See Comments)    Per MAR  . Penicillins Other (See Comments)    Per MAR  Has patient had a PCN reaction causing immediate rash, facial/tongue/throat swelling, SOB or lightheadedness with hypotension: Unknown Has patient had a PCN reaction causing severe rash involving mucus membranes or skin necrosis: Unknown Has patient had a PCN reaction that required hospitalization: Unknown Has patient had a PCN reaction occurring within the last 10 years: Unknown If all of the above answers are "NO", then may proceed with Cephalosporin use.    No prescriptions prior to admission.    No results found for this or any previous visit (from the past 48 hour(s)). No results found.  Review  of Systems  Constitutional: Negative.  Negative for chills and fever.  HENT: Negative.   Eyes: Negative.   Respiratory: Negative.   Cardiovascular: Negative.   Gastrointestinal: Negative for nausea and vomiting.  Genitourinary: Negative.   Musculoskeletal: Negative.   Skin: Negative.   Neurological: Negative.   Endo/Heme/Allergies: Negative.   Psychiatric/Behavioral: Negative.     There were no vitals taken for this visit. Physical Exam  Constitutional: She appears well-developed.  Stigmata of spina bifida  Eyes: Pupils are equal, round, and reactive to light.  Neck: Normal range of motion.  Cardiovascular: Normal rate.   Respiratory: Effort normal.   GI: Soft.  Large truncal obesity. Urostomy with yellow urine.   Genitourinary:  Genitourinary Comments: Left neph tube in place, c/d/i with yellow urine.   Neurological: She is alert.  Psychiatric: She has a normal mood and affect.     Assessment/Plan  Proceed as planned with LEFT antegrade ureteroscopy / PCNL for stone in funcionally solitary kidney and variant GU anatomy with urinary diversion. Risks, benefits, alternatives, expected peri-op course discussed previously and reiterated today.   Sebastian Ache, MD 11/05/2016, 7:24 AM

## 2016-11-05 NOTE — Anesthesia Preprocedure Evaluation (Signed)
Anesthesia Evaluation  Patient identified by MRN, date of birth, ID band Patient awake    Reviewed: Allergy & Precautions, NPO status , Patient's Chart, lab work & pertinent test results  Airway Mallampati: III       Dental  (+) Poor Dentition,    Pulmonary former smoker,    Pulmonary exam normal breath sounds clear to auscultation       Cardiovascular hypertension, Normal cardiovascular exam Rhythm:Regular Rate:Normal     Neuro/Psych    GI/Hepatic Neg liver ROS,   Endo/Other  negative endocrine ROS  Renal/GU negative Renal ROS     Musculoskeletal negative musculoskeletal ROS (+)   Abdominal (+) + obese,   Peds  Hematology negative hematology ROS (+)   Anesthesia Other Findings   Reproductive/Obstetrics                             Anesthesia Physical Anesthesia Plan  ASA: II  Anesthesia Plan: General   Post-op Pain Management:    Induction: Intravenous  PONV Risk Score and Plan: 4 or greater and Ondansetron, Dexamethasone, Midazolam, Scopolamine patch - Pre-op, Propofol infusion and Treatment may vary due to age or medical condition  Airway Management Planned: Oral ETT  Additional Equipment:   Intra-op Plan:   Post-operative Plan: Extubation in OR  Informed Consent: I have reviewed the patients History and Physical, chart, labs and discussed the procedure including the risks, benefits and alternatives for the proposed anesthesia with the patient or authorized representative who has indicated his/her understanding and acceptance.   Dental advisory given  Plan Discussed with: CRNA and Surgeon  Anesthesia Plan Comments:         Anesthesia Quick Evaluation

## 2016-11-06 ENCOUNTER — Encounter (HOSPITAL_COMMUNITY): Payer: Self-pay | Admitting: Urology

## 2016-11-07 NOTE — Op Note (Signed)
NAME:  Deborah Young, Deborah Young                    ACCOUNT NO.:  MEDICAL RECORD NO.:  [id number]  LOCATION:                                 FACILITY:  PHYSICIAN:  Sebastian Ache, MD     DATE OF BIRTH:  11-15-1945  DATE OF PROCEDURE: 11/05/2016                              OPERATIVE REPORT   DIAGNOSES: 1. Left ureteral stone. 2. History of spina bifida with ileal conduit, functionally solitary     left kidney.  PROCEDURES: 1. Left percutaneous nephrostolithotomy stone less than 2.5 cm. 2. Left antegrade nephrostogram with interpretation. 3. Left ureteroscopy with basketing of stone. 4. Exchange of left nephroureteral stent.  ESTIMATED BLOOD LOSS:  Nil.  COMPLICATION:  None.  SPECIMEN:  Left ureteral and renal stone fragments for compositional analysis.  FINDINGS: 1. Wide open ureteroileal anastomosis. 2. Left proximal ureteral stone. 3. Complete resolution of all stone fragments larger than 1/3rd mm in     the kidney and ureter to the level of the conduit following     percutaneous nephrostolithotomy and antegrade ureteroscopy. 4. Successful placement of left nephroureteral stent, proximal end     externalized and capped; distal end within the conduit segment.  INDICATION:  Ms. Dedmon is a 71 year old lady with history of severe spina bifida, status post ileal conduit urinary diversion as a child. She has atrophic right kidney.  She was found to have acute renal failure and left ureteral stone last month.  She underwent temporizing with left nephrostomy tube since cleared infectious parameters, now presents for percutaneous nephrostolithotomy with antegrade ureteroscopy with goal of stone free.  Informed consent was obtained and placed in the medical record.  She has been on preoperative antibiotics according to the most recent cultures.  PROCEDURE IN DETAIL:  The patient being Chedva Alleyne, was verified. Procedure being left percutaneous nephrostolithotomy and  antegrade ureteroscopy was confirmed.  Procedure was carried out.  Time-out was performed.  Intravenous antibiotics were administered.  General endotracheal anesthesia introduced.  The patient was placed into a prone position employing prone view, chest rolls, sequential compression devices, table was flexed to 15 degrees.  Bony prominences were padded. Dominant stone was placed on urostomy and colostomy.  Urostomy was placed to gravity drainage.  The patient's left flank and nephrostomy tube were prepped into a flank incision with chlorhexidine gluconate and then capped, and percutaneous drape was applied.  Initial antegrade nephrostogram revealed continued placement of left nephrostomy tube in the renal pelvis.  There were no obvious filling defects at this point. Beginning the large stone, it was felt that it was likely still present. A 0.038 Zip wire was advanced at the level of the renal pelvis and coiled.  Nephrostomy tube was removed and using a KMP catheter for guidance, this navigated down the ureter to the level of the ileal conduit and exchanged for a Super Stiff wire via the KMP catheter. Next, a dual axial lumen introducer was placed at the level of the mid- ureter allowing placement of a Zip wire down the level of the conduit and the lumen introducer was exchanged for a 12/14, 24-cm ureteral access sheath at the level of the mid-ureter using  continuous fluoroscopic guidance.  Next, antegrade ureteroscopy and nephroscopy were performed using a flexible digital ureteroscope near the proximal ureter.  A large single solitary stone was noted, this appeared to large for simple basketing.  As such, holmium laser energy applied to the stone using setting of 0.2 joules and 20 Hz, and was fragmented approximately five smaller pieces.  These were then sequentially grasped in Escape basket, removed, set aside for compositional analysis. Additional fragments, which were retrogradely  positioned in the kidney were also removed via basketing technique.  Following these maneuvers, there was complete resolution of all stone fragments larger than 1/3rd mm within the all accessible portions of the kidney and the left ureter down to the level of the conduit.  Next, the sheath was removed and a new KMP catheter was advanced to the level of the conduit acting as an externalized nephroureteral stent.  This was capped.  This was sutured in place using interrupted silk and a single U stitch of silk. Hemostasis was excellent.  Sponge and needle counts were correct. Procedure was then terminated.  The patient tolerated the procedure well.  There were no immediate periprocedural complications.  The patient was taken to the postanesthesia care unit in stable condition. Notably, as no new dilation was performed and she has an established tract, I felt that discharge back to skilled nursing today is appropriate.          ______________________________ Sebastian Ache, MD     TM/MEDQ  D:  11/05/2016  T:  11/06/2016  Job:  [id number]

## 2017-05-17 ENCOUNTER — Inpatient Hospital Stay (HOSPITAL_COMMUNITY)
Admission: EM | Admit: 2017-05-17 | Discharge: 2017-05-20 | DRG: 189 | Disposition: A | Discharge status: Skilled Nursing Facility | Payer: Medicare Other | Attending: Internal Medicine | Admitting: Internal Medicine

## 2017-05-17 ENCOUNTER — Emergency Department (HOSPITAL_COMMUNITY): Payer: Medicare Other

## 2017-05-17 ENCOUNTER — Other Ambulatory Visit: Payer: Self-pay

## 2017-05-17 ENCOUNTER — Encounter (HOSPITAL_COMMUNITY): Payer: Self-pay | Admitting: *Deleted

## 2017-05-17 DIAGNOSIS — Q600 Renal agenesis, unilateral: Secondary | ICD-10-CM

## 2017-05-17 DIAGNOSIS — F419 Anxiety disorder, unspecified: Secondary | ICD-10-CM | POA: Diagnosis present

## 2017-05-17 DIAGNOSIS — F0390 Unspecified dementia without behavioral disturbance: Secondary | ICD-10-CM | POA: Diagnosis present

## 2017-05-17 DIAGNOSIS — E872 Acidosis: Secondary | ICD-10-CM | POA: Diagnosis present

## 2017-05-17 DIAGNOSIS — E669 Obesity, unspecified: Secondary | ICD-10-CM | POA: Diagnosis present

## 2017-05-17 DIAGNOSIS — Q059 Spina bifida, unspecified: Secondary | ICD-10-CM

## 2017-05-17 DIAGNOSIS — Z936 Other artificial openings of urinary tract status: Secondary | ICD-10-CM

## 2017-05-17 DIAGNOSIS — F209 Schizophrenia, unspecified: Secondary | ICD-10-CM | POA: Diagnosis present

## 2017-05-17 DIAGNOSIS — Z87442 Personal history of urinary calculi: Secondary | ICD-10-CM

## 2017-05-17 DIAGNOSIS — J449 Chronic obstructive pulmonary disease, unspecified: Secondary | ICD-10-CM | POA: Diagnosis present

## 2017-05-17 DIAGNOSIS — Z87891 Personal history of nicotine dependence: Secondary | ICD-10-CM

## 2017-05-17 DIAGNOSIS — N3000 Acute cystitis without hematuria: Secondary | ICD-10-CM | POA: Diagnosis not present

## 2017-05-17 DIAGNOSIS — E662 Morbid (severe) obesity with alveolar hypoventilation: Secondary | ICD-10-CM | POA: Diagnosis not present

## 2017-05-17 DIAGNOSIS — I10 Essential (primary) hypertension: Secondary | ICD-10-CM | POA: Diagnosis present

## 2017-05-17 DIAGNOSIS — J9601 Acute respiratory failure with hypoxia: Secondary | ICD-10-CM | POA: Diagnosis present

## 2017-05-17 DIAGNOSIS — Z7401 Bed confinement status: Secondary | ICD-10-CM

## 2017-05-17 DIAGNOSIS — I2510 Atherosclerotic heart disease of native coronary artery without angina pectoris: Secondary | ICD-10-CM | POA: Diagnosis present

## 2017-05-17 DIAGNOSIS — F329 Major depressive disorder, single episode, unspecified: Secondary | ICD-10-CM | POA: Diagnosis present

## 2017-05-17 DIAGNOSIS — G8929 Other chronic pain: Secondary | ICD-10-CM | POA: Diagnosis present

## 2017-05-17 DIAGNOSIS — T4395XA Adverse effect of unspecified psychotropic drug, initial encounter: Secondary | ICD-10-CM | POA: Diagnosis present

## 2017-05-17 DIAGNOSIS — Z6841 Body Mass Index (BMI) 40.0 and over, adult: Secondary | ICD-10-CM | POA: Diagnosis not present

## 2017-05-17 DIAGNOSIS — Z66 Do not resuscitate: Secondary | ICD-10-CM | POA: Diagnosis present

## 2017-05-17 DIAGNOSIS — Z89612 Acquired absence of left leg above knee: Secondary | ICD-10-CM

## 2017-05-17 DIAGNOSIS — J9602 Acute respiratory failure with hypercapnia: Secondary | ICD-10-CM | POA: Diagnosis not present

## 2017-05-17 DIAGNOSIS — G9341 Metabolic encephalopathy: Secondary | ICD-10-CM | POA: Diagnosis present

## 2017-05-17 DIAGNOSIS — T40605A Adverse effect of unspecified narcotics, initial encounter: Secondary | ICD-10-CM | POA: Diagnosis present

## 2017-05-17 DIAGNOSIS — Z9049 Acquired absence of other specified parts of digestive tract: Secondary | ICD-10-CM

## 2017-05-17 DIAGNOSIS — M4150 Other secondary scoliosis, site unspecified: Secondary | ICD-10-CM | POA: Diagnosis present

## 2017-05-17 DIAGNOSIS — Z933 Colostomy status: Secondary | ICD-10-CM

## 2017-05-17 DIAGNOSIS — F339 Major depressive disorder, recurrent, unspecified: Secondary | ICD-10-CM | POA: Diagnosis not present

## 2017-05-17 DIAGNOSIS — R51 Headache: Secondary | ICD-10-CM | POA: Diagnosis not present

## 2017-05-17 DIAGNOSIS — Z79899 Other long term (current) drug therapy: Secondary | ICD-10-CM | POA: Diagnosis not present

## 2017-05-17 DIAGNOSIS — J9690 Respiratory failure, unspecified, unspecified whether with hypoxia or hypercapnia: Secondary | ICD-10-CM | POA: Diagnosis present

## 2017-05-17 DIAGNOSIS — K219 Gastro-esophageal reflux disease without esophagitis: Secondary | ICD-10-CM | POA: Diagnosis present

## 2017-05-17 DIAGNOSIS — N390 Urinary tract infection, site not specified: Secondary | ICD-10-CM | POA: Diagnosis not present

## 2017-05-17 DIAGNOSIS — G4730 Sleep apnea, unspecified: Secondary | ICD-10-CM | POA: Diagnosis present

## 2017-05-17 DIAGNOSIS — R739 Hyperglycemia, unspecified: Secondary | ICD-10-CM | POA: Diagnosis present

## 2017-05-17 DIAGNOSIS — Z87440 Personal history of urinary (tract) infections: Secondary | ICD-10-CM

## 2017-05-17 DIAGNOSIS — Y92129 Unspecified place in nursing home as the place of occurrence of the external cause: Secondary | ICD-10-CM

## 2017-05-17 DIAGNOSIS — E785 Hyperlipidemia, unspecified: Secondary | ICD-10-CM | POA: Diagnosis present

## 2017-05-17 DIAGNOSIS — M810 Age-related osteoporosis without current pathological fracture: Secondary | ICD-10-CM | POA: Diagnosis present

## 2017-05-17 DIAGNOSIS — R4182 Altered mental status, unspecified: Secondary | ICD-10-CM | POA: Diagnosis not present

## 2017-05-17 DIAGNOSIS — R262 Difficulty in walking, not elsewhere classified: Secondary | ICD-10-CM | POA: Diagnosis not present

## 2017-05-17 LAB — CBG MONITORING, ED: GLUCOSE-CAPILLARY: 240 mg/dL — AB (ref 65–99)

## 2017-05-17 LAB — URINALYSIS, ROUTINE W REFLEX MICROSCOPIC
Bilirubin Urine: NEGATIVE
Glucose, UA: 50 mg/dL — AB
HGB URINE DIPSTICK: NEGATIVE
KETONES UR: NEGATIVE mg/dL
Nitrite: POSITIVE — AB
PROTEIN: 30 mg/dL — AB
SQUAMOUS EPITHELIAL / LPF: NONE SEEN
Specific Gravity, Urine: 1.014 (ref 1.005–1.030)
pH: 8 (ref 5.0–8.0)

## 2017-05-17 LAB — COMPREHENSIVE METABOLIC PANEL
ALBUMIN: 3 g/dL — AB (ref 3.5–5.0)
ALT: 21 U/L (ref 14–54)
AST: 22 U/L (ref 15–41)
Alkaline Phosphatase: 128 U/L — ABNORMAL HIGH (ref 38–126)
Anion gap: 13 (ref 5–15)
BILIRUBIN TOTAL: 0.6 mg/dL (ref 0.3–1.2)
BUN: 22 mg/dL — AB (ref 6–20)
CHLORIDE: 101 mmol/L (ref 101–111)
CO2: 24 mmol/L (ref 22–32)
Calcium: 8.7 mg/dL — ABNORMAL LOW (ref 8.9–10.3)
Creatinine, Ser: 0.82 mg/dL (ref 0.44–1.00)
GFR calc Af Amer: >60 mL/min (ref >60)
GFR calc non Af Amer: >60 mL/min (ref >60)
GLUCOSE: 259 mg/dL — AB (ref 65–99)
POTASSIUM: 4.8 mmol/L (ref 3.5–5.1)
Sodium: 138 mmol/L (ref 135–145)
Total Protein: 6.4 g/dL — ABNORMAL LOW (ref 6.5–8.1)

## 2017-05-17 LAB — RAPID URINE DRUG SCREEN, HOSP PERFORMED
AMPHETAMINES: NOT DETECTED
BARBITURATES: NOT DETECTED
BENZODIAZEPINES: NOT DETECTED
COCAINE: NOT DETECTED
Opiates: NOT DETECTED
TETRAHYDROCANNABINOL: NOT DETECTED

## 2017-05-17 LAB — I-STAT TROPONIN, ED: Troponin i, poc: 0.01 ng/mL (ref 0.00–0.08)

## 2017-05-17 LAB — BLOOD GAS, ARTERIAL
Acid-Base Excess: 5.5 mmol/L — ABNORMAL HIGH (ref 0.0–2.0)
Bicarbonate: 31.5 mmol/L — ABNORMAL HIGH (ref 20.0–28.0)
Drawn by: 270271
O2 Content: 5 L/min
O2 Saturation: 89.2 %
Patient temperature: 98.6
pCO2 arterial: 65.2 mmHg (ref 32.0–48.0)
pH, Arterial: 7.306 — ABNORMAL LOW (ref 7.350–7.450)
pO2, Arterial: 59.9 mmHg — ABNORMAL LOW (ref 83.0–108.0)

## 2017-05-17 LAB — DIFFERENTIAL
BASOS ABS: 0.1 10*3/uL (ref 0.0–0.1)
BASOS PCT: 0 %
EOS ABS: 0.2 10*3/uL (ref 0.0–0.7)
Eosinophils Relative: 2 %
LYMPHS ABS: 3 10*3/uL (ref 0.7–4.0)
Lymphocytes Relative: 24 %
Monocytes Absolute: 0.5 10*3/uL (ref 0.1–1.0)
Monocytes Relative: 4 %
NEUTROS PCT: 70 %
Neutro Abs: 8.8 10*3/uL — ABNORMAL HIGH (ref 1.7–7.7)

## 2017-05-17 LAB — I-STAT CHEM 8, ED
BUN: 25 mg/dL — AB (ref 6–20)
CHLORIDE: 100 mmol/L — AB (ref 101–111)
CREATININE: 0.8 mg/dL (ref 0.44–1.00)
Calcium, Ion: 1.11 mmol/L — ABNORMAL LOW (ref 1.15–1.40)
Glucose, Bld: 261 mg/dL — ABNORMAL HIGH (ref 65–99)
HEMATOCRIT: 46 % (ref 36.0–46.0)
HEMOGLOBIN: 15.6 g/dL — AB (ref 12.0–15.0)
POTASSIUM: 4.7 mmol/L (ref 3.5–5.1)
Sodium: 139 mmol/L (ref 135–145)
TCO2: 31 mmol/L (ref 22–32)

## 2017-05-17 LAB — I-STAT ARTERIAL BLOOD GAS, ED
Acid-Base Excess: 3 mmol/L — ABNORMAL HIGH (ref 0.0–2.0)
Bicarbonate: 31.8 mmol/L — ABNORMAL HIGH (ref 20.0–28.0)
O2 SAT: 93 %
PCO2 ART: 67.1 mmHg — AB (ref 32.0–48.0)
PO2 ART: 77 mmHg — AB (ref 83.0–108.0)
TCO2: 34 mmol/L — AB (ref 22–32)
pH, Arterial: 7.284 — ABNORMAL LOW (ref 7.350–7.450)

## 2017-05-17 LAB — CBC
HCT: 46.5 % — ABNORMAL HIGH (ref 36.0–46.0)
HEMOGLOBIN: 14.2 g/dL (ref 12.0–15.0)
MCH: 30.7 pg (ref 26.0–34.0)
MCHC: 30.5 g/dL (ref 30.0–36.0)
MCV: 100.4 fL — ABNORMAL HIGH (ref 78.0–100.0)
Platelets: 180 10*3/uL (ref 150–400)
RBC: 4.63 MIL/uL (ref 3.87–5.11)
RDW: 17 % — ABNORMAL HIGH (ref 11.5–15.5)
WBC: 12.6 10*3/uL — ABNORMAL HIGH (ref 4.0–10.5)

## 2017-05-17 LAB — MRSA PCR SCREENING: MRSA BY PCR: NEGATIVE

## 2017-05-17 LAB — PROTIME-INR
INR: 1.27
Prothrombin Time: 15.8 seconds — ABNORMAL HIGH (ref 11.4–15.2)

## 2017-05-17 LAB — HEMOGLOBIN A1C
Hgb A1c MFr Bld: 10.2 % — ABNORMAL HIGH (ref 4.8–5.6)
MEAN PLASMA GLUCOSE: 246.04 mg/dL

## 2017-05-17 LAB — APTT: APTT: 30 s (ref 24–36)

## 2017-05-17 LAB — LACTIC ACID, PLASMA: LACTIC ACID, VENOUS: 1.4 mmol/L (ref 0.5–1.9)

## 2017-05-17 LAB — ETHANOL: Alcohol, Ethyl (B): <10 mg/dL (ref <10)

## 2017-05-17 MED ORDER — HYPROMELLOSE 0.3 % OP GEL: 1.0000 "application " | Freq: Every day | OPHTHALMIC | Status: DC

## 2017-05-17 MED ORDER — POLYETHYLENE GLYCOL 3350 17 G PO PACK
17.0000 g | PACK | Freq: Two times a day (BID) | ORAL | Status: DC
  Administered 2017-05-18 – 2017-05-20 (×4): 17 g via ORAL
  Filled 2017-05-17 (×4): qty 1

## 2017-05-17 MED ORDER — TRIAMCINOLONE ACETONIDE 55 MCG/ACT NA AERO
1.0000 | INHALATION_SPRAY | Freq: Every day | NASAL | Status: DC
  Filled 2017-05-17: qty 10.8

## 2017-05-17 MED ORDER — INSULIN ASPART 100 UNIT/ML ~~LOC~~ SOLN
0.0000 [IU] | Freq: Three times a day (TID) | SUBCUTANEOUS | Status: DC
  Administered 2017-05-18: 3 [IU] via SUBCUTANEOUS
  Administered 2017-05-18: 5 [IU] via SUBCUTANEOUS
  Administered 2017-05-19: 3 [IU] via SUBCUTANEOUS
  Administered 2017-05-19 (×2): 2 [IU] via SUBCUTANEOUS

## 2017-05-17 MED ORDER — VITAMIN D 1000 UNITS PO TABS
1000.0000 [IU] | ORAL_TABLET | ORAL | Status: DC
  Administered 2017-05-19: 1000 [IU] via ORAL
  Filled 2017-05-17: qty 1

## 2017-05-17 MED ORDER — VENLAFAXINE HCL ER 75 MG PO CP24
75.0000 mg | ORAL_CAPSULE | Freq: Every day | ORAL | Status: DC
  Administered 2017-05-18 – 2017-05-20 (×3): 75 mg via ORAL
  Filled 2017-05-17 (×3): qty 1

## 2017-05-17 MED ORDER — ASPIRIN EC 81 MG PO TBEC
81.0000 mg | DELAYED_RELEASE_TABLET | Freq: Every day | ORAL | Status: DC
  Administered 2017-05-18 – 2017-05-20 (×3): 81 mg via ORAL
  Filled 2017-05-17 (×3): qty 1

## 2017-05-17 MED ORDER — LEVOFLOXACIN IN D5W 750 MG/150ML IV SOLN: 750.0000 mg | Freq: Once | INTRAVENOUS | Status: DC

## 2017-05-17 MED ORDER — ARTIFICIAL TEARS OPHTHALMIC OINT
TOPICAL_OINTMENT | Freq: Every day | OPHTHALMIC | Status: DC
  Administered 2017-05-17: 23:00:00 via OPHTHALMIC
  Filled 2017-05-17: qty 3.5

## 2017-05-17 MED ORDER — MOMETASONE FURO-FORMOTEROL FUM 100-5 MCG/ACT IN AERO
2.0000 | INHALATION_SPRAY | Freq: Two times a day (BID) | RESPIRATORY_TRACT | Status: DC
  Administered 2017-05-18 – 2017-05-20 (×3): 2 via RESPIRATORY_TRACT
  Filled 2017-05-17: qty 8.8

## 2017-05-17 MED ORDER — SODIUM CHLORIDE 0.9 % IV SOLN
INTRAVENOUS | Status: DC
  Administered 2017-05-17: 19:00:00 via INTRAVENOUS
  Administered 2017-05-18: 1000 mL via INTRAVENOUS
  Administered 2017-05-18 – 2017-05-19 (×2): via INTRAVENOUS

## 2017-05-17 MED ORDER — TIOTROPIUM BROMIDE MONOHYDRATE 18 MCG IN CAPS
18.0000 ug | ORAL_CAPSULE | Freq: Every day | RESPIRATORY_TRACT | Status: DC
  Administered 2017-05-20: 18 ug via RESPIRATORY_TRACT
  Filled 2017-05-17: qty 5

## 2017-05-17 MED ORDER — CLOBETASOL PROPIONATE 0.05 % EX CREA: 1.0000 "application " | TOPICAL_CREAM | Freq: Two times a day (BID) | CUTANEOUS | Status: DC | PRN

## 2017-05-17 MED ORDER — PERPHENAZINE 2 MG PO TABS
2.0000 mg | ORAL_TABLET | Freq: Three times a day (TID) | ORAL | Status: DC
  Administered 2017-05-18 – 2017-05-20 (×6): 2 mg via ORAL
  Filled 2017-05-17 (×11): qty 1

## 2017-05-17 MED ORDER — ENOXAPARIN SODIUM 40 MG/0.4ML ~~LOC~~ SOLN
40.0000 mg | SUBCUTANEOUS | Status: DC
  Administered 2017-05-17 – 2017-05-19 (×3): 40 mg via SUBCUTANEOUS
  Filled 2017-05-17 (×3): qty 0.4

## 2017-05-17 MED ORDER — SODIUM CHLORIDE 0.9 % IV SOLN: 2.0000 g | Freq: Once | INTRAVENOUS | Status: DC

## 2017-05-17 MED ORDER — SODIUM CHLORIDE 0.9 % IV SOLN
1.0000 g | Freq: Every day | INTRAVENOUS | Status: DC
  Administered 2017-05-17 – 2017-05-19 (×3): 1 g via INTRAVENOUS
  Filled 2017-05-17: qty 10
  Filled 2017-05-17: qty 1
  Filled 2017-05-17 (×2): qty 10

## 2017-05-17 NOTE — ED Notes (Signed)
[facility]-(Code Stroke activate)-called @ 1036-per Dr. Jonette PesaZackowski-called by Marylene LandAngela

## 2017-05-17 NOTE — Progress Notes (Signed)
Placed patient on BIPAP per ABG results. RN aware.

## 2017-05-17 NOTE — Consult Note (Addendum)
Requesting Physician: Dr. Deretha Emory    Chief Complaint: Altered mental status called as a code stroke  History obtained from: EMS and nurse from nursing home  HPI:                                                                                                                                         Deborah Young is an 72 y.o. female who resides at a nursing home is recently diagnosed with UTI and schizophrenia.  Patient was last seen normal 2 days ago.  Patient was found this morning confused, took her colostomy bag off and was throwing feces throughout the room.  On arrival patient appears extremely lethargic, able to answer questions but again it takes her quite a long time to answer the questions.  She is aware that the month is March and is able to tell her name and her age.  She is diffusely weak throughout but able to attempt what we are asking her.  Initially EMS that she had a right facial droop however this likely was positional as when her head was split midline and asked to smile she had a symmetrical smile.  Date last known well: Date: 05/15/2017 Time last known well: Unable to determine tPA Given: No: Last seen normal Sunday Modified Rankin: Rankin Score=5 NIH stroke scale of 10  Past Medical History:  Diagnosis Date  . Acquired absence of ankle, left (HCC)   . Atherosclerotic heart disease of native coronary artery without angina pectoris   . Chronic pain   . Constipation   . COPD (chronic obstructive pulmonary disease) (HCC)   . Dementia    per H&P from nursing home  . Dizziness   . Dysthymic disorder   . Edema   . Generalized muscle weakness   . GERD (gastroesophageal reflux disease)   . H/O small bowel obstruction    per H&P from Nursing home  . History of kidney stones    UPJ stone per H&P from nursing home  . History of spina bifida    per H&P from nursing home  . History of urinary tract infection   . Hyperlipidemia   . Hypertension   . Insomnia   . Mood  disorder (HCC)   . Obesity   . Osteoporosis   . Paralytic syndrome (HCC)   . Polyneuropathy   . Secondary scoliosis   . Sleep apnea    CPAP    Past Surgical History:  Procedure Laterality Date  . COLON SURGERY    . COLOSTOMY    . HOLMIUM LASER APPLICATION Left 11/05/2016   Procedure: HOLMIUM LASER APPLICATION;  Surgeon: Sebastian Ache, MD;  Location: [facility];  Service: Urology;  Laterality: Left;  . IR NEPHROSTOMY PLACEMENT LEFT  09/14/2016  . LEFT ANKLE AMPUTATION    . NEPHROLITHOTOMY Left 11/05/2016   Procedure: NEPHROLITHOTOMY LEFT PERCUTANEOUS;  Surgeon: Sebastian Ache, MD;  Location: [facility];  Service: Urology;  Laterality: Left;  . right foot multiple toe amputations     . ROBOT ASSISTED LAPAROSCOPIC COMPLETE CYSTECT ILEAL CONDUIT    . URETEROSCOPY Left 11/05/2016   Procedure: ANTEGRADE URETEROSCOPY;  Surgeon: Sebastian Ache, MD;  Location: [facility];  Service: Urology;  Laterality: Left;    No family history on file. Social History:  reports that she has quit smoking. Her smoking use included cigarettes. she has never used smokeless tobacco. She reports that she does not drink alcohol or use drugs.  Allergies:  Allergies  Allergen Reactions  . Codeine   . Erythromycin Other (See Comments)    Per MAR  . Penicillins Other (See Comments)    Per MAR  Has patient had a PCN reaction causing immediate rash, facial/tongue/throat swelling, SOB or lightheadedness with hypotension: Unknown Has patient had a PCN reaction causing severe rash involving mucus membranes or skin necrosis: Unknown Has patient had a PCN reaction that required hospitalization: Unknown Has patient had a PCN reaction occurring within the last 10 years: Unknown If all of the above answers are "NO", then may proceed with Cephalosporin use.    Medications:                                                                                                                           No current facility-administered  medications for this encounter.    Current Outpatient Medications  Medication Sig Dispense Refill  . aspirin EC 81 MG tablet Take 81 mg by mouth daily.    . bisacodyl (DULCOLAX) 5 MG EC tablet Take 10 mg by mouth daily as needed for moderate constipation. For 72 hours as needed    . budesonide-formoterol (SYMBICORT) 80-4.5 MCG/ACT inhaler Inhale 2 puffs into the lungs 2 (two) times daily.    . cefTRIAXone (ROCEPHIN) 2 g injection Inject 2 g into the muscle daily. For 10 days, mix with 4.2 ml of lidocaine    . cholecalciferol (VITAMIN D) 1000 units tablet Take 1,000 Units by mouth every Tuesday.    . Glycerin-Polysorbate 80 (REFRESH DRY EYE THERAPY OP) Apply 1 drop to eye 4 (four) times daily.    Marland Kitchen HYDROcodone-acetaminophen (NORCO) 7.5-325 MG tablet Take 1 tablet by mouth 3 (three) times daily. (Patient taking differently: Take 1 tablet by mouth 2 (two) times daily. May take an additional 1 tablet every 4 hours as needed for pain) 10 tablet 0  . hypromellose (SYSTANE OVERNIGHT THERAPY) 0.3 % GEL ophthalmic ointment Place 1 application into both eyes at bedtime.    . lactobacillus acidophilus (BACID) TABS tablet Take 1 tablet by mouth 2 (two) times daily. For 10 days    . lamoTRIgine (LAMICTAL) 100 MG tablet Take 100 mg by mouth daily.    . nitrofurantoin, macrocrystal-monohydrate, (MACROBID) 100 MG capsule Take 1 capsule (100 mg total) by mouth 2 (two) times daily. Start 3 days before next Urology appointment. 6 capsule 0  . polyethylene glycol (  MIRALAX / GLYCOLAX) packet Take 17 g by mouth 3 (three) times daily. May take an additional 17 g as needed for constipation    . pregabalin (LYRICA) 100 MG capsule Take 100 mg by mouth 2 (two) times daily.    . pregabalin (LYRICA) 150 MG capsule Take 150 mg by mouth daily at 3 pm.     . primidone (MYSOLINE) 50 MG tablet Take 50 mg by mouth 2 (two) times daily with breakfast and lunch.     . tiotropium (SPIRIVA) 18 MCG inhalation capsule Place 18 mcg into  inhaler and inhale daily.    Marland Kitchen triamcinolone (NASACORT ALLERGY 24HR) 55 MCG/ACT AERO nasal inhaler Place 1 spray into the nose daily.    Marland Kitchen venlafaxine XR (EFFEXOR-XR) 150 MG 24 hr capsule Take 150 mg by mouth daily with breakfast.    . venlafaxine XR (EFFEXOR-XR) 75 MG 24 hr capsule Take 75 mg by mouth daily with breakfast.       ROS:                                                                                                                                       History obtained from unobtainable from patient due to mental status    General Examination:                                                                                                      There were no vitals taken for this visit.  HEENT-  Normocephalic, no lesions, without obvious abnormality.  Normal external eye and conjunctiva.   Cardiovascular- S1-S2 audible, pulses palpable throughout   Lungs-no rhonchi or wheezing noted, no excessive working breathing.  Saturations within normal limits Abdomen- All 4 quadrants palpated and nontender with colostomy bag on the left side Extremities- Warm, dry and intact, below-knee amputation Musculoskeletal-no joint tenderness, deformity or swelling Skin-warm and dry, no hyperpigmentation, vitiligo, or suspicious lesions  Neurological Examination Mental Status: Alert to her age and month,   hypophonic but not appear to have any aphasia.  Difficulty following commands secondary to both being very drowsy and generalized weakness  cranial Nerves: II: Visual fields grossly normal,  III,IV, VI: ptosis not present, extra-ocular motions intact bilaterally, pupils equal, round, reactive to light and accommodation V,VII: smile symmetric--tends to lean to the right which appears to be a facial droop but however when placed vertically there is no facial droop., facial light touch sensation normal bilaterally VIII: hearing normal bilaterally IX,X: uvula rises  symmetrically XI: bilateral  shoulder shrug XII: midline tongue extension Motor: Bilateral upper extremities 2/5, bilateral lower extremities the same with notable external rotation of the right leg and below-knee amputation of the left leg both are chronic Sensory: Pinprick and light touch intact throughout, bilaterally Deep Tendon Reflexes: Diminished throughout Plantars: Right: Mute    Cerebellar: Not able to do finger to nose or heel to shin secondary to generalized weakness Gait: Not tested   Lab Results: Basic Metabolic Panel: Recent Labs  Lab 05/17/17 1051  NA 139  K 4.7  CL 100*  GLUCOSE 261*  BUN 25*  CREATININE 0.80    CBC: Recent Labs  Lab 05/17/17 1045 05/17/17 1051  WBC 12.6*  --   NEUTROABS 8.8*  --   HGB 14.2 15.6*  HCT 46.5* 46.0  MCV 100.4*  --   PLT 180  --     Lipid Panel: No results for input(s): CHOL, TRIG, HDL, CHOLHDL, VLDL, LDLCALC in the last 168 hours.  CBG: Recent Labs  Lab 05/17/17 1036  GLUCAP 240*    Imaging: Ct Head Code Stroke Wo Contrast  Result Date: 05/17/2017 CLINICAL DATA:  Code stroke. 72 year old female with facial droop and slurred speech. Last seen normal 0700 hours. EXAM: CT HEAD WITHOUT CONTRAST TECHNIQUE: Contiguous axial images were obtained from the base of the skull through the vertex without intravenous contrast. COMPARISON:  04/06/2017 head CT, 06/04/2008 FINDINGS: Brain: Stable cerebral volume. Chronic ventriculomegaly, mildly progressed since 2010. No transependymal edema suspected. No cortically based acute infarct identified. No acute intracranial hemorrhage identified. Stable gray-white matter differentiation throughout the brain. Vascular: Mild Calcified atherosclerosis at the skull base. No suspicious intracranial vascular hyperdensity. Skull: No acute osseous abnormality identified. Sinuses/Orbits: Visualized paranasal sinuses and mastoids are stable and well pneumatized. Other: No acute orbit or scalp soft tissue findings. ASPECTS  (Alberta Stroke Program Early CT Score) - Ganglionic level infarction (caudate, lentiform nuclei, internal capsule, insula, M1-M3 cortex): 7 - Supraganglionic infarction (M4-M6 cortex): 3 Total score (0-10 with 10 being normal): 10 IMPRESSION: 1. Stable noncontrast CT appearance of the brain since 04/06/2017. 2. ASPECTS is 10. 3. These results were communicated to Dr. Amada JupiterKirkpatrick at 10:58 amon 3/5/2019by text page via the [facility] messaging system. Electronically Signed   By: Odessa FlemingH  Hall M.D.   On: 05/17/2017 10:58    Assessment and plan discussed with with attending physician and they are in agreement.    Felicie MornDavid Smith PA-C [facility] [phone]  05/17/2017, 11:06 AM   Assessment: 72 y.o. female brought to hospital for increasing lethargy, altered mental status.  Code stroke was called due to slurred speech and?  Facial droop.  The appearance of a facial droop appeared to be positional, with resolution once she turned her head.  I suspect metabolic encephalopathy due to hypercarbia.   Stroke Risk Factors - hyperlipidemia and hypertension  Recommend 1) treatment per internal medicine, if she continues to have mental status changes once her hypercarbia is addressed,  Then please call for further recommendations.

## 2017-05-17 NOTE — H&P (Addendum)
History and Physical    Deborah Young ZOX:[id number] DOB: [birth year] DOA: 05/17/2017  PCP: Nadara Eaton, MD Patient coming from: Nursing home  Chief Complaint: Hallucination and change in mental status  HPI: Deborah Young is a 72 y.o. female with medical history significant of spina bifida, dementia, schizophrenia, history of kidney stones, recurrent urinary tract infection, mood disorder, insomnia, polyneuropathy, single kidney, sleep apnea admitted with complaints of change in mental status and hallucination.  History is obtained from patient's records as well as patient's sister.  According to the sister who sees her 3 times a week reports that she has been having a change in her condition for the last 1 week.  Patient was found this morning confused she took her colostomy bag off and was throwing pieces throughout the room.  No documented fever nausea vomiting diarrhea.  No cough.  Patient has been having her psych medications changed or increased or altered in the last few days  Which possibly contributed to her change in mental status.  Family also reports that her p.o. intake has been diminished since the last 2 days.  According to the EMS patient had a right facial droop and was not moving her arms or legs as normal.  At baseline she is bedridden or in wheelchair. code stroke was called when patient first came to the ER he she was seen by neurology.   ED Course: ABG that was done showed a pH of 7.28, CO2 67, O2 77.  She was placed on BiPAP.  Her mental mental status partly improved after placing her on BiPAP.  Patient does use a CPAP at home.  Troponin was 0.01 lactic acid level was 1.4, white count was 12.6, hemoglobin 15.6, platelet count 180, UA showed positive nitrates.Urine drug screen was negative.  Her glucose was elevated at 259 and 261.  She does not carry history of diabetes.  X-ray was clear.  Review of Systems: As per HPI otherwise all other systems reviewed and are  negative  Ambulatory Status she lives in a nursing home.  She has a history of spina bifida and she is bedridden or wheelchair-bound at all times.  Past Medical History:  Diagnosis Date  . Acquired absence of ankle, left (HCC)   . Atherosclerotic heart disease of native coronary artery without angina pectoris   . Chronic pain   . Constipation   . COPD (chronic obstructive pulmonary disease) (HCC)   . Dementia    per H&P from nursing home  . Dizziness   . Dysthymic disorder   . Edema   . Generalized muscle weakness   . GERD (gastroesophageal reflux disease)   . H/O small bowel obstruction    per H&P from Nursing home  . History of kidney stones    UPJ stone per H&P from nursing home  . History of spina bifida    per H&P from nursing home  . History of urinary tract infection   . Hyperlipidemia   . Hypertension   . Insomnia   . Mood disorder (HCC)   . Obesity   . Osteoporosis   . Paralytic syndrome (HCC)   . Polyneuropathy   . Secondary scoliosis   . Sleep apnea    CPAP    Past Surgical History:  Procedure Laterality Date  . COLON SURGERY    . COLOSTOMY    . HOLMIUM LASER APPLICATION Left 11/05/2016   Procedure: HOLMIUM LASER APPLICATION;  Surgeon: Sebastian Ache, MD;  Location: [facility];  Service: Urology;  Laterality: Left;  . IR NEPHROSTOMY PLACEMENT LEFT  09/14/2016  . LEFT ANKLE AMPUTATION    . NEPHROLITHOTOMY Left 11/05/2016   Procedure: NEPHROLITHOTOMY LEFT PERCUTANEOUS;  Surgeon: Sebastian Ache, MD;  Location: [facility];  Service: Urology;  Laterality: Left;  . right foot multiple toe amputations     . ROBOT ASSISTED LAPAROSCOPIC COMPLETE CYSTECT ILEAL CONDUIT    . URETEROSCOPY Left 11/05/2016   Procedure: ANTEGRADE URETEROSCOPY;  Surgeon: Sebastian Ache, MD;  Location: [facility];  Service: Urology;  Laterality: Left;    Social History   Socioeconomic History  . Marital status: Single    Spouse name: Not on file  . Number of children: Not on file  . Years of  education: Not on file  . Highest education level: Not on file  Social Needs  . Financial resource strain: Not on file  . Food insecurity - worry: Not on file  . Food insecurity - inability: Not on file  . Transportation needs - medical: Not on file  . Transportation needs - non-medical: Not on file  Occupational History  . Not on file  Tobacco Use  . Smoking status: Former Smoker    Types: Cigarettes  . Smokeless tobacco: Never Used  Substance and Sexual Activity  . Alcohol use: No  . Drug use: No  . Sexual activity: No  Other Topics Concern  . Not on file  Social History Narrative  . Not on file    Allergies  Allergen Reactions  . Codeine     Not listed on MAR  . Erythromycin Other (See Comments)    Per MAR  . Penicillins Other (See Comments)    Per MAR  Has patient had a PCN reaction causing immediate rash, facial/tongue/throat swelling, SOB or lightheadedness with hypotension: Unknown Has patient had a PCN reaction causing severe rash involving mucus membranes or skin necrosis: Unknown Has patient had a PCN reaction that required hospitalization: Unknown Has patient had a PCN reaction occurring within the last 10 years: Unknown If all of the above answers are "NO", then may proceed with Cephalosporin use.    No family history on file.   Prior to Admission medications   Medication Sig Start Date End Date Taking? Authorizing Provider  aspirin EC 81 MG tablet Take 81 mg by mouth daily.   Yes [provider]  budesonide-formoterol (SYMBICORT) 80-4.5 MCG/ACT inhaler Inhale 2 puffs into the lungs 2 (two) times daily.   Yes [provider]  cholecalciferol (VITAMIN D) 1000 units tablet Take 1,000 Units by mouth every Thursday.    Yes [provider]  clobetasol (TEMOVATE) 0.05 % external solution Apply 1 application topically 2 (two) times daily as needed (dry patches).   Yes [provider]  HYDROcodone-acetaminophen (NORCO/VICODIN)  5-325 MG tablet Take 1 tablet by mouth daily as needed for moderate pain.   Yes [provider]  hypromellose (SYSTANE OVERNIGHT THERAPY) 0.3 % GEL ophthalmic ointment Place 1 application into both eyes at bedtime.   Yes [provider]  mirtazapine (REMERON) 15 MG tablet Take 15 mg by mouth at bedtime.   Yes [provider]  perphenazine (TRILAFON) 2 MG tablet Take 2 mg by mouth 3 (three) times daily.   Yes [provider]  polyethylene glycol (MIRALAX / GLYCOLAX) packet Take 17 g by mouth 2 (two) times daily. May take an additional 17 g as needed for constipation   Yes [provider]  pregabalin (LYRICA) 100 MG capsule  Take 100 mg by mouth 2 (two) times daily.   Yes [provider]  pregabalin (LYRICA) 150 MG capsule Take 150 mg by mouth daily at 12 noon.    Yes [provider]  tiotropium (SPIRIVA) 18 MCG inhalation capsule Place 18 mcg into inhaler and inhale daily.   Yes [provider]  triamcinolone (NASACORT ALLERGY 24HR) 55 MCG/ACT AERO nasal inhaler Place 1 spray into the nose daily.   Yes [provider]  venlafaxine XR (EFFEXOR-XR) 75 MG 24 hr capsule Take 75 mg by mouth daily with breakfast.   Yes [provider]  HYDROcodone-acetaminophen (NORCO) 7.5-325 MG tablet Take 1 tablet by mouth 3 (three) times daily. Patient not taking: Reported on 05/17/2017 09/20/16   Arrien, York Ram, MD    Physical Exam: Vitals:   05/17/17 1500 05/17/17 1515 05/17/17 1530 05/17/17 1603  BP: (!) 117/96 120/72 119/75 104/60  Pulse: 82 83 80 81  Resp: 13 16 15 17   Temp:      TempSrc:      SpO2: 96% 95% 96% 100%  Weight:         GeneralAppears calm and comfortable Eyes: PERRL, EOMI, normal lids, iris [id number] normal hearing, lips & tongue, mmm Neck:  no LAD, masses or thyromegaly Cardiovascular RRR, no m/r/g. No LE edema.  Respiratory:  CTA bilaterally, no w/r/r. Normal respiratory effort. Abdomen:   soft, ntnd, NABS.  Colostomy on the left side of the abdomen and nephrostomy on the right side of the abdomen abdomen is distended and nontender. Skin:  no rash or induration seen on limited exam Musculoskeletal:  grossly normal tone BUE/BLE, good ROM, no bony abnormality Psychiatric:  grossly normal mood and affect, speech fluent and appropriate, AOx3 Neurologic:  CN 2-12 grossly intact, moves all extremities in coordinated fashion, sensation intact  Labs on Admission: I have personally reviewed following labs and imaging studies  CBC: Recent Labs  Lab 05/17/17 1045 05/17/17 1051  WBC 12.6*  --   NEUTROABS 8.8*  --   HGB 14.2 15.6*  HCT 46.5* 46.0  MCV 100.4*  --   PLT 180  --    Basic Metabolic Panel: Recent Labs  Lab 05/17/17 1045 05/17/17 1051  NA 138 139  K 4.8 4.7  CL 101 100*  CO2 24  --   GLUCOSE 259* 261*  BUN 22* 25*  CREATININE 0.82 0.80  CALCIUM 8.7*  --    GFR: CrCl cannot be calculated (Unknown ideal weight.). Liver Function Tests: Recent Labs  Lab 05/17/17 1045  AST 22  ALT 21  ALKPHOS 128*  BILITOT 0.6  PROT 6.4*  ALBUMIN 3.0*   No results for input(s): LIPASE, AMYLASE in the last 168 hours. No results for input(s): AMMONIA in the last 168 hours. Coagulation Profile: Recent Labs  Lab 05/17/17 1045  INR 1.27   Cardiac Enzymes: No results for input(s): CKTOTAL, CKMB, CKMBINDEX, TROPONINI in the last 168 hours. BNP (last 3 results) No results for input(s): PROBNP in the last 8760 hours. HbA1C: No results for input(s): HGBA1C in the last 72 hours. CBG: Recent Labs  Lab 05/17/17 1036  GLUCAP 240*   Lipid Profile: No results for input(s): CHOL, HDL, LDLCALC, TRIG, CHOLHDL, LDLDIRECT in the last 72 hours. Thyroid Function Tests: No results for input(s): TSH, T4TOTAL, FREET4, T3FREE, THYROIDAB in the last 72 hours. Anemia Panel: No results for input(s): VITAMINB12, FOLATE, FERRITIN, TIBC, IRON, RETICCTPCT in the last 72 hours. Urine  analysis:    Component Value  Date/Time   COLORURINE YELLOW 05/17/2017 1035   APPEARANCEUR CLOUDY (A) 05/17/2017 1035   LABSPEC 1.014 05/17/2017 1035   PHURINE 8.0 05/17/2017 1035   GLUCOSEU 50 (A) 05/17/2017 1035   HGBUR NEGATIVE 05/17/2017 1035   BILIRUBINUR NEGATIVE 05/17/2017 1035   KETONESUR NEGATIVE 05/17/2017 1035   PROTEINUR 30 (A) 05/17/2017 1035   UROBILINOGEN 0.2 02/27/2008 1331   NITRITE POSITIVE (A) 05/17/2017 1035   LEUKOCYTESUR LARGE (A) 05/17/2017 1035    Creatinine Clearance: CrCl cannot be calculated (Unknown ideal weight.).  Sepsis Labs: @LABRCNTIP (procalcitonin:4,lacticidven:4) )No results found for this or any previous visit (from the past 240 hour(s)).   Radiological Exams on Admission: Dg Chest Port 1 View  Result Date: 05/17/2017 CLINICAL DATA:  Hypoxia, code stroke EXAM: PORTABLE CHEST 1 VIEW COMPARISON:  Portable chest x-ray of 09/14/2016 FINDINGS: The lungs are not optimally aerated with mild volume loss at the lung bases. Minimally prominent markings are present at the left lung base most consistent with atelectasis. No definite pneumonia or pleural effusion is seen. Mediastinal and hilar contours are stable and mild cardiomegaly is stable. Thoracolumbar scoliosis again is noted. IMPRESSION: 1. Suboptimal inspiration with mild bibasilar volume loss. No definite pneumonia or pleural effusion. 2. Thoracolumbar scoliosis. Electronically Signed   By: Dwyane DeePaul  Barry M.D.   On: 05/17/2017 11:21   Ct Renal Stone Study  Result Date: 05/17/2017 CLINICAL DATA:  History of kidney stones, cost a me and urostomy backs EXAM: CT ABDOMEN AND PELVIS WITHOUT CONTRAST TECHNIQUE: Multidetector CT imaging of the abdomen and pelvis was performed following the standard protocol without IV contrast. COMPARISON:  CT abdomen pelvis of 09/13/2016 FINDINGS: Lower chest: Bibasilar dependent atelectasis has increased. Pneumonia cannot be excluded. No definite pleural effusion is seen. The  heart is unchanged in size and no pericardial effusion is seen. Hepatobiliary: Again there is severe fatty infiltration throughout the liver. No ductal dilatation is seen. Surgical clips are present from prior cholecystectomy. Pancreas: The pancreas is normal in size and the pancreatic duct is not dilated. Spleen: The spleen is minimally prominent but unremarkable. Adrenals/Urinary Tract: The adrenal glands are stable. Atrophic appearing right kidney is present with no change in right renal calculi. Compared stress hypertrophy of the left kidney is again noted with small left renal calculi present which are nonobstructing. The lobular appearance of the upper pole of the left kidney appears stable and compared to the prior study with IV contrast appears to represent normal renal parenchyma which is lobular. The ureters are not dilated. Diverting right lower quadrant urostomy is again noted, and there is little change in the appearance of the parastomal hernia containing a nondistended loop of small bowel and omental fat. The urinary bladder has previously been resected. Stomach/Bowel: The stomach is completely decompressed and cannot be evaluated. No small bowel distention is currently seen. Colostomy is noted left lower quadrant half with resection of a portion of the rectosigmoid colon previously. There is feces throughout the colon. Appendix and terminal ileum are not visible but the distal ileum is unremarkable. Vascular/Lymphatic: Abdominal aorta is normal in caliber with moderate abdominal aortic atherosclerosis present. No adenopathy is seen. Reproductive: The uterus is normal in size. No adnexal lesion is seen. No fluid is noted within the pelvis. Other: Midline ventral hernia at the umbilicus containing only fat. Musculoskeletal: Thoracolumbar scoliosis again is noted with degenerative change. 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 IMPRESSION: 1. Bilateral kidney stones with atrophy of the right  kidney and compensatory hypertrophy of the  left kidney. No hydronephrosis is seen and diverting ureterostomy is unremarkable. 2. 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 3. Severe fatty infiltration diffusely throughout the liver. 4. Colostomy in left lower quadrant. 5. Probable bibasilar atelectasis. Difficult to exclude pneumonia. Correlate clinically. Electronically Signed   By: Dwyane Dee M.D.   On: 05/17/2017 14:47   Ct Head Code Stroke Wo Contrast  Result Date: 05/17/2017 CLINICAL DATA:  Code stroke. 72 year old female with facial droop and slurred speech. Last seen normal 0700 hours. EXAM: CT HEAD WITHOUT CONTRAST TECHNIQUE: Contiguous axial images were obtained from the base of the skull through the vertex without intravenous contrast. COMPARISON:  04/06/2017 head CT, 06/04/2008 FINDINGS: Brain: Stable cerebral volume. Chronic ventriculomegaly, mildly progressed since 2010. No transependymal edema suspected. No cortically based acute infarct identified. No acute intracranial hemorrhage identified. Stable gray-white matter differentiation throughout the brain. Vascular: Mild Calcified atherosclerosis at the skull base. No suspicious intracranial vascular hyperdensity. Skull: No acute osseous abnormality identified. Sinuses/Orbits: Visualized paranasal sinuses and mastoids are stable and well pneumatized. Other: No acute orbit or scalp soft tissue findings. ASPECTS ([place]berta Stroke Program Early CT Score) - Ganglionic level infarction (caudate, lentiform nuclei, internal capsule, insula, M1-M3 cortex): 7 - Supraganglionic infarction (M4-M6 cortex): 3 Total score (0-10 with 10 being normal): 10 IMPRESSION: 1. Stable noncontrast CT appearance of the brain since 04/06/2017. 2. ASPECTS is 10. 3. These results were communicated to Dr. Amada Jupiter at 10:58 amon 3/5/2019by text page via the [facility] messaging system. Electronically Signed    By: Odessa Fleming M.D.   On: 05/17/2017 10:58    EKG: Independently reviewed.  Assessment/Plan Active Problems:   Respiratory failure (HCC)  1] change in mental status due to- hypercarbic respiratory failure-precipitated by probably from too many narcotics and psych meds.  I will hold her Norco, Lyrica, Remeron.  Will restart perphenazine which is an antipsychotic and Effexor.  Patient has a history of COPD and is a ex smoker.  Follow-up ABG.  Her chest x-ray was clear.  Follow urine culture.  Restart Symbicort.  She will be kept NPO just because she has a BiPAP on.  When she is off the BiPAP advance diet.  2] history of spina bifida and  diverting ileostomy with history of recurrent UTI- UA shows positive nitrites urine culture has been sent.  Patient received Rocephin 1 dose in the ER.  I will not be putting her on any antibiotics at this time follow urine culture.  Follow blood culture.  3] patient with single kidney-she was born with single kidney.  Monitor renal functions avoid nephrotoxic drugs.  4] hyperglycemia patient does not have a history of diabetes.  We will place her on sliding scale insulin and fingersticks before meals and at bedtime.  I would also check her hemoglobin A1c.      DVT prophylaxis: Lovenox Code Status: DO NOT RESUSCITATE Family Communication discussed with sister :Disposition hopefully she can be discharged back to the nursing home in 2-3 days. Consults called: ER called neurology for code stroke but patient did not have a stroke.  Admission status: Inpatient.  Alwyn Ren MD [facility]  If 7PM-7AM, please contact night-coverage [email or web address] Password [facility]  05/17/2017, 4:49 PM

## 2017-05-17 NOTE — Code Documentation (Signed)
72[facility] [id number].  Patient from facility where she was recently diagnosed with UTI and schizophrenia. Patient was noted to have confusion on Sunday and then noted to have right facial droop and slurred speech at 0700 this morning.  Staff also noted patient was having hallucinations. Patient presented to the ED where a code stroke was activated.  Stroke team to the bedside.  Patient transported to CT. CT completed.  NIHSS 10, see documentation for details and code stroke times.  Patient lethargic with generalized weakness and mild dysarthria.  Of note, patient's mouth is dry and patient reported to have decreased O2 sats on RA requiring oxygen via nasal cannula.  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 Patient is outside the window for tPA.  Patient is not a candidate for acute stroke treatment.  Bedside handoff with ED RN Tresa EndoKelly.

## 2017-05-17 NOTE — ED Provider Notes (Signed)
[facility] EMERGENCY DEPARTMENT Provider Note   CSN: [id number] Arrival date & time: 05/17/17  1023   An emergency department physician performed an initial assessment on this suspected stroke patient at 1050(Deborah Young).  History   Chief Complaint Chief Complaint  Patient presents with  . Weakness    HPI Deborah Young is a 72 y.o. female.  Brought in by nursing home from nursing facility.  Patient reportedly had an acute mental status change and right facial droop and complaint of headache sometime after 7 this morning.  Last seen normal at 7.  Patient noted to be a little hypoxic on the way in and required some nasal cannula oxygen.  Code stroke not activated in route understandable because it did seem like may be that was more of an altered mental status and stroke.  However upon arrival with patient having history of spina bifida in the right facial droop which apparently improved on the way in but still seemed a little present complaint of headache and bilateral upper extremity weakness code stroke was activated because patient was within the time window.      Past Medical History:  Diagnosis Date  . Acquired absence of ankle, left ([place])   . Atherosclerotic heart disease of native coronary artery without angina pectoris   . Chronic pain   . Constipation   . COPD (chronic obstructive pulmonary disease) ([place])   . Dementia    per H&P from nursing home  . Dizziness   . Dysthymic disorder   . Edema   . Generalized muscle weakness   . GERD (gastroesophageal reflux disease)   . H/O small bowel obstruction    per H&P from Nursing home  . History of kidney stones    UPJ stone per H&P from nursing home  . History of spina bifida    per H&P from nursing home  . History of urinary tract infection   . Hyperlipidemia   . Hypertension   . Insomnia   . Mood disorder ([place])   . Obesity   . Osteoporosis   . Paralytic syndrome ([place])   . Polyneuropathy   .  Secondary scoliosis   . Sleep apnea    CPAP    Patient Active Problem List   Diagnosis Date Noted  . SBO (small bowel obstruction) ([place]) 09/13/2016  . DYSLIPIDEMIA 01/20/2010  . Essential hypertension, benign 01/20/2010  . SHORTNESS OF BREATH 01/20/2010  . CHEST PAIN 01/20/2010  . CAD (coronary artery disease) 01/16/2010  . COPD 01/16/2010  . GERD 01/16/2010    Past Surgical History:  Procedure Laterality Date  . COLON SURGERY    . COLOSTOMY    . HOLMIUM LASER APPLICATION Left 11/16/8331   Procedure: HOLMIUM LASER APPLICATION;  Surgeon: Alexis Frock, MD;  Location: [facility];  Service: Urology;  Laterality: Left;  . IR NEPHROSTOMY PLACEMENT LEFT  09/14/2016  . LEFT ANKLE AMPUTATION    . NEPHROLITHOTOMY Left 11/05/2016   Procedure: NEPHROLITHOTOMY LEFT PERCUTANEOUS;  Surgeon: Alexis Frock, MD;  Location: [facility];  Service: Urology;  Laterality: Left;  . right foot multiple toe amputations     . ROBOT ASSISTED LAPAROSCOPIC COMPLETE CYSTECT ILEAL CONDUIT    . URETEROSCOPY Left 11/05/2016   Procedure: ANTEGRADE URETEROSCOPY;  Surgeon: Alexis Frock, MD;  Location: [facility];  Service: Urology;  Laterality: Left;    OB History    No data available       Home Medications    Prior to Admission medications  Medication Sig Start Date End Date Taking? Authorizing Provider  aspirin EC 81 MG tablet Take 81 mg by mouth daily.   Yes [provider]  budesonide-formoterol (SYMBICORT) 80-4.5 MCG/ACT inhaler Inhale 2 puffs into the lungs 2 (two) times daily.   Yes [provider]  cholecalciferol (VITAMIN D) 1000 units tablet Take 1,000 Units by mouth every Thursday.    Yes [provider]  clobetasol (TEMOVATE) 0.05 % external solution Apply 1 application topically 2 (two) times daily as needed (dry patches).   Yes [provider]  HYDROcodone-acetaminophen (NORCO/VICODIN) 5-325 MG tablet Take 1 tablet by mouth daily as needed for moderate pain.   Yes  [provider]  hypromellose (SYSTANE OVERNIGHT THERAPY) 0.3 % GEL ophthalmic ointment Place 1 application into both eyes at bedtime.   Yes [provider]  mirtazapine (REMERON) 15 MG tablet Take 15 mg by mouth at bedtime.   Yes [provider]  perphenazine (TRILAFON) 2 MG tablet Take 2 mg by mouth 3 (three) times daily.   Yes [provider]  polyethylene glycol (MIRALAX / GLYCOLAX) packet Take 17 g by mouth 2 (two) times daily. May take an additional 17 g as needed for constipation   Yes [provider]  pregabalin (LYRICA) 100 MG capsule Take 100 mg by mouth 2 (two) times daily.   Yes [provider]  pregabalin (LYRICA) 150 MG capsule Take 150 mg by mouth daily at 12 noon.    Yes [provider]  tiotropium (SPIRIVA) 18 MCG inhalation capsule Place 18 mcg into inhaler and inhale daily.   Yes [provider]  triamcinolone (NASACORT ALLERGY 24HR) 55 MCG/ACT AERO nasal inhaler Place 1 spray into the nose daily.   Yes [provider]  venlafaxine XR (EFFEXOR-XR) 75 MG 24 hr capsule Take 75 mg by mouth daily with breakfast.   Yes [provider]  HYDROcodone-acetaminophen (NORCO) 7.5-325 MG tablet Take 1 tablet by mouth 3 (three) times daily. Patient not taking: Reported on 05/17/2017 09/20/16   Arrien, Jimmy Picket, MD    Family History No family history on file.  Social History Social History   Tobacco Use  . Smoking status: Former Smoker    Types: Cigarettes  . Smokeless tobacco: Never Used  Substance Use Topics  . Alcohol use: No  . Drug use: No     Allergies   Codeine; Erythromycin; and Penicillins   Review of Systems Review of Systems  Unable to perform ROS: Mental status change     Physical Exam Updated Vital Signs BP 104/60   Pulse 81   Temp 99.6 F (37.6 C) (Oral)   Resp 17   Wt 92.1 kg (203 lb)   SpO2 100%   BMI 41.00 kg/m   Physical Exam  Constitutional: She  appears well-developed and well-nourished. She appears distressed.  Eyes: Conjunctivae and EOM are normal. Pupils are equal, round, and reactive to light.  Cardiovascular: Normal rate.  Pulmonary/Chest: Effort normal and breath sounds normal.  Abdominal: Soft.  Left lower quadrant colostomy right lower quadrant urostomy bag  Musculoskeletal:  Left leg above-knee amputation.  Right leg without movement patient does have a history of spina bifida.  Neurological:  Somnolent but awake will follow some commands.  Verbalizing some.  Some right facial droop.  Weakness to both upper extremities.  Skin: Skin is warm.  Nursing note and vitals reviewed.    ED Treatments / Results  Labs (all labs ordered are listed, but only abnormal  results are displayed) Labs Reviewed  PROTIME-INR - Abnormal; Notable for the following components:      Result Value   Prothrombin Time 15.8 (*)    All other components within normal limits  CBC - Abnormal; Notable for the following components:   WBC 12.6 (*)    HCT 46.5 (*)    MCV 100.4 (*)    RDW 17.0 (*)    All other components within normal limits  DIFFERENTIAL - Abnormal; Notable for the following components:   Neutro Abs 8.8 (*)    All other components within normal limits  COMPREHENSIVE METABOLIC PANEL - Abnormal; Notable for the following components:   Glucose, Bld 259 (*)    BUN 22 (*)    Calcium 8.7 (*)    Total Protein 6.4 (*)    Albumin 3.0 (*)    Alkaline Phosphatase 128 (*)    All other components within normal limits  URINALYSIS, ROUTINE W REFLEX MICROSCOPIC - Abnormal; Notable for the following components:   APPearance CLOUDY (*)    Glucose, UA 50 (*)    Protein, ur 30 (*)    Nitrite POSITIVE (*)    Leukocytes, UA LARGE (*)    Bacteria, UA MANY (*)    Non Squamous Epithelial 0-5 (*)    All other components within normal limits  I-STAT CHEM 8, ED - Abnormal; Notable for the following components:   Chloride 100 (*)    BUN 25 (*)     Glucose, Bld 261 (*)    Calcium, Ion 1.11 (*)    Hemoglobin 15.6 (*)    All other components within normal limits  CBG MONITORING, ED - Abnormal; Notable for the following components:   Glucose-Capillary 240 (*)    All other components within normal limits  I-STAT ARTERIAL BLOOD GAS, ED - Abnormal; Notable for the following components:   pH, Arterial 7.284 (*)    pCO2 arterial 67.1 (*)    pO2, Arterial 77.0 (*)    Bicarbonate 31.8 (*)    TCO2 34 (*)    Acid-Base Excess 3.0 (*)    All other components within normal limits  CULTURE, BLOOD (ROUTINE X 2)  CULTURE, BLOOD (ROUTINE X 2)  URINE CULTURE  ETHANOL  APTT  RAPID URINE DRUG SCREEN, HOSP PERFORMED  LACTIC ACID, PLASMA  I-STAT TROPONIN, ED    EKG  EKG Interpretation  Date/Time:  Tuesday May 17 2017 10:38:00 EST Ventricular Rate:  92 PR Interval:    QRS Duration: 84 QT Interval:  345 QTC Calculation: 427 R Axis:   -48 Text Interpretation:  Sinus rhythm Inferior infarct, old Minimal ST elevation, anterior leads Confirmed by Fredia Sorrow [phone]) on 05/17/2017 10:47:33 AM       Radiology Dg Chest Port 1 View  Result Date: 05/17/2017 CLINICAL DATA:  Hypoxia, code stroke EXAM: PORTABLE CHEST 1 VIEW COMPARISON:  Portable chest x-ray of 09/14/2016 FINDINGS: The lungs are not optimally aerated with mild volume loss at the lung bases. Minimally prominent markings are present at the left lung base most consistent with atelectasis. No definite pneumonia or pleural effusion is seen. Mediastinal and hilar contours are stable and mild cardiomegaly is stable. Thoracolumbar scoliosis again is noted. IMPRESSION: 1. Suboptimal inspiration with mild bibasilar volume loss. No definite pneumonia or pleural effusion. 2. Thoracolumbar scoliosis. Electronically Signed   By: Ivar Drape M.D.   On: 05/17/2017 11:21   Ct Renal Stone Study  Result Date: 05/17/2017 CLINICAL DATA:  History of kidney stones, cost  a me and urostomy backs EXAM: CT  ABDOMEN AND PELVIS WITHOUT CONTRAST TECHNIQUE: Multidetector CT imaging of the abdomen and pelvis was performed following the standard protocol without IV contrast. COMPARISON:  CT abdomen pelvis of 09/13/2016 FINDINGS: Lower chest: Bibasilar dependent atelectasis has increased. Pneumonia cannot be excluded. No definite pleural effusion is seen. The heart is unchanged in size and no pericardial effusion is seen. Hepatobiliary: Again there is severe fatty infiltration throughout the liver. No ductal dilatation is seen. Surgical clips are present from prior cholecystectomy. Pancreas: The pancreas is normal in size and the pancreatic duct is not dilated. Spleen: The spleen is minimally prominent but unremarkable. Adrenals/Urinary Tract: The adrenal glands are stable. Atrophic appearing right kidney is present with no change in right renal calculi. Compared stress hypertrophy of the left kidney is again noted with small left renal calculi present which are nonobstructing. The lobular appearance of the upper pole of the left kidney appears stable and compared to the prior study with IV contrast appears to represent normal renal parenchyma which is lobular. The ureters are not dilated. Diverting right lower quadrant urostomy is again noted, and there is little change in the appearance of the parastomal hernia containing a nondistended loop of small bowel and omental fat. The urinary bladder has previously been resected. Stomach/Bowel: The stomach is completely decompressed and cannot be evaluated. No small bowel distention is currently seen. Colostomy is noted left lower quadrant half with resection of a portion of the rectosigmoid colon previously. There is feces throughout the colon. Appendix and terminal ileum are not visible but the distal ileum is unremarkable. Vascular/Lymphatic: Abdominal aorta is normal in caliber with moderate abdominal aortic atherosclerosis present. No adenopathy is seen. Reproductive: The  uterus is normal in size. No adnexal lesion is seen. No fluid is noted within the pelvis. Other: Midline ventral hernia at the umbilicus containing only fat. Musculoskeletal: Thoracolumbar scoliosis again is noted with degenerative change. 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 IMPRESSION: 1. Bilateral kidney stones with atrophy of the right kidney and compensatory hypertrophy of the left kidney. No hydronephrosis is seen and diverting ureterostomy is unremarkable. 2. 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 3. Severe fatty infiltration diffusely throughout the liver. 4. Colostomy in left lower quadrant. 5. Probable bibasilar atelectasis. Difficult to exclude pneumonia. Correlate clinically. Electronically Signed   By: Ivar Drape M.D.   On: 05/17/2017 14:47   Ct Head Code Stroke Wo Contrast  Result Date: 05/17/2017 CLINICAL DATA:  Code stroke. 72 year old female with facial droop and slurred speech. Last seen normal 0700 hours. EXAM: CT HEAD WITHOUT CONTRAST TECHNIQUE: Contiguous axial images were obtained from the base of the skull through the vertex without intravenous contrast. COMPARISON:  04/06/2017 head CT, 06/04/2008 FINDINGS: Brain: Stable cerebral volume. Chronic ventriculomegaly, mildly progressed since 2010. No transependymal edema suspected. No cortically based acute infarct identified. No acute intracranial hemorrhage identified. Stable gray-white matter differentiation throughout the brain. Vascular: Mild Calcified atherosclerosis at the skull base. No suspicious intracranial vascular hyperdensity. Skull: No acute osseous abnormality identified. Sinuses/Orbits: Visualized paranasal sinuses and mastoids are stable and well pneumatized. Other: No acute orbit or scalp soft tissue findings. ASPECTS ([place] Stroke Program Early CT Score) - Ganglionic level infarction (caudate, lentiform nuclei, internal capsule,  insula, M1-M3 cortex): 7 - Supraganglionic infarction (M4-M6 cortex): 3 Total score (0-10 with 10 being normal): 10 IMPRESSION: 1. Stable noncontrast CT appearance of the brain since  04/06/2017. 2. ASPECTS is 10. 3. These results were communicated to Dr. Leonel Ramsay at 10:58 amon 3/5/2019by text page via the [facility] messaging system. Electronically Signed   By: Genevie Ann M.D.   On: 05/17/2017 10:58    Procedures Procedures (including critical care time)  CRITICAL CARE Performed by: Fredia Sorrow Total critical care time: 90 minutes Critical care time was exclusive of separately billable procedures and treating other patients. Critical care was necessary to treat or prevent imminent or life-threatening deterioration. Critical care was time spent personally by me on the following activities: development of treatment plan with patient and/or surrogate as well as nursing, discussions with consultants, evaluation of patient's response to treatment, examination of patient, obtaining history from patient or surrogate, ordering and performing treatments and interventions, ordering and review of laboratory studies, ordering and review of radiographic studies, pulse oximetry and re-evaluation of patient's condition.   Medications Ordered in ED Medications  cefTRIAXone (ROCEPHIN) 1 g in sodium chloride 0.9 % 100 mL IVPB (0 g Intravenous Stopped 05/17/17 1557)     Initial Impression / Assessment and Plan / ED Course  I have reviewed the triage vital signs and the nursing notes.  Pertinent labs & imaging results that were available during my care of the patient were reviewed by me and considered in my medical decision making (see chart for details).     Patient arrived with initial concerns for possible code stroke.  Initial report was patient was last normal at 7 in the morning.  Something happened after that.  Where she became very confused had right facial droop complaining of headache.  The patient  initially initiated as a code stroke.  Limited neuro exam due to her altered mental status and also the fact that she has spina bifida.  Long-standing.  Seen by the neuro hospitalist team.  Head CT without any acute findings.  Came to conclusion that we felt there was more probably an encephalopathy for some reason.  Also discussion with the nursing facility it appears that she had not been exactly normal for several days and there was no acute change right at 7.    Patient was a little bit hypoxic off of oxygen and needed couple liters nasal cannula neuro hospitalist did want to get an MRI.  That has been ordered and still pending.  To keep her sats in the low 90s.  Blood gas was done which showed evidence of an acidosis no hypoxia on the oxygen but had hypercapnia.  Based on this patient went on BiPAP.    Slight urinalysis had positive nitrite.  Now patient has urostomy tube. So this could be contamination.  However based on this patient was started on antibiotics for possible urinary tract infection.  This was done in consultation with the pharmacist.  Patient's lactic acid was not elevated.  Never was hypotensive.  Never truly met true criteria for sepsis.  Rest labs without any significant abnormalities.  Chest x-ray negative for pneumonia.  CT scan of the abdomen was done because in the summertime patient had trouble with retained stones.  And was concerned about that may be the cause of infection but however it was negative.  Does have stones in the kidneys but the ureters going to the urostomy without any evidence of obstruction.  Discussed with hospitalist they will admit.  Patient will be a stepdown admission.  Patient now starting to converse better starting to become a little more lucid.  Family member feels that she is improved  but not back to baseline at all yet.  Patient will remain on BiPAP.  Patient has MRI pending.    Final Clinical Impressions(s) / ED Diagnoses   Final diagnoses:   Altered mental status, unspecified altered mental status type  Acute cystitis without hematuria  Acute respiratory failure with hypoxia and hypercapnia [facility])    ED Discharge Orders    None       Fredia Sorrow, MD 05/17/17 1616

## 2017-05-17 NOTE — ED Triage Notes (Signed)
Patient presents to ed via [facility] from [facility] states she has been confused since Sunday this am 7am started with slurred speech and visual and auditory hallucinations. States she was pulling her colostomy and urostomy bags off. Abnormal behavorial for this patient.

## 2017-05-17 NOTE — ED Notes (Signed)
Pt sister at bedside °

## 2017-05-18 ENCOUNTER — Encounter (HOSPITAL_COMMUNITY): Payer: Self-pay | Admitting: General Practice

## 2017-05-18 DIAGNOSIS — N3000 Acute cystitis without hematuria: Secondary | ICD-10-CM

## 2017-05-18 DIAGNOSIS — E662 Morbid (severe) obesity with alveolar hypoventilation: Secondary | ICD-10-CM

## 2017-05-18 DIAGNOSIS — Z79899 Other long term (current) drug therapy: Secondary | ICD-10-CM

## 2017-05-18 LAB — COMPREHENSIVE METABOLIC PANEL
ALT: 19 U/L (ref 14–54)
AST: 18 U/L (ref 15–41)
Albumin: 2.9 g/dL — ABNORMAL LOW (ref 3.5–5.0)
Alkaline Phosphatase: 118 U/L (ref 38–126)
Anion gap: 11 (ref 5–15)
BILIRUBIN TOTAL: 1 mg/dL (ref 0.3–1.2)
BUN: 16 mg/dL (ref 6–20)
CO2: 26 mmol/L (ref 22–32)
Calcium: 8.2 mg/dL — ABNORMAL LOW (ref 8.9–10.3)
Chloride: 105 mmol/L (ref 101–111)
Creatinine, Ser: 0.75 mg/dL (ref 0.44–1.00)
Glucose, Bld: 260 mg/dL — ABNORMAL HIGH (ref 65–99)
POTASSIUM: 4.2 mmol/L (ref 3.5–5.1)
Sodium: 142 mmol/L (ref 135–145)
TOTAL PROTEIN: 6.2 g/dL — AB (ref 6.5–8.1)

## 2017-05-18 LAB — GLUCOSE, CAPILLARY
GLUCOSE-CAPILLARY: 251 mg/dL — AB (ref 65–99)
GLUCOSE-CAPILLARY: 233 mg/dL — AB (ref 65–99)
Glucose-Capillary: 263 mg/dL — ABNORMAL HIGH (ref 65–99)

## 2017-05-18 LAB — CBC WITH DIFFERENTIAL/PLATELET
BASOS ABS: 0.1 10*3/uL (ref 0.0–0.1)
Basophils Relative: 1 %
Eosinophils Absolute: 0.2 10*3/uL (ref 0.0–0.7)
Eosinophils Relative: 2 %
HEMATOCRIT: 47 % — AB (ref 36.0–46.0)
Hemoglobin: 13.5 g/dL (ref 12.0–15.0)
LYMPHS ABS: 2.2 10*3/uL (ref 0.7–4.0)
LYMPHS PCT: 21 %
MCH: 29.2 pg (ref 26.0–34.0)
MCHC: 28.7 g/dL — ABNORMAL LOW (ref 30.0–36.0)
MCV: 101.5 fL — AB (ref 78.0–100.0)
MONOS PCT: 4 %
Monocytes Absolute: 0.4 10*3/uL (ref 0.1–1.0)
NEUTROS ABS: 7.7 10*3/uL (ref 1.7–7.7)
Neutrophils Relative %: 72 %
Platelets: 180 10*3/uL (ref 150–400)
RBC: 4.63 MIL/uL (ref 3.87–5.11)
RDW: 16.8 % — AB (ref 11.5–15.5)
WBC: 10.5 10*3/uL (ref 4.0–10.5)

## 2017-05-18 LAB — URINE CULTURE

## 2017-05-18 MED ORDER — ORAL CARE MOUTH RINSE
15.0000 mL | Freq: Two times a day (BID) | OROMUCOSAL | Status: DC
  Administered 2017-05-19 – 2017-05-20 (×2): 15 mL via OROMUCOSAL

## 2017-05-18 MED ORDER — CHLORHEXIDINE GLUCONATE 0.12 % MT SOLN
15.0000 mL | Freq: Two times a day (BID) | OROMUCOSAL | Status: DC
  Administered 2017-05-18 – 2017-05-20 (×3): 15 mL via OROMUCOSAL
  Filled 2017-05-18 (×3): qty 15

## 2017-05-18 NOTE — Consult Note (Signed)
WOC Nurse ostomy consult note Stoma type/location: LUQ colostomy and RLQ urostomy Colostomy pouching system while inpatient will consist of Hollister 2 3/4inch barrier Hart [place]son #2), and pouch Hart [place](Lawson [phone]).  Our system does not offer the urostomy pouch the patient has been using, so we will substiture a one piece convex urostomy pouch Hart [place](Lawson [phone]).  Patient's primary RN provided supply numbers and asked to order three of each.  Discussed POC with patient and bedside nurse.  Re consult if needed, will not follow at this time. Thank you,  Helmut MusterSherry Kylah Maresh MSN,RN,CWOCN,CNS-BC, [phone])

## 2017-05-18 NOTE — Progress Notes (Signed)
CSW [facility] by Alphon[facility] LemmingsWhitney [facility]. Patient is a resident of SNF, and can return at discharge.  CSW will continue to follow for medical stability to return to SNF.  Blenda NicelyElizabeth Zoe Nordin, Kentucky[place]CS[place] Clinical Social Worker [phone]

## 2017-05-18 NOTE — Progress Notes (Signed)
Inpatient Diabetes Program Recommendations  AACE/ADA: New Consensus Statement on Inpatient Glycemic Control (2015)  Target Ranges:  Prepandial:   less than 140 mg/dL      Peak postprandial:   less than 180 mg/dL (1-2 hours)      Critically ill patients:  140 - 180 mg/dL   Lab Results  Component Value Date   GLUCAP 263 (H) 05/18/2017   HGBA1C 10.2 (H) 05/17/2017    Review of Glycemic ControlResults for Deborah ChimesHOPPING, Deborah Young (MRN [id number]) as of 05/18/2017 10:05  Ref. Range 05/17/2017 10:36 05/18/2017 06:43  Glucose-Capillary Latest Ref Range: 65 - 99 mg/dL 132240 (H) 440263 (H)    Diabetes history: ? New diagnosis of DM Outpatient Diabetes medications:  None Current orders for Inpatient glycemic control:  Novolog sensitive tid with meals Inpatient Diabetes Program Recommendations:   Please consider adding Lantus 12 units daily.  Patient likely not appropriate for DM education at this time.  She is from SNF.  Will follow.   Thanks,  Beryl MeagerJenny Jourdain Guay, RN, BC-ADM Inpatient Diabetes Coordinator Pager [phone] (8a-5p)

## 2017-05-18 NOTE — Progress Notes (Signed)
Subjective: Much improved  Exam: Vitals:   05/18/17 0737 05/18/17 0808  BP: 125/67   Pulse:    Resp:    Temp:    SpO2: 90% 92%   Gen: In bed, NAD Resp: non-labored breathing, no acute distress Abd: soft, nt  Neuro: MS: awake, alert,  ZO:XWRUCN:Eyes dysconjugate(baseline), VFF, face symmetric Motor: able to lift both arms out of bed today, still 4/5 bilaterally.  Sensory:intact to LT  Pertinent Labs: Most recent ABG PCO2 65  Impression: 72 yo F with lethargy in the setting of hypercarbic respiratory failure. I also suspect some underlying cognitive disorder.   Recommendations: 1) No further recommendations at this time. With improvement, I have canceled MRI brain.  2) Please call with further questions or concerns.   Ritta SlotMcNeill Bently Wyss, MD [facility] [phone]  If 7pm- 7am, please page neurology on call as listed in AMION.

## 2017-05-18 NOTE — Progress Notes (Signed)
Patient pulling on lines disconnected BiPAP, put mitts on her.

## 2017-05-18 NOTE — Progress Notes (Signed)
PROGRESS NOTE  Deborah Young MWU:[id number] DOB: 1945/10/21 DOA: 05/17/2017 PCP: Nadara Eaton, MD  HPI/Recap of past 24 hours: Deborah Young is a 72 y.o. female with medical history significant of spina bifida, dementia, schizophrenia, history of kidney stones, recurrent urinary tract infection, mood disorder, insomnia, polyneuropathy, single kidney, sleep apnea admitted with complaints of change in mental status and hallucination.  Admitted for acute hypoxic hypercarbic respiratory failure.  05/18/2017: Patient seen and examined at her bedside.  She is on 5 L of oxygen saturating in the low 90s.  She denies any chest pain or dyspnea.  Unable to obtain a history due to dementia.    Assessment/Plan: Active Problems:   Respiratory failure (HCC)  Acute hypoxic hypercarbic respiratory failure due to suspected polypharmacy with pain medications and psych meds -Continue O2 sat supplementation to maintain O2 saturation 92% or above -Close monitoring overnight -Continue Symbicort, Dulera, Nasacort, Spiriva -BiPAP as needed with hypercarbia  Urinary tract infection -Urine culture pending -Continue IV ceftriaxone -Monitor urine output  Hyperglycemia -Obtain A1c -Continue insulin sliding scale  Obesity -Weight loss once stable in the outpatient  Depression/anxiety -Continue Effexor     Code Status: DNR  Family Communication: Normal bedside  Disposition Plan: Discharge to sniff when medically stable   Consultants:  None  Procedures:  None  Antimicrobials:  IV ceftriaxone  DVT prophylaxis: SCDs, sq lovenox daily   Objective: Vitals:   05/18/17 0737 05/18/17 0808 05/18/17 1241 05/18/17 1620  BP: 125/67  112/71 108/76  Pulse:   88 83  Resp:   20 18  Temp:   99.2 F (37.3 C) 98.9 F (37.2 C)  TempSrc:   Oral Oral  SpO2: 90% 92% 94% 93%  Weight:      Height:        Intake/Output Summary (Last 24 hours) at 05/18/2017 1754 Last data filed at 05/18/2017  1700 Gross per 24 hour  Intake 2123.33 ml  Output 2000 ml  Net 123.33 ml   Filed Weights   05/17/17 1109 05/17/17 1830  Weight: 92.1 kg (203 lb) 85.7 kg (188 lb 15 oz)    Exam:   General: 72 year old Caucasian female well-developed well-nourished bilateral lower extremity amputation.  Alert but confused in setting of dementia  Cardiovascular: Regular rate and rhythm with no rubs or gallops  Respiratory: Mild rales at bases  Abdomen: Obese colostomy on the left and a urostomy on the right  Musculoskeletal: Bilateral lower extremity amputation  Skin: Spina bifida  Psychiatry: Mood is appropriate   Data Reviewed: CBC: Recent Labs  Lab 05/17/17 1045 05/17/17 1051 05/18/17 0656  WBC 12.6*  --  10.5  NEUTROABS 8.8*  --  7.7  HGB 14.2 15.6* 13.5  HCT 46.5* 46.0 47.0*  MCV 100.4*  --  101.5*  PLT 180  --  180   Basic Metabolic Panel: Recent Labs  Lab 05/17/17 1045 05/17/17 1051 05/18/17 0656  NA 138 139 142  K 4.8 4.7 4.2  CL 101 100* 105  CO2 24  --  26  GLUCOSE 259* 261* 260*  BUN 22* 25* 16  CREATININE 0.82 0.80 0.75  CALCIUM 8.7*  --  8.2*   GFR: Estimated Creatinine Clearance: 63.1 mL/min (by C-G formula based on SCr of 0.75 mg/dL). Liver Function Tests: Recent Labs  Lab 05/17/17 1045 05/18/17 0656  AST 22 18  ALT 21 19  ALKPHOS 128* 118  BILITOT 0.6 1.0  PROT 6.4* 6.2*  ALBUMIN 3.0* 2.9*   No  results for input(s): LIPASE, AMYLASE in the last 168 hours. No results for input(s): AMMONIA in the last 168 hours. Coagulation Profile: Recent Labs  Lab 05/17/17 1045  INR 1.27   Cardiac Enzymes: No results for input(s): CKTOTAL, CKMB, CKMBINDEX, TROPONINI in the last 168 hours. BNP (last 3 results) No results for input(s): PROBNP in the last 8760 hours. HbA1C: Recent Labs    05/17/17 1839  HGBA1C 10.2*   CBG: Recent Labs  Lab 05/17/17 1036 05/18/17 0643 05/18/17 1247 05/18/17 1650  GLUCAP 240* 263* 251* 233*   Lipid Profile: No  results for input(s): CHOL, HDL, LDLCALC, TRIG, CHOLHDL, LDLDIRECT in the last 72 hours. Thyroid Function Tests: No results for input(s): TSH, T4TOTAL, FREET4, T3FREE, THYROIDAB in the last 72 hours. Anemia Panel: No results for input(s): VITAMINB12, FOLATE, FERRITIN, TIBC, IRON, RETICCTPCT in the last 72 hours. Urine analysis:    Component Value Date/Time   COLORURINE YELLOW 05/17/2017 1035   APPEARANCEUR CLOUDY (A) 05/17/2017 1035   LABSPEC 1.014 05/17/2017 1035   PHURINE 8.0 05/17/2017 1035   GLUCOSEU 50 (A) 05/17/2017 1035   HGBUR NEGATIVE 05/17/2017 1035   BILIRUBINUR NEGATIVE 05/17/2017 1035   KETONESUR NEGATIVE 05/17/2017 1035   PROTEINUR 30 (A) 05/17/2017 1035   UROBILINOGEN 0.2 02/27/2008 1331   NITRITE POSITIVE (A) 05/17/2017 1035   LEUKOCYTESUR LARGE (A) 05/17/2017 1035   Sepsis Labs: @LABRCNTIP (procalcitonin:4,lacticidven:4)  ) Recent Results (from the past 240 hour(s))  Culture, blood (Routine X 2) w Reflex to ID Panel     Status: None (Preliminary result)   Collection Time: 05/17/17 11:39 AM  Result Value Ref Range Status   Specimen Description BLOOD LEFT ANTECUBITAL  Final   Special Requests   Final    BOTTLES DRAWN AEROBIC AND ANAEROBIC Blood Culture adequate volume   Culture   Final    NO GROWTH 1 DAY Performed at [facility] Lab, [address] [place], [place] [address]    Report Status PENDING  Incomplete  Culture, blood (Routine X 2) w Reflex to ID Panel     Status: None (Preliminary result)   Collection Time: 05/17/17 12:05 PM  Result Value Ref Range Status   Specimen Description BLOOD LEFT FOREARM  Final   Special Requests IN PEDIATRIC BOTTLE Blood Culture adequate volume  Final   Culture   Final    NO GROWTH 1 DAY Performed at [facility], [address] [place], [place] [address]    Report Status PENDING  Incomplete  Urine Culture     Status: Abnormal   Collection Time: 05/17/17 12:23 PM  Result Value Ref Range Status   Specimen  Description URINE, CLEAN CATCH  Final   Special Requests   Final    NONE Performed at [facility], [address] [place], [place] [address]    Culture MULTIPLE SPECIES PRESENT, SUGGEST RECOLLECTION (A)  Final   Report Status 05/18/2017 FINAL  Final  MRSA PCR Screening     Status: None   Collection Time: 05/17/17  6:54 PM  Result Value Ref Range Status   MRSA by PCR NEGATIVE NEGATIVE Final    Comment:        The GeneXpert MRSA Assay (FDA approved for NASAL specimens only), is one component of a comprehensive MRSA colonization surveillance program. It is not intended to diagnose MRSA infection nor to guide or monitor treatment for MRSA infections. Performed at [facility], [address], [place], [place] [address]  Studies: No results found.  Scheduled Meds: . artificial tears   Both Eyes QHS  . aspirin EC  81 mg Oral Daily  . chlorhexidine  15 mL Mouth Rinse BID  . [START ON 05/19/2017] cholecalciferol  1,000 Units Oral Q Thu  . enoxaparin (LOVENOX) injection  40 mg Subcutaneous Q24H  . insulin aspart  0-9 Units Subcutaneous TID WC  . mouth rinse  15 mL Mouth Rinse q12n4p  . mometasone-formoterol  2 puff Inhalation BID  . perphenazine  2 mg Oral TID  . polyethylene glycol  17 g Oral BID  . tiotropium  18 mcg Inhalation Daily  . triamcinolone  1 spray Nasal Daily  . venlafaxine XR  75 mg Oral Q breakfast    Continuous Infusions: . sodium chloride 100 mL/hr at 05/18/17 1514  . cefTRIAXone (ROCEPHIN)  IV Stopped (05/18/17 1435)     LOS: 1 day     Darlin Droparole N Hamsini Verrilli, MD [facility] Pager [phone]  If 7PM-7AM, please contact night-coverage [email or web address] Password [email or web address] 05/18/2017, 5:54 PM

## 2017-05-18 NOTE — Progress Notes (Signed)
Changed patients urostomy bag and base as base had pulled away from her body, emptied and cleaned colostomy bag also.

## 2017-05-18 NOTE — Progress Notes (Signed)
Pt is off BIPAP at this time on 5 L [place]. With vs within normal limits. No distress noted. RN aware. RT will continue to monitor.

## 2017-05-18 NOTE — Progress Notes (Signed)
Pt refuses to keep bipap on- keeps ripping off, despite having mittens on.  Pt placed on 6L [place]. Will continue to monitor closely and update as needed.

## 2017-05-19 DIAGNOSIS — R262 Difficulty in walking, not elsewhere classified: Secondary | ICD-10-CM

## 2017-05-19 DIAGNOSIS — N390 Urinary tract infection, site not specified: Secondary | ICD-10-CM

## 2017-05-19 DIAGNOSIS — F339 Major depressive disorder, recurrent, unspecified: Secondary | ICD-10-CM

## 2017-05-19 LAB — GLUCOSE, CAPILLARY
GLUCOSE-CAPILLARY: 212 mg/dL — AB (ref 65–99)
Glucose-Capillary: 198 mg/dL — ABNORMAL HIGH (ref 65–99)
Glucose-Capillary: 172 mg/dL — ABNORMAL HIGH (ref 65–99)
Glucose-Capillary: 200 mg/dL — ABNORMAL HIGH (ref 65–99)
Glucose-Capillary: 187 mg/dL — ABNORMAL HIGH (ref 65–99)
Glucose-Capillary: 191 mg/dL — ABNORMAL HIGH (ref 65–99)

## 2017-05-19 MED ORDER — OXYCODONE HCL 5 MG PO TABS
5.0000 mg | ORAL_TABLET | Freq: Four times a day (QID) | ORAL | Status: DC | PRN
  Administered 2017-05-19: 5 mg via ORAL
  Filled 2017-05-19: qty 1

## 2017-05-19 MED ORDER — DIPHENHYDRAMINE HCL 50 MG/ML IJ SOLN
12.5000 mg | Freq: Once | INTRAMUSCULAR | Status: AC
  Administered 2017-05-19: 12.5 mg via INTRAVENOUS
  Filled 2017-05-19: qty 1

## 2017-05-19 MED ORDER — ACETAMINOPHEN 325 MG PO TABS
650.0000 mg | ORAL_TABLET | Freq: Four times a day (QID) | ORAL | Status: DC | PRN
  Administered 2017-05-19 – 2017-05-20 (×3): 650 mg via ORAL
  Filled 2017-05-19 (×3): qty 2

## 2017-05-19 MED ORDER — HYDRALAZINE HCL 20 MG/ML IJ SOLN: 10.0000 mg | Freq: Four times a day (QID) | INTRAMUSCULAR | Status: DC | PRN

## 2017-05-19 MED ORDER — KETOROLAC TROMETHAMINE 15 MG/ML IJ SOLN
15.0000 mg | Freq: Once | INTRAMUSCULAR | Status: AC
  Administered 2017-05-19: 15 mg via INTRAVENOUS
  Filled 2017-05-19: qty 1

## 2017-05-19 MED ORDER — METOCLOPRAMIDE HCL 5 MG/ML IJ SOLN
5.0000 mg | Freq: Once | INTRAMUSCULAR | Status: AC
  Administered 2017-05-19: 5 mg via INTRAVENOUS
  Filled 2017-05-19: qty 2

## 2017-05-19 NOTE — Plan of Care (Signed)
  Progressing Education: Knowledge of General Education information will improve 05/19/2017 0044 - Progressing by Earnest Rosierfori, Torrion Witter O, RN Nutrition: Adequate nutrition will be maintained 05/19/2017 0044 - Progressing by Earnest Rosierfori, Phineas Mcenroe O, RN Elimination: Will not experience complications related to bowel motility 05/19/2017 0044 - Progressing by Earnest Rosierfori, Brittin Janik O, RN Pain Managment: General experience of comfort will improve 05/19/2017 0044 - Progressing by Earnest Rosierfori, Marguriete Wootan O, RN Safety: Ability to remain free from injury will improve 05/19/2017 0044 - Progressing by Earnest Rosierfori, Jahmani Staup O, RN

## 2017-05-19 NOTE — Progress Notes (Signed)
PROGRESS NOTE  Deborah Young YNW:[id number] DOB: 01-11-1946 DOA: 05/17/2017 PCP: Nadara EatonPiazza, Michael J, MD  HPI/Recap of past 24 hours: Deborah Young is a 72 y.o. female with medical history significant of spina bifida, dementia, schizophrenia, history of kidney stones, recurrent urinary tract infection, mood disorder, insomnia, polyneuropathy, single kidney, sleep apnea admitted with complaints of change in mental status and hallucination.  Admitted for acute hypoxic hypercarbic respiratory failure.  05/18/2017: Patient seen and examined at her bedside.  She is on 5 L of oxygen saturating in the low 90s.  She denies any chest pain or dyspnea.  Unable to obtain a history due to dementia.  05/19/2017: Patient seen and examined at her bedside.  Oxygen demand is improving she is now on 2 L of oxygen and saturating in the 90s.  Denies dyspnea or chest pain or palpitations.  Reports headaches and is given headaches cocktail.  Denies nausea or abdominal pain.   Assessment/Plan: Active Problems:   Respiratory failure (HCC)  Acute hypoxic hypercarbic respiratory failure due to suspected polypharmacy with pain medications and psych meds, improving -Continue O2 sat supplementation to maintain O2 saturation 92% or above -Close monitoring overnight -Continue Symbicort, Dulera, Nasacort, Spiriva -BiPAP as needed with hypercarbia  Urinary tract infection -Urine culture multiple species present suggest recollection -Continue IV ceftriaxone -Monitor urine output  Hyperglycemia -Obtain A1c -Continue insulin sliding scale  Obesity -Weight loss once stable in the outpatient  Depression/anxiety -Continue Effexor     Code Status: DNR  Family Communication: None at bedside  Disposition Plan: Discharge to SNF tomorrow if no events overnight.  Consultants:  None  Procedures:  None  Antimicrobials:  IV ceftriaxone  DVT prophylaxis: SCDs, sq lovenox daily   Objective: Vitals:   05/19/17 0449 05/19/17 0858 05/19/17 1202 05/19/17 1604  BP: (!) 114/53 133/68 131/76 (!) 150/68  Pulse: 81 78 80 79  Resp: 16 16 20 20   Temp: 99 F (37.2 C) 97.8 F (36.6 C) 99.3 F (37.4 C) 98.7 F (37.1 C)  TempSrc: Oral Oral Oral Oral  SpO2: 94% 94% 99% 94%  Weight:      Height:        Intake/Output Summary (Last 24 hours) at 05/19/2017 1822 Last data filed at 05/19/2017 1757 Gross per 24 hour  Intake 360 ml  Output 1125 ml  Net -765 ml   Filed Weights   05/17/17 1109 05/17/17 1830  Weight: 92.1 kg (203 lb) 85.7 kg (188 lb 15 oz)    Exam: 05/19/2017.  Patient seen and examined.  Physical exam is essentially unchanged except for what mentioned below.   General: 72 year old Caucasian female well-developed well-nourished bilateral lower extremity amputation.  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 and a urostomy on the right  Musculoskeletal: Bilateral lower extremity amputation  Skin: Spina bifida  Psychiatry: Mood is appropriate   Data Reviewed: CBC: Recent Labs  Lab 05/17/17 1045 05/17/17 1051 05/18/17 0656  WBC 12.6*  --  10.5  NEUTROABS 8.8*  --  7.7  HGB 14.2 15.6* 13.5  HCT 46.5* 46.0 47.0*  MCV 100.4*  --  101.5*  PLT 180  --  180   Basic Metabolic Panel: Recent Labs  Lab 05/17/17 1045 05/17/17 1051 05/18/17 0656  NA 138 139 142  K 4.8 4.7 4.2  CL 101 100* 105  CO2 24  --  26  GLUCOSE 259* 261* 260*  BUN 22* 25* 16  CREATININE 0.82 0.80 0.75  CALCIUM 8.7*  --  8.2*   GFR: Estimated Creatinine Clearance: 63.1 mL/min (by C-G formula based on SCr of 0.75 mg/dL). Liver Function Tests: Recent Labs  Lab 05/17/17 1045 05/18/17 0656  AST 22 18  ALT 21 19  ALKPHOS 128* 118  BILITOT 0.6 1.0  PROT 6.4* 6.2*  ALBUMIN 3.0* 2.9*   No results for input(s): LIPASE, AMYLASE in the last 168 hours. No results for input(s):  AMMONIA in the last 168 hours. Coagulation Profile: Recent Labs  Lab 05/17/17 1045  INR 1.27   Cardiac Enzymes: No results for input(s): CKTOTAL, CKMB, CKMBINDEX, TROPONINI in the last 168 hours. BNP (last 3 results) No results for input(s): PROBNP in the last 8760 hours. HbA1C: Recent Labs    05/17/17 1839  HGBA1C 10.2*   CBG: Recent Labs  Lab 05/18/17 2118 05/19/17 0630 05/19/17 0728 05/19/17 1137 05/19/17 1648  GLUCAP 198* 200* 172* 212* 187*   Lipid Profile: No results for input(s): CHOL, HDL, LDLCALC, TRIG, CHOLHDL, LDLDIRECT in the last 72 hours. Thyroid Function Tests: No results for input(s): TSH, T4TOTAL, FREET4, T3FREE, THYROIDAB in the last 72 hours. Anemia Panel: No results for input(s): VITAMINB12, FOLATE, FERRITIN, TIBC, IRON, RETICCTPCT in the last 72 hours. Urine analysis:    Component Value Date/Time   COLORURINE YELLOW 05/17/2017 1035   APPEARANCEUR CLOUDY (A) 05/17/2017 1035   LABSPEC 1.014 05/17/2017 1035   PHURINE 8.0 05/17/2017 1035   GLUCOSEU 50 (A) 05/17/2017 1035   HGBUR NEGATIVE 05/17/2017 1035   BILIRUBINUR NEGATIVE 05/17/2017 1035   KETONESUR NEGATIVE 05/17/2017 1035   PROTEINUR 30 (A) 05/17/2017 1035   UROBILINOGEN 0.2 02/27/2008 1331   NITRITE POSITIVE (A) 05/17/2017 1035   LEUKOCYTESUR LARGE (A) 05/17/2017 1035   Sepsis Labs: @LABRCNTIP (procalcitonin:4,lacticidven:4)  ) Recent Results (from the past 240 hour(s))  Culture, blood (Routine X 2) w Reflex to ID Panel     Status: None (Preliminary result)   Collection Time: 05/17/17 11:39 AM  Result Value Ref Range Status   Specimen Description BLOOD LEFT ANTECUBITAL  Final   Special Requests   Final    BOTTLES DRAWN AEROBIC AND ANAEROBIC Blood Culture adequate volume   Culture   Final    NO GROWTH 2 DAYS Performed at [facility], [address] [place], [place] [address]    Report Status PENDING  Incomplete  Culture, blood (Routine X 2) w Reflex to ID Panel     Status:  None (Preliminary result)   Collection Time: 05/17/17 12:05 PM  Result Value Ref Range Status   Specimen Description BLOOD LEFT FOREARM  Final   Special Requests IN PEDIATRIC BOTTLE Blood Culture adequate volume  Final   Culture   Final    NO GROWTH 2 DAYS Performed at [facility], [address], [place], [place] [address]    Report Status PENDING  Incomplete  Urine Culture     Status: Abnormal   Collection Time: 05/17/17 12:23 PM  Result Value Ref Range Status   Specimen Description URINE, CLEAN CATCH  Final   Special Requests   Final    NONE Performed at [facility], [address], [place], [place] [address]    Culture MULTIPLE SPECIES PRESENT, SUGGEST RECOLLECTION (A)  Final   Report Status 05/18/2017 FINAL  Final  MRSA PCR Screening     Status: None   Collection Time: 05/17/17  6:54 PM  Result Value Ref Range Status   MRSA by PCR NEGATIVE NEGATIVE Final    Comment:        The GeneXpert MRSA Assay (FDA approved for NASAL specimens only), is one component of a comprehensive MRSA colonization surveillance program. It is not intended to diagnose MRSA infection nor to guide or monitor treatment for MRSA infections. Performed at [facility] Lab, [address] [place], [place] [address]       Studies: No results found.  Scheduled Meds: . artificial tears   Both Eyes QHS  . aspirin EC  81 mg Oral Daily  . chlorhexidine  15 mL Mouth Rinse BID  . cholecalciferol  1,000 Units Oral Q Thu  . enoxaparin (LOVENOX) injection  40 mg Subcutaneous Q24H  . insulin aspart  0-9 Units Subcutaneous TID WC  . mouth rinse  15 mL Mouth Rinse q12n4p  . mometasone-formoterol  2 puff Inhalation BID  . perphenazine  2 mg Oral TID  . polyethylene glycol  17 g Oral BID  . tiotropium  18 mcg Inhalation Daily  . triamcinolone  1 spray Nasal Daily  . venlafaxine XR  75 mg Oral Q breakfast    Continuous Infusions: . cefTRIAXone (ROCEPHIN)  IV Stopped (05/19/17 1110)      LOS: 2 days     Darlin Drop, MD [facility] Pager [phone]  If 7PM-7AM, please contact night-coverage [email or web address] Password [facility] 05/19/2017, 6:22 PM

## 2017-05-19 NOTE — Care Management Note (Signed)
Case Management Note  Patient Details  Name: Deborah ChimesSusan G Faughnan MRN: [id number] Date of Birth: Sep 07, 1945  Subjective/Objective:     Pt admitted with respiratory failure. [facility]               Action/Plan: Plan [facility]. CM following.   Expected Discharge Date:                  Expected Discharge Plan:  Skilled Nursing Facility  In-House Referral:  Clinical Social Work  Discharge planning Services     Post Acute Care Choice:    Choice offered to:     DME Arranged:    DME Agency:     HH Arranged:    HH Agency:     Status of Service:  In process, will continue to follow  If discussed at Long Length of Stay Meetings, dates discussed:    Additional Comments:  Kermit BaloKelli F Seba Madole, RN 05/19/2017, 11:50 AM

## 2017-05-19 NOTE — Progress Notes (Signed)
Pt refused to used Bipap tonight.  RT stressed the need for her to wear it while she sleeps but patient stated she did not want to wear.  Rt will continue to monitor.

## 2017-05-19 NOTE — Progress Notes (Signed)
Inpatient Diabetes Program Recommendations  AACE/ADA: New Consensus Statement on Inpatient Glycemic Control (2015)  Target Ranges:  Prepandial:   less than 140 mg/dL      Peak postprandial:   less than 180 mg/dL (1-2 hours)      Critically ill patients:  140 - 180 mg/dL   Lab Results  Component Value Date   GLUCAP 172 (H) 05/19/2017   HGBA1C 10.2 (H) 05/17/2017    Review of Glycemic ControlResults for Deborah ChimesHOPPING, Deborah Young (MRN [id number]) as of 05/19/2017 10:50  Ref. Range 05/18/2017 12:47 05/18/2017 16:50 05/18/2017 21:18 05/19/2017 06:30 05/19/2017 07:28  Glucose-Capillary Latest Ref Range: 65 - 99 mg/dL 409251 (H) 811233 (H) 914198 (H) 200 (H) 172 (H)   Diabetes history: Type 2 DM Outpatient Diabetes medications: None Current orders for Inpatient glycemic control:  Novolog sensitive tid with meals Inpatient Diabetes Program Recommendations:   May consider adding Lantus 10 units daily.   Thanks, Beryl MeagerJenny Kristeena Meineke, RN, BC-ADM Inpatient Diabetes Coordinator Pager [phone] (8a-5p)

## 2017-05-20 LAB — GLUCOSE, CAPILLARY
GLUCOSE-CAPILLARY: 195 mg/dL — AB (ref 65–99)
Glucose-Capillary: 245 mg/dL — ABNORMAL HIGH (ref 65–99)

## 2017-05-20 LAB — BASIC METABOLIC PANEL
Anion gap: 10 (ref 5–15)
BUN: 7 mg/dL (ref 6–20)
CO2: 27 mmol/L (ref 22–32)
Calcium: 8.3 mg/dL — ABNORMAL LOW (ref 8.9–10.3)
Chloride: 102 mmol/L (ref 101–111)
Creatinine, Ser: 0.55 mg/dL (ref 0.44–1.00)
GFR calc Af Amer: >60 mL/min (ref >60)
GFR calc non Af Amer: >60 mL/min (ref >60)
Glucose, Bld: 174 mg/dL — ABNORMAL HIGH (ref 65–99)
Potassium: 3 mmol/L — ABNORMAL LOW (ref 3.5–5.1)
Sodium: 139 mmol/L (ref 135–145)

## 2017-05-20 MED ORDER — MAGNESIUM SULFATE 2 GM/50ML IV SOLN
2.0000 g | Freq: Once | INTRAVENOUS | Status: DC
  Filled 2017-05-20: qty 50

## 2017-05-20 MED ORDER — POTASSIUM CHLORIDE 10 MEQ/100ML IV SOLN
10.0000 meq | INTRAVENOUS | Status: AC
  Filled 2017-05-20 (×4): qty 100

## 2017-05-20 MED ORDER — POTASSIUM CHLORIDE CRYS ER 20 MEQ PO TBCR
40.0000 meq | EXTENDED_RELEASE_TABLET | Freq: Once | ORAL | Status: AC
  Administered 2017-05-20: 40 meq via ORAL
  Filled 2017-05-20: qty 2

## 2017-05-20 NOTE — Care Management Note (Signed)
Case Management Note  Patient Details  Name: Marella ChimesSusan G Bovard MRN: [id number] Date of Birth: 1945/03/21  Subjective/Objective:                    Action/Plan: [facility] returning to [facility]. CM signing off.   Expected Discharge Date:  05/20/17               Expected Discharge Plan:  Skilled Nursing Facility  In-House Referral:  Clinical Social Work  Discharge planning Services     Post Acute Care Choice:    Choice offered to:     DME Arranged:    DME Agency:     HH Arranged:    HH Agency:     Status of Service:  Completed, signed off  If discussed at [employer] Meetings, dates discussed:    Additional Comments:  Kermit BaloKelli F Kamile Fassler, RN 05/20/2017, 1:35 PM

## 2017-05-20 NOTE — Progress Notes (Signed)
Discharge to: [facility] Anticipated discharge date: 05/20/17 Family notified: Precious BardYes, Anne, at bedside Transportation by: [facility]  Report #: [phone], Room 6011-B  CSW signing off.  Blenda Nicelylizabeth Savonna Birchmeier LCSW [phone]

## 2017-05-20 NOTE — Progress Notes (Signed)
Pt is d/c back to [facility]. D/C instructions will be called in to receiving nurse. Pt is stable, no new concerns. [facility] will transport pt from the hospital

## 2017-05-20 NOTE — [place] FL2 (Signed)
[place] MEDICAID FL2 LEVEL OF CARE SCREENING TOOL     IDENTIFICATION  Patient Name: Deborah Young Birthdate: [birth year] Sex: female Admission Date (Current Location): 05/17/2017  [facility] and [place] Number:  Producer, television/film/video and Address:  [facility], [address] [place], [place] [address]      Provider Number: [id number]  Attending Physician Name and Address:  Darlin Drop, DO  Relative Name and Phone Number:       Current Level of Care: Hospital Recommended Level of Care: Skilled Nursing Facility Prior Approval Number:    Date Approved/Denied:   PASRR Number:    Discharge Plan: SNF    Current Diagnoses: Patient Active Problem List   Diagnosis Date Noted  . Respiratory failure (HCC) 05/17/2017  . SBO (small bowel obstruction) (HCC) 09/13/2016  . DYSLIPIDEMIA 01/20/2010  . Essential hypertension, benign 01/20/2010  . SHORTNESS OF BREATH 01/20/2010  . CHEST PAIN 01/20/2010  . CAD (coronary artery disease) 01/16/2010  . COPD 01/16/2010  . GERD 01/16/2010    Orientation RESPIRATION BLADDER Height & Weight     Self, Place  O2(see DC summary) Urostomy Weight: 188 lb 15 oz (85.7 kg) Height:  4\' 11"  (149.9 cm)  BEHAVIORAL SYMPTOMS/MOOD NEUROLOGICAL BOWEL NUTRITION STATUS      Colostomy Diet(carb modified)  AMBULATORY STATUS COMMUNICATION OF NEEDS Skin   Extensive Assist Verbally Normal                       Personal Care Assistance Level of Assistance  Bathing, Feeding, Dressing Bathing Assistance: Maximum assistance Feeding assistance: Limited assistance Dressing Assistance: Maximum assistance     Functional Limitations Info  Sight, Hearing, Speech Sight Info: Adequate Hearing Info: Adequate Speech Info: Adequate    SPECIAL CARE FACTORS FREQUENCY                       Contractures      Additional Factors Info  Code Status, Allergies, Insulin Sliding Scale Code Status Info: DNR Allergies Info:  Codeine, Erythromycin, Penicillins   Insulin Sliding Scale Info: 0-9 units 3x/day with meals       Current Medications (05/20/2017):  This is the current hospital active medication list Current Facility-Administered Medications  Medication Dose Route Frequency Provider Last Rate Last Dose  . acetaminophen (TYLENOL) tablet 650 mg  650 mg Oral Q6H PRN Leda Gauze, NP   650 mg at 05/19/17 2039  . artificial tears (LACRILUBE) ophthalmic ointment   Both Eyes QHS Alwyn Ren, MD      . aspirin EC tablet 81 mg  81 mg Oral Daily Alwyn Ren, MD   81 mg at 05/19/17 0908  . cefTRIAXone (ROCEPHIN) 1 g in sodium chloride 0.9 % 100 mL IVPB  1 g Intravenous Daily Vanetta Mulders, MD   Stopped at 05/19/17 1110  . chlorhexidine (PERIDEX) 0.12 % solution 15 mL  15 mL Mouth Rinse BID Dow Adolph N, DO   15 mL at 05/19/17 0907  . cholecalciferol (VITAMIN D) tablet 1,000 Units  1,000 Units Oral Q Petra Kuba, MD   1,000 Units at 05/19/17 [phone]  . clobetasol cream (TEMOVATE) 0.05 % 1 application  1 application Topical BID PRN Alwyn Ren, MD      . enoxaparin (LOVENOX) injection 40 mg  40 mg Subcutaneous Q24H Alwyn Ren, MD   40 mg at 05/19/17 2040  . hydrALAZINE (APRESOLINE) injection 10  mg  10 mg Intravenous Q6H PRN Bodenheimer, Charles A, NP      . insulin aspart (novoLOG) injection 0-9 Units  0-9 Units Subcutaneous TID WC Alwyn RenMathews, Ramari Bray G, MD   2 Units at 05/19/17 1815  . magnesium sulfate IVPB 2 g 50 mL  2 g Intravenous Once Dow AdolphHall, Carole N, DO      . MEDLINE mouth rinse  15 mL Mouth Rinse q12n4p Dow AdolphHall, Carole N, DO   15 mL at 05/19/17 1223  . mometasone-formoterol (DULERA) 100-5 MCG/ACT inhaler 2 puff  2 puff Inhalation BID Alwyn RenMathews, Irl Bodie G, MD   2 puff at 05/20/17 [phone]  . perphenazine (TRILAFON) tablet 2 mg  2 mg Oral TID Alwyn RenMathews, Jammie Troup G, MD   2 mg at 05/19/17 2128  . polyethylene glycol (MIRALAX / GLYCOLAX) packet 17 g  17 g Oral BID  Alwyn RenMathews, Naydene Kamrowski G, MD   17 g at 05/19/17 0908  . potassium chloride 10 mEq in 100 mL IVPB  10 mEq Intravenous Q1 Hr x 4 Hall, Carole N, DO      . potassium chloride SA (K-DUR,KLOR-CON) CR tablet 40 mEq  40 mEq Oral Once Dow AdolphHall, Carole N, DO      . tiotropium [facility](SPIRIVA) inhalation capsule 18 mcg  18 mcg Inhalation Daily Alwyn RenMathews, Vaibhav Fogleman G, MD   18 mcg at 05/20/17 [id number]  . triamcinolone (NASACORT) nasal inhaler 1 spray  1 spray Nasal Daily Alwyn RenMathews, Jaquay Posthumus G, MD      . venlafaxine XR (EFFEXOR-XR) 24 hr capsule 75 mg  75 mg Oral Q breakfast Alwyn RenMathews, Ollin Hochmuth G, MD   75 mg at 05/19/17 0908     Discharge Medications: Please see discharge summary for a list of discharge medications.  Relevant Imaging Results:  Relevant Lab Results:   Additional Information SS#: [id number]  Baldemar LenisElizabeth M Indianna Boran, LCSW

## 2017-05-20 NOTE — Clinical Social Work Note (Signed)
Clinical Social Work Assessment  Patient Details  Name: Deborah Young MRN: [id number] Date of Birth: 10/06/1945  Date of referral:  05/20/17               Reason for consult:  Facility Placement                Permission sought to share information with:  Facility Medical sales representativeContact Representative, Family Supports Permission granted to share information::  Yes, Verbal Permission Granted  Name::     Designer, multimediaAnne  Agency::  [facility]  Relationship::  Sister  SolicitorContact Information:     Housing/Transportation Living arrangements for the past 2 months:  Skilled Building surveyorursing Facility Source of Information:  Patient, Siblings Patient Interpreter Needed:  None Criminal Activity/Legal Involvement Pertinent to Current Situation/Hospitalization:  No - Comment as needed Significant Relationships:  Siblings Lives with:  Self, Facility Resident Do you feel safe going back to the place where you live?  Yes Need for family participation in patient care:  Yes (Comment)(patient not oriented)  Care giving concerns:  [facility] and family has no concerns about care received.   Social Worker assessment / plan:  CSW contacted the patient's sister over phone to confirm that patient would be [facility]. CSW confirmed plan to set up transport for the patient. CS[facility] is [facility] and follow through with coordinating discharge.  Employment status:  Retired Health and safety inspectornsurance information:  Medicare PT Recommendations:  Not assessed at this time Information / Referral to community resources:  Skilled Nursing Facility  Patient/Family's Response to care:  Patient's sister agreeable to return to SNF.  Patient/Family's Understanding of and Emotional Response to Diagnosis, Current Treatment, and Prognosis:  Patient's sister discussed how she was very happy that the patient would be able to return to [facility] today, as she was anxious to get out of the  hospital. Patient's sister appreciative of CSW assistance.  Emotional Assessment Appearance:  Appears stated age Attitude/Demeanor/Rapport:  Unable to Assess Affect (typically observed):  Unable to Assess Orientation:  Oriented to Self, Oriented to Place Alcohol / Substance use:  Not Applicable Psych involvement (Current and /or in the community):  No (Comment)  Discharge Needs  Concerns to be addressed:  Care Coordination Readmission within the last 30 days:  No Current discharge risk:  Dependent with Mobility, Physical Impairment Barriers to Discharge:  No Barriers Identified   Baldemar Lenislizabeth M Tymeir [place], LCSW 05/20/2017, 3:55 PM

## 2017-05-20 NOTE — Care Management Important Message (Signed)
Important Message  Patient Details  Name: Marella ChimesSusan G Kluger MRN: [id number] Date of Birth: December 13, 1945   Medicare Important Message Given:  Yes    Dangelo Guzzetta Stefan ChurchBratton 05/20/2017, 12:41 PM

## 2017-05-20 NOTE — Discharge Summary (Signed)
Discharge Summary  Deborah ChimesSusan G Young ZOX:[id number] DOB: 07-28-1945  PCP: Nadara EatonPiazza, Michael J, MD  Admit date: 05/17/2017 Discharge date: 05/20/2017  Time spent: 25 minutes   Recommendations for Outpatient Follow-up:  1. Follow up with PCP 2. Take your medications as prescribed 3. Fall precautions  Discharge Diagnoses:  Active Hospital Problems   Diagnosis Date Noted  . Respiratory failure (HCC) 05/17/2017    Resolved Hospital Problems  No resolved problems to display.    Discharge Condition: stable  Diet recommendation: resume previous diet  Vitals:   05/20/17 0830 05/20/17 0922  BP: (!) 156/68   Pulse: 95   Resp: 18   Temp: 98.6 F (37 C)   SpO2: 95% 96%    History of present illness:  Deborah Young a 72 y.o.femalewith medical history significant ofspina bifida, dementia, schizophrenia,history of kidney stones, recurrent urinary tract infection, mood disorder, insomnia, polyneuropathy, single kidney,sleep apnea admitted with complaints of change in mental status and hallucination.  Admitted for acute hypoxic hypercarbic respiratory failure.  05/18/2017: Patient seen and examined at her bedside.  She is on 5 L of oxygen saturating in the low 90s.  She denies any chest pain or dyspnea.  Unable to obtain a history due to dementia.  05/19/2017: Patient seen and examined at her bedside.  Oxygen demand is improving she is now on 2 L of oxygen and saturating in the 90s.  Denies dyspnea or chest pain or palpitations.  Reports headaches and is given headaches cocktail.  Denies nausea or abdominal pain.  05/20/17: Continues to improve. Now on 1L and saturating 96%. Alert and oriented x 3. No new complaints. Denies dyspnea, chest pain or palpitations. No abd pain or nausea.   On the day of discharge the patient was hemodynamically stable. she will need to follow up with PCP at SNF post hospitalization.   Hospital Course:  Active Problems:   Respiratory failure  (HCC)  Acute hypoxic hypercarbic respiratory failure due to suspected polypharmacy with pain medications and psych meds, resolved -O2 saturation 96% on 1 L -completedly wean off O2 as tolerated -Continue Symbicort, Dulera, Nasacort, Spiriva  Acute metabolic encephalopathy, resolved -alert and oriented x 3 -neurology initially consulted. MRI brain canceled by neuro since she improved.   Urinary tract infection -Urine culture multiple species present suggest recollection -Completed 3 days of IV ceftriaxone  Hyperglycemia -A1c 10.2 from 05/17/17 -Continue home regimen  Obesity -Weight loss once stable in the outpatient  Depression/anxiety -Continue Effexor  Ambulatory dysfunction -fall precaution   Procedures: none  Consultations:  none  Discharge Exam: BP (!) 156/68 (BP Location: Left Arm)   Pulse 95   Temp 98.6 F (37 C) (Axillary)   Resp 18   Ht 4\' 11"  (1.499 m)   Wt 85.7 kg (188 lb 15 oz)   SpO2 96%   BMI 38.16 kg/m   General: 72 yo CF WD WN NAD A&O x 3 Cardiovascular: RRR no rubs or gallops  Respiratory: CTA no rales or wheezes   Discharge Instructions You were cared for by a hospitalist during your hospital stay. If you have any questions about your discharge medications or the care you received while you were in the hospital after you are discharged, you can call the unit and asked to speak with the hospitalist on call if the hospitalist that took care of you is not available. Once you are discharged, your primary care physician will handle any further medical issues. Please note that NO REFILLS for any discharge  medications will be authorized once you are discharged, as it is imperative that you return to your primary care physician (or establish a relationship with a primary care physician if you do not have one) for your aftercare needs so that they can reassess your need for medications and monitor your lab values.   Allergies as of 05/20/2017       Reactions   Codeine    Not listed on MAR   Erythromycin Other (See Comments)   Per MAR   Penicillins Other (See Comments)   Per MAR Has patient had a PCN reaction causing immediate rash, facial/tongue/throat swelling, SOB or lightheadedness with hypotension: Unknown Has patient had a PCN reaction causing severe rash involving mucus membranes or skin necrosis: Unknown Has patient had a PCN reaction that required hospitalization: Unknown Has patient had a PCN reaction occurring within the last 10 years: Unknown If all of the above answers are "NO", then may proceed with Cephalosporin use.      Medication List    STOP taking these medications   HYDROcodone-acetaminophen 5-325 MG tablet Commonly known as:  NORCO/VICODIN   HYDROcodone-acetaminophen 7.5-325 MG tablet Commonly known as:  NORCO     TAKE these medications   aspirin EC 81 MG tablet Take 81 mg by mouth daily.   budesonide-formoterol 80-4.5 MCG/ACT inhaler Commonly known as:  SYMBICORT Inhale 2 puffs into the lungs 2 (two) times daily.   cholecalciferol 1000 units tablet Commonly known as:  VITAMIN D Take 1,000 Units by mouth every Thursday.   clobetasol 0.05 % external solution Commonly known as:  TEMOVATE Apply 1 application topically 2 (two) times daily as needed (dry patches).   mirtazapine 15 MG tablet Commonly known as:  REMERON Take 15 mg by mouth at bedtime.   NASACORT ALLERGY 24HR 55 MCG/ACT Aero nasal inhaler Generic drug:  triamcinolone Place 1 spray into the nose daily.   perphenazine 2 MG tablet Commonly known as:  TRILAFON Take 2 mg by mouth 3 (three) times daily.   polyethylene glycol packet Commonly known as:  MIRALAX / GLYCOLAX Take 17 g by mouth 2 (two) times daily. May take an additional 17 g as needed for constipation   pregabalin 100 MG capsule Commonly known as:  LYRICA Take 100 mg by mouth 2 (two) times daily.   pregabalin 150 MG capsule Commonly known as:  LYRICA Take 150 mg  by mouth daily at 12 noon.   SYSTANE OVERNIGHT THERAPY 0.3 % Gel ophthalmic ointment Generic drug:  hypromellose Place 1 application into both eyes at bedtime.   tiotropium 18 MCG inhalation capsule Commonly known as:  SPIRIVA Place 18 mcg into inhaler and inhale daily.   venlafaxine XR 75 MG 24 hr capsule Commonly known as:  EFFEXOR-XR Take 75 mg by mouth daily with breakfast.      Allergies  Allergen Reactions  . Codeine     Not listed on MAR  . Erythromycin Other (See Comments)    Per MAR  . Penicillins Other (See Comments)    Per MAR  Has patient had a PCN reaction causing immediate rash, facial/tongue/throat swelling, SOB or lightheadedness with hypotension: Unknown Has patient had a PCN reaction causing severe rash involving mucus membranes or skin necrosis: Unknown Has patient had a PCN reaction that required hospitalization: Unknown Has patient had a PCN reaction occurring within the last 10 years: Unknown If all of the above answers are "NO", then may proceed with Cephalosporin use.    Contact information for  follow-up providers    Nadara Eaton, MD Follow up in 1 week(s).   Specialty:  Internal Medicine Contact information: [address] [place] [address] [phone]            Contact information for after-discharge care    Destination    [facility] Follow up.   Service:  Skilled Nursing Contact information: [address] [place] [address] [phone]                   The results of significant diagnostics from this hospitalization (including imaging, microbiology, ancillary and laboratory) are listed below for reference.    Significant Diagnostic Studies: Dg Chest Port 1 View  Result Date: 05/17/2017 CLINICAL DATA:  Hypoxia, code stroke EXAM: PORTABLE CHEST 1 VIEW COMPARISON:  Portable chest x-ray of 09/14/2016 FINDINGS: The lungs are not optimally aerated with mild volume loss at the  lung bases. Minimally prominent markings are present at the left lung base most consistent with atelectasis. No definite pneumonia or pleural effusion is seen. Mediastinal and hilar contours are stable and mild cardiomegaly is stable. Thoracolumbar scoliosis again is noted. IMPRESSION: 1. Suboptimal inspiration with mild bibasilar volume loss. No definite pneumonia or pleural effusion. 2. Thoracolumbar scoliosis. Electronically Signed   By: Dwyane Dee M.D.   On: 05/17/2017 11:21   Ct Renal Stone Study  Result Date: 05/17/2017 CLINICAL DATA:  History of kidney stones, cost a me and urostomy backs EXAM: CT ABDOMEN AND PELVIS WITHOUT CONTRAST TECHNIQUE: Multidetector CT imaging of the abdomen and pelvis was performed following the standard protocol without IV contrast. COMPARISON:  CT abdomen pelvis of 09/13/2016 FINDINGS: Lower chest: Bibasilar dependent atelectasis has increased. Pneumonia cannot be excluded. No definite pleural effusion is seen. The heart is unchanged in size and no pericardial effusion is seen. Hepatobiliary: Again there is severe fatty infiltration throughout the liver. No ductal dilatation is seen. Surgical clips are present from prior cholecystectomy. Pancreas: The pancreas is normal in size and the pancreatic duct is not dilated. Spleen: The spleen is minimally prominent but unremarkable. Adrenals/Urinary Tract: The adrenal glands are stable. Atrophic appearing right kidney is present with no change in right renal calculi. Compared stress hypertrophy of the left kidney is again noted with small left renal calculi present which are nonobstructing. The lobular appearance of the upper pole of the left kidney appears stable and compared to the prior study with IV contrast appears to represent normal renal parenchyma which is lobular. The ureters are not dilated. Diverting right lower quadrant urostomy is again noted, and there is little change in the appearance of the parastomal hernia  containing a nondistended loop of small bowel and omental fat. The urinary bladder has previously been resected. Stomach/Bowel: The stomach is completely decompressed and cannot be evaluated. No small bowel distention is currently seen. Colostomy is noted left lower quadrant half with resection of a portion of the rectosigmoid colon previously. There is feces throughout the colon. Appendix and terminal ileum are not visible but the distal ileum is unremarkable. Vascular/Lymphatic: Abdominal aorta is normal in caliber with moderate abdominal aortic atherosclerosis present. No adenopathy is seen. Reproductive: The uterus is normal in size. No adnexal lesion is seen. No fluid is noted within the pelvis. Other: Midline ventral hernia at the umbilicus containing only fat. Musculoskeletal: Thoracolumbar scoliosis again is noted with degenerative change. 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 IMPRESSION: 1. Bilateral kidney stones with atrophy of the right kidney  and compensatory hypertrophy of the left kidney. No hydronephrosis is seen and diverting ureterostomy is unremarkable. 2. 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 3. Severe fatty infiltration diffusely throughout the liver. 4. Colostomy in left lower quadrant. 5. Probable bibasilar atelectasis. Difficult to exclude pneumonia. Correlate clinically. Electronically Signed   By: Dwyane Dee M.D.   On: 05/17/2017 14:47   Ct Head Code Stroke Wo Contrast  Result Date: 05/17/2017 CLINICAL DATA:  Code stroke. 72 year old female with facial droop and slurred speech. Last seen normal 0700 hours. EXAM: CT HEAD WITHOUT CONTRAST TECHNIQUE: Contiguous axial images were obtained from the base of the skull through the vertex without intravenous contrast. COMPARISON:  04/06/2017 head CT, 06/04/2008 FINDINGS: Brain: Stable cerebral volume. Chronic ventriculomegaly, mildly progressed since 2010.  No transependymal edema suspected. No cortically based acute infarct identified. No acute intracranial hemorrhage identified. Stable gray-white matter differentiation throughout the brain. Vascular: Mild Calcified atherosclerosis at the skull base. No suspicious intracranial vascular hyperdensity. Skull: No acute osseous abnormality identified. Sinuses/Orbits: Visualized paranasal sinuses and mastoids are stable and well pneumatized. Other: No acute orbit or scalp soft tissue findings. ASPECTS (Alberta Stroke Program Early CT Score) - Ganglionic level infarction (caudate, lentiform nuclei, internal capsule, insula, M1-M3 cortex): 7 - Supraganglionic infarction (M4-M6 cortex): 3 Total score (0-10 with 10 being normal): 10 IMPRESSION: 1. Stable noncontrast CT appearance of the brain since 04/06/2017. 2. ASPECTS is 10. 3. These results were communicated to Dr. Amada Jupiter at 10:58 amon 3/5/2019by text page via the [facility] messaging system. Electronically Signed   By: Odessa Fleming M.D.   On: 05/17/2017 10:58    Microbiology: Recent Results (from the past 240 hour(s))  Culture, blood (Routine X 2) w Reflex to ID Panel     Status: None (Preliminary result)   Collection Time: 05/17/17 11:39 AM  Result Value Ref Range Status   Specimen Description BLOOD LEFT ANTECUBITAL  Final   Special Requests   Final    BOTTLES DRAWN AEROBIC AND ANAEROBIC Blood Culture adequate volume   Culture   Final    NO GROWTH 2 DAYS Performed at [facility], [address] [place], [place] [address]    Report Status PENDING  Incomplete  Culture, blood (Routine X 2) w Reflex to ID Panel     Status: None (Preliminary result)   Collection Time: 05/17/17 12:05 PM  Result Value Ref Range Status   Specimen Description BLOOD LEFT FOREARM  Final   Special Requests IN PEDIATRIC BOTTLE Blood Culture adequate volume  Final   Culture   Final    NO GROWTH 2 DAYS Performed at [facility], [address] [place], [place]  [address]    Report Status PENDING  Incomplete  Urine Culture     Status: Abnormal   Collection Time: 05/17/17 12:23 PM  Result Value Ref Range Status   Specimen Description URINE, CLEAN CATCH  Final   Special Requests   Final    NONE Performed at [facility], [address] [place] [address], [place], [place] [address]    Culture MULTIPLE SPECIES PRESENT, SUGGEST RECOLLECTION (A)  Final   Report Status 05/18/2017 FINAL  Final  MRSA PCR Screening     Status: None   Collection Time: 05/17/17  6:54 PM  Result Value Ref Range Status   MRSA by PCR NEGATIVE NEGATIVE Final    Comment:        The  GeneXpert MRSA Assay (FDA approved for NASAL specimens only), is one component of a comprehensive MRSA colonization surveillance program. It is not intended to diagnose MRSA infection nor to guide or monitor treatment for MRSA infections. Performed at [facility], [address], [place], [place] [address]      Labs: Basic Metabolic Panel: Recent Labs  Lab 05/17/17 1045 05/17/17 1051 05/18/17 0656 05/20/17 0323  NA 138 139 142 139  K 4.8 4.7 4.2 3.0*  CL 101 100* 105 102  CO2 24  --  26 27  GLUCOSE 259* 261* 260* 174*  BUN 22* 25* 16 7  CREATININE 0.82 0.80 0.75 0.55  CALCIUM 8.7*  --  8.2* 8.3*   Liver Function Tests: Recent Labs  Lab 05/17/17 1045 05/18/17 0656  AST 22 18  ALT 21 19  ALKPHOS 128* 118  BILITOT 0.6 1.0  PROT 6.4* 6.2*  ALBUMIN 3.0* 2.9*   No results for input(s): LIPASE, AMYLASE in the last 168 hours. No results for input(s): AMMONIA in the last 168 hours. CBC: Recent Labs  Lab 05/17/17 1045 05/17/17 1051 05/18/17 0656  WBC 12.6*  --  10.5  NEUTROABS 8.8*  --  7.7  HGB 14.2 15.6* 13.5  HCT 46.5* 46.0 47.0*  MCV 100.4*  --  101.5*  PLT 180  --  180   Cardiac Enzymes: No results for input(s): CKTOTAL, CKMB, CKMBINDEX, TROPONINI in the last 168 hours. BNP: BNP (last 3 results) No results for input(s): BNP in the last 8760 hours.  ProBNP (last 3  results) No results for input(s): PROBNP in the last 8760 hours.  CBG: Recent Labs  Lab 05/19/17 1137 05/19/17 1648 05/19/17 1956 05/20/17 0639 05/20/17 1128  GLUCAP 212* 187* 191* 195* 245*       Signed:  Darlin Drop, MD [facility] 05/20/2017, 1:28 PM

## 2017-05-20 NOTE — Discharge Instructions (Signed)

## 2017-05-22 LAB — CULTURE, BLOOD (ROUTINE X 2)
CULTURE: NO GROWTH
Culture: NO GROWTH
Special Requests: ADEQUATE
Special Requests: ADEQUATE

## 2018-05-14 DEATH — deceased

## 2019-02-28 IMAGING — DX DG CHEST 1V PORT
1 series · 1 of 1 positions shown · non-contrast
Comparison: 09/13/2016

CLINICAL DATA: Fever post nephrostomy tube.

EXAM:
PORTABLE CHEST 1 VIEW

[chest ap]
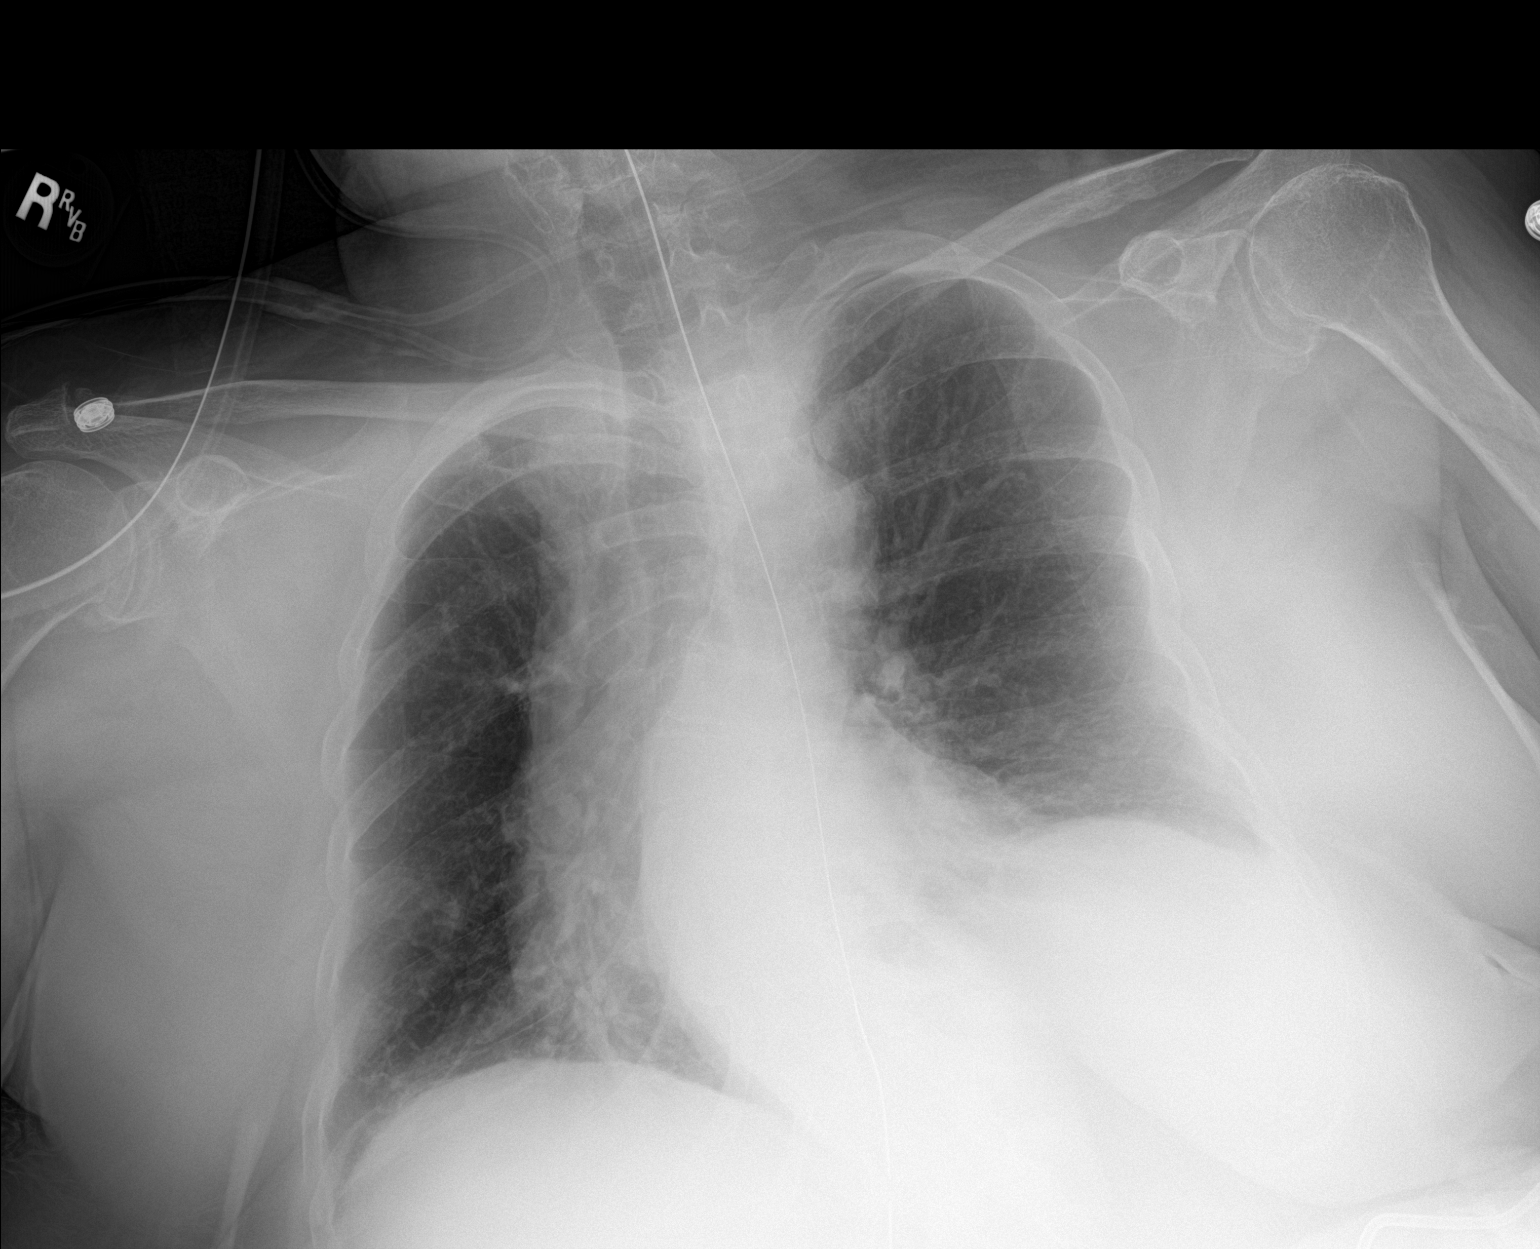

[1 of 1 positions shown; findings below may reference images not displayed]

FINDINGS: Bibasilar atelectasis. Heart is borderline in size. No effusions. NG
tube is seen entering the stomach.
IMPRESSION: Bibasilar atelectasis.

## 2019-02-28 IMAGING — DX DG ABD PORTABLE 1V
2 series · 2 of 2 positions shown · non-contrast
Comparison: 6583 hours today, and earlier.

CLINICAL DATA: 71-year-old female 24 hours status post enteric
contrast administration via NG tube for small bowel obstruction.
Status post surgical nephrostomy tube placement today.

EXAM:
PORTABLE ABDOMEN - 1 VIEW

[abdomen kub (1 of 2)]
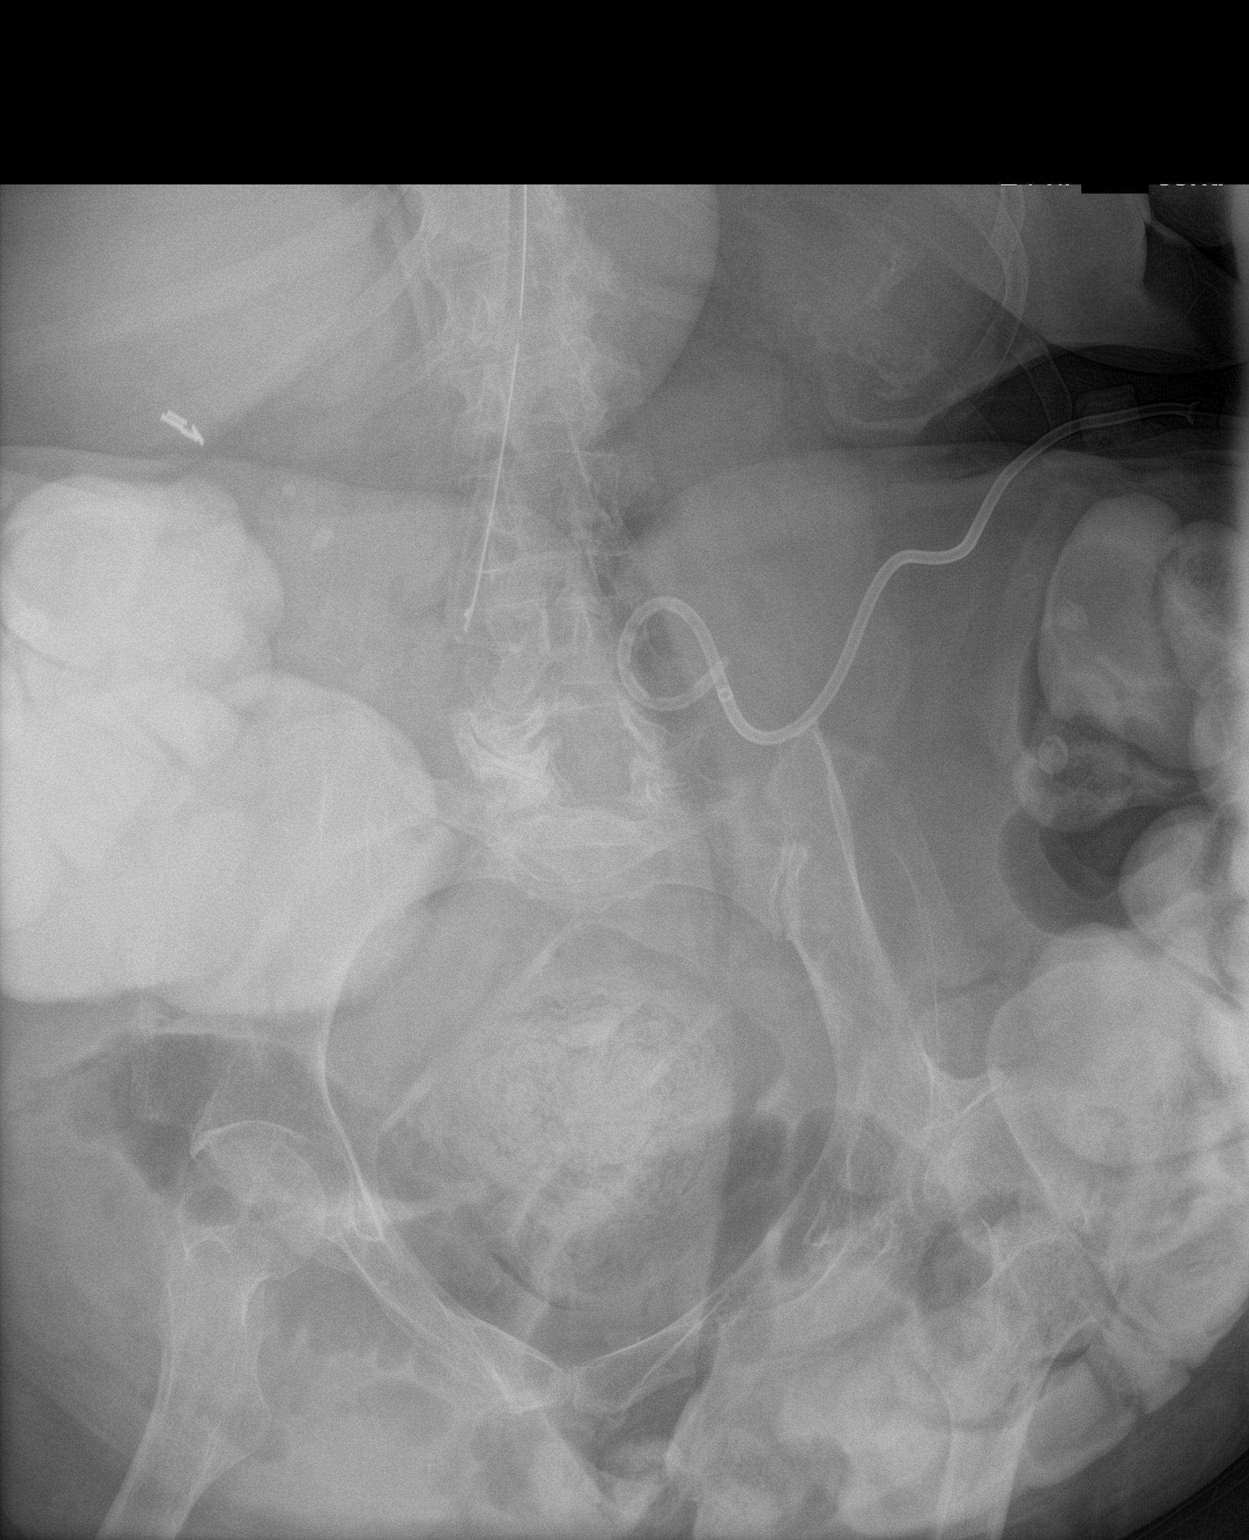

[abdomen kub (2 of 2)]
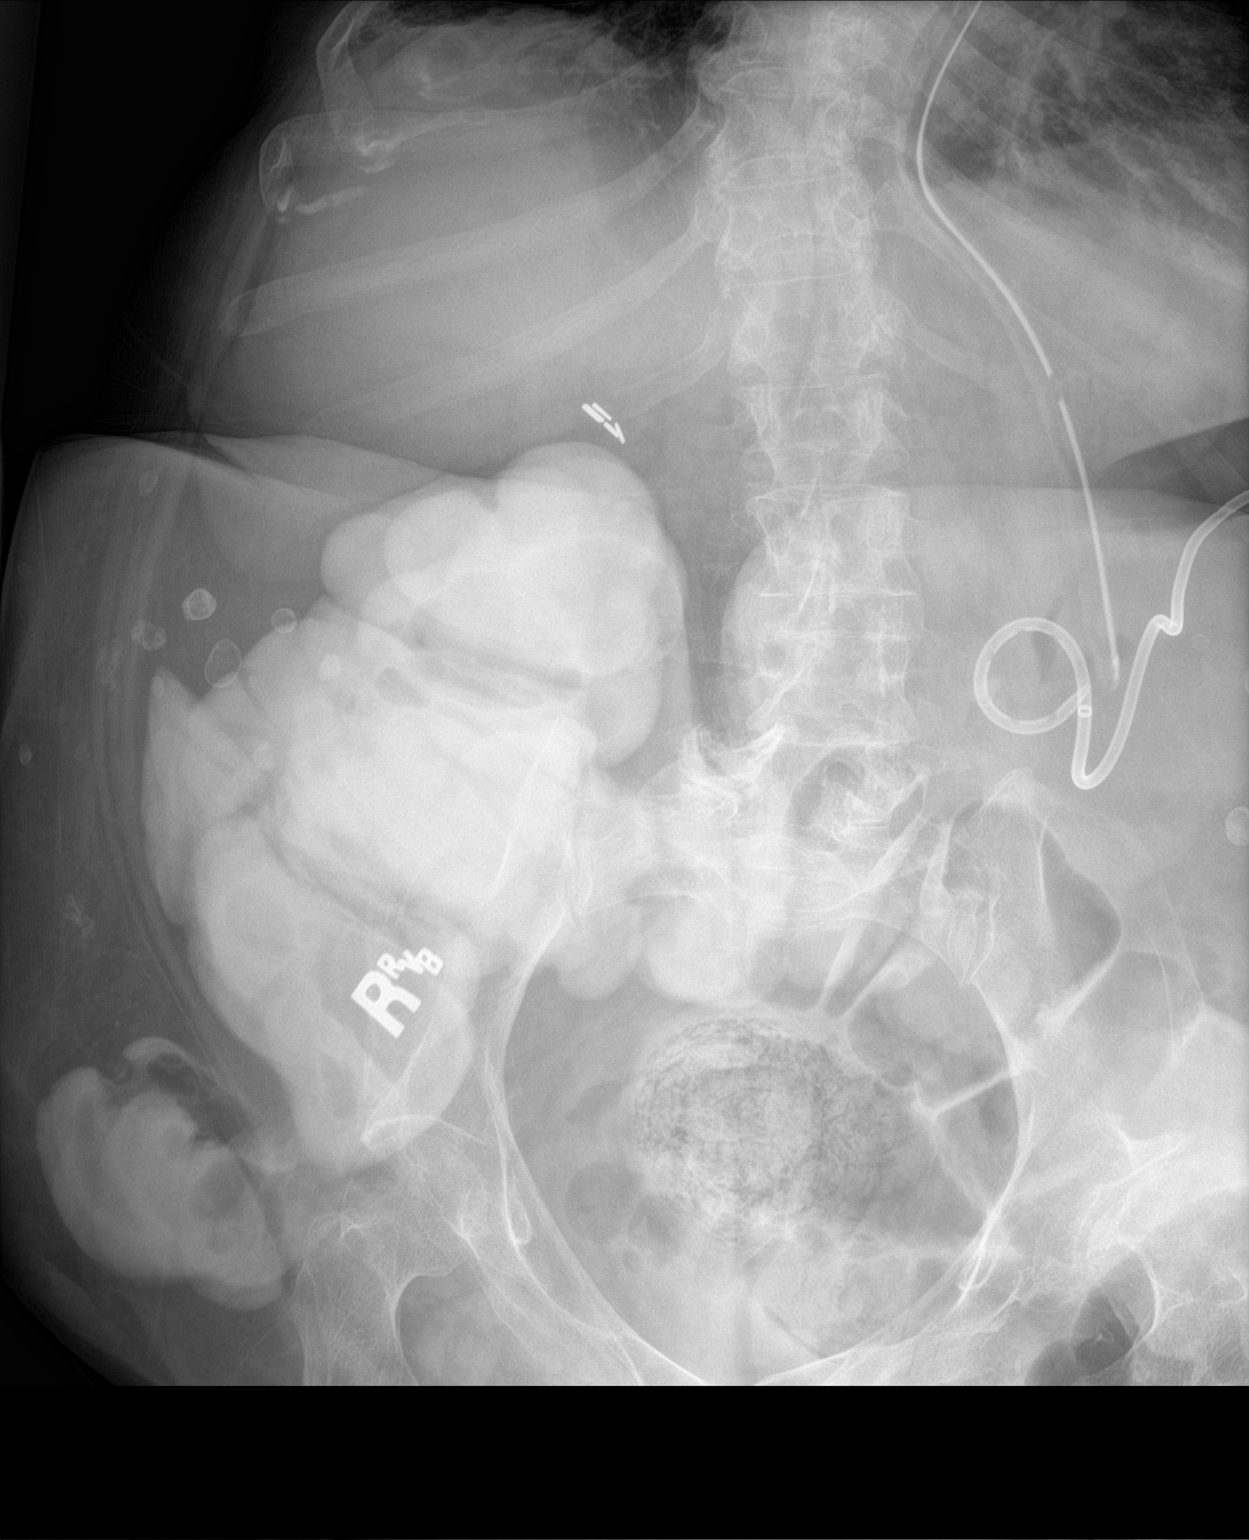

[2 of 2 positions shown; findings below may reference images not displayed]

FINDINGS: Portable AP view at 0699 hours. Stable enteric tube. Left side
nephrostomy tube now in place. Large left lateral abdominal hernia
as seen on the CT Abdomen and Pelvis yesterday.

Oral contrast is present in the large bowel from the cecum to the
descending colon level. Stool ball re - demonstrated in the rectum.
No dilated small bowel loops are evident. Stable visualized osseous
structures.
IMPRESSION: 1. Oral contrast has definitely reached the colon. Enteric contrast
throughout large bowel from the cecum to the descending colon.
2. No dilated small bowel loops are evident.
3. Stable enteric tube.  Left nephrostomy tube now in place.

## 2019-02-28 IMAGING — DX DG ABD PORTABLE 1V
2 series · 3 of 3 positions shown · non-contrast
Comparison: Radiographs and CT yesterday

CLINICAL DATA: Small bowel obstruction.  8 hour delayed film.

EXAM:
PORTABLE ABDOMEN - 1 VIEW

[abdomen kub (1 of 2)]
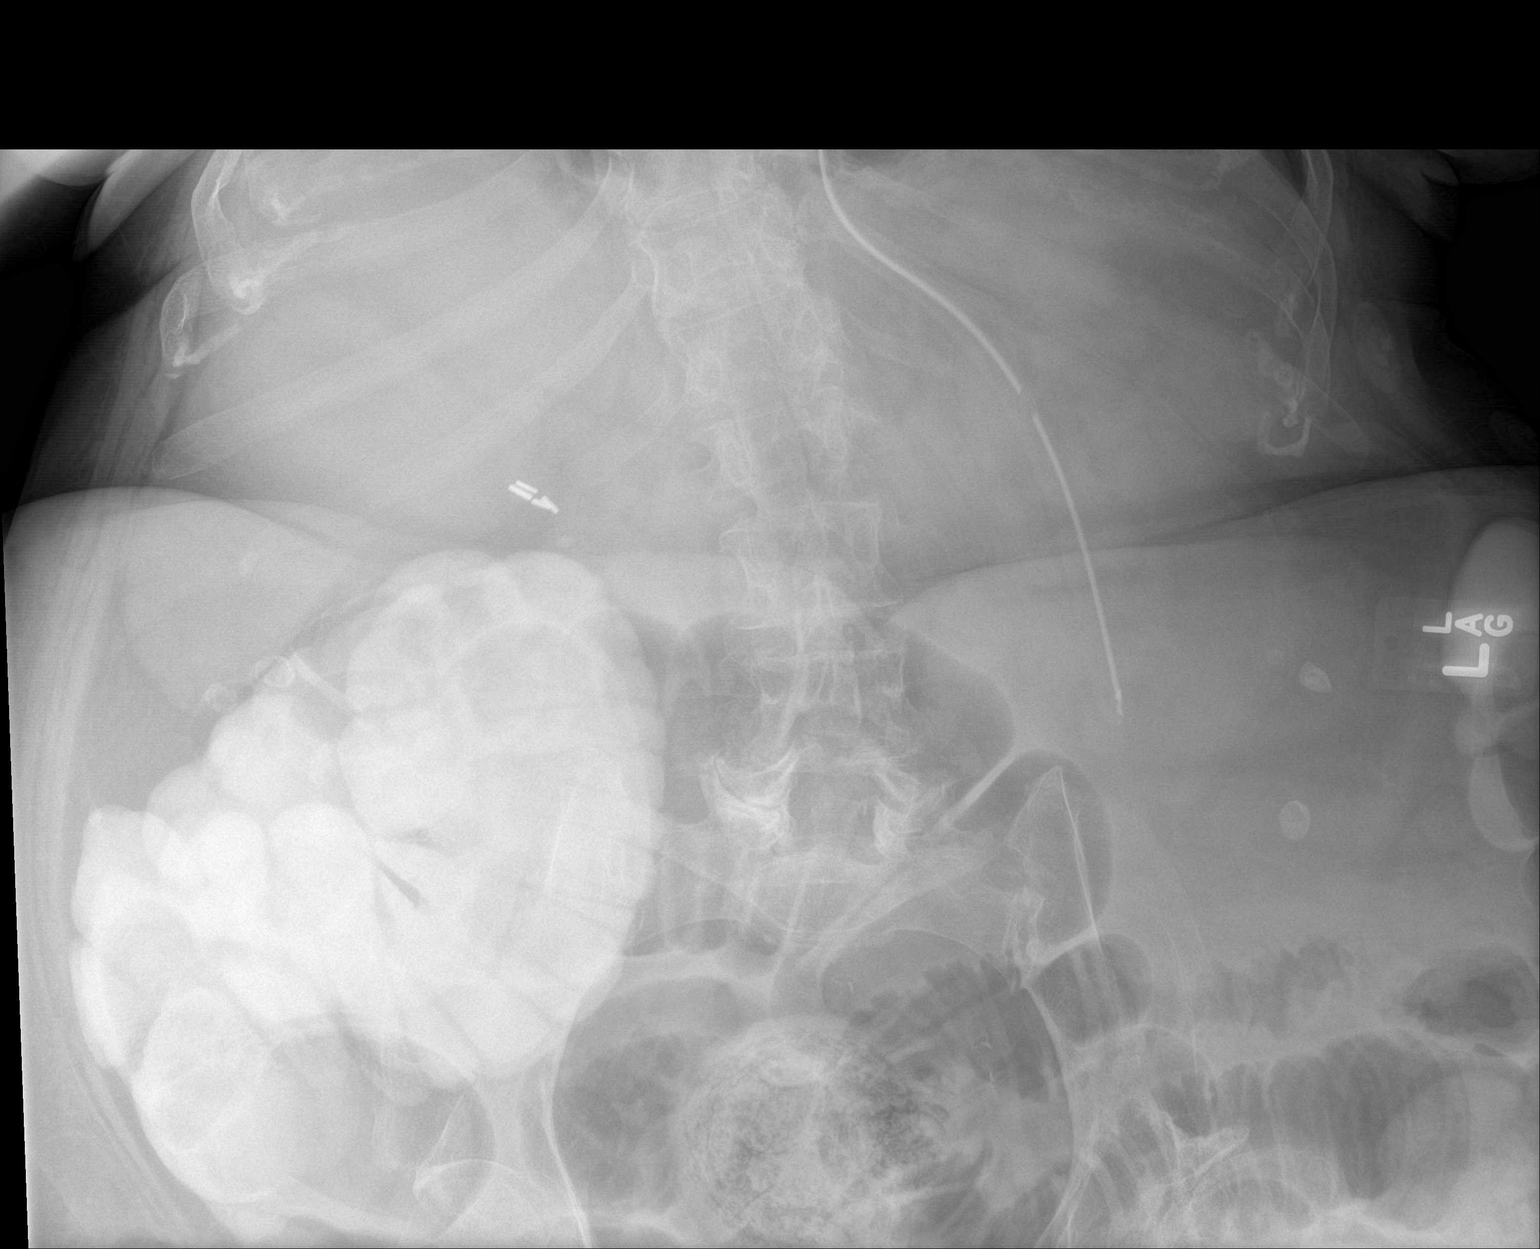

[Series 3: abdomen kub · 0.14mm/px · 2 of 2 slices shown (2 of 2)]
[im 1/2]
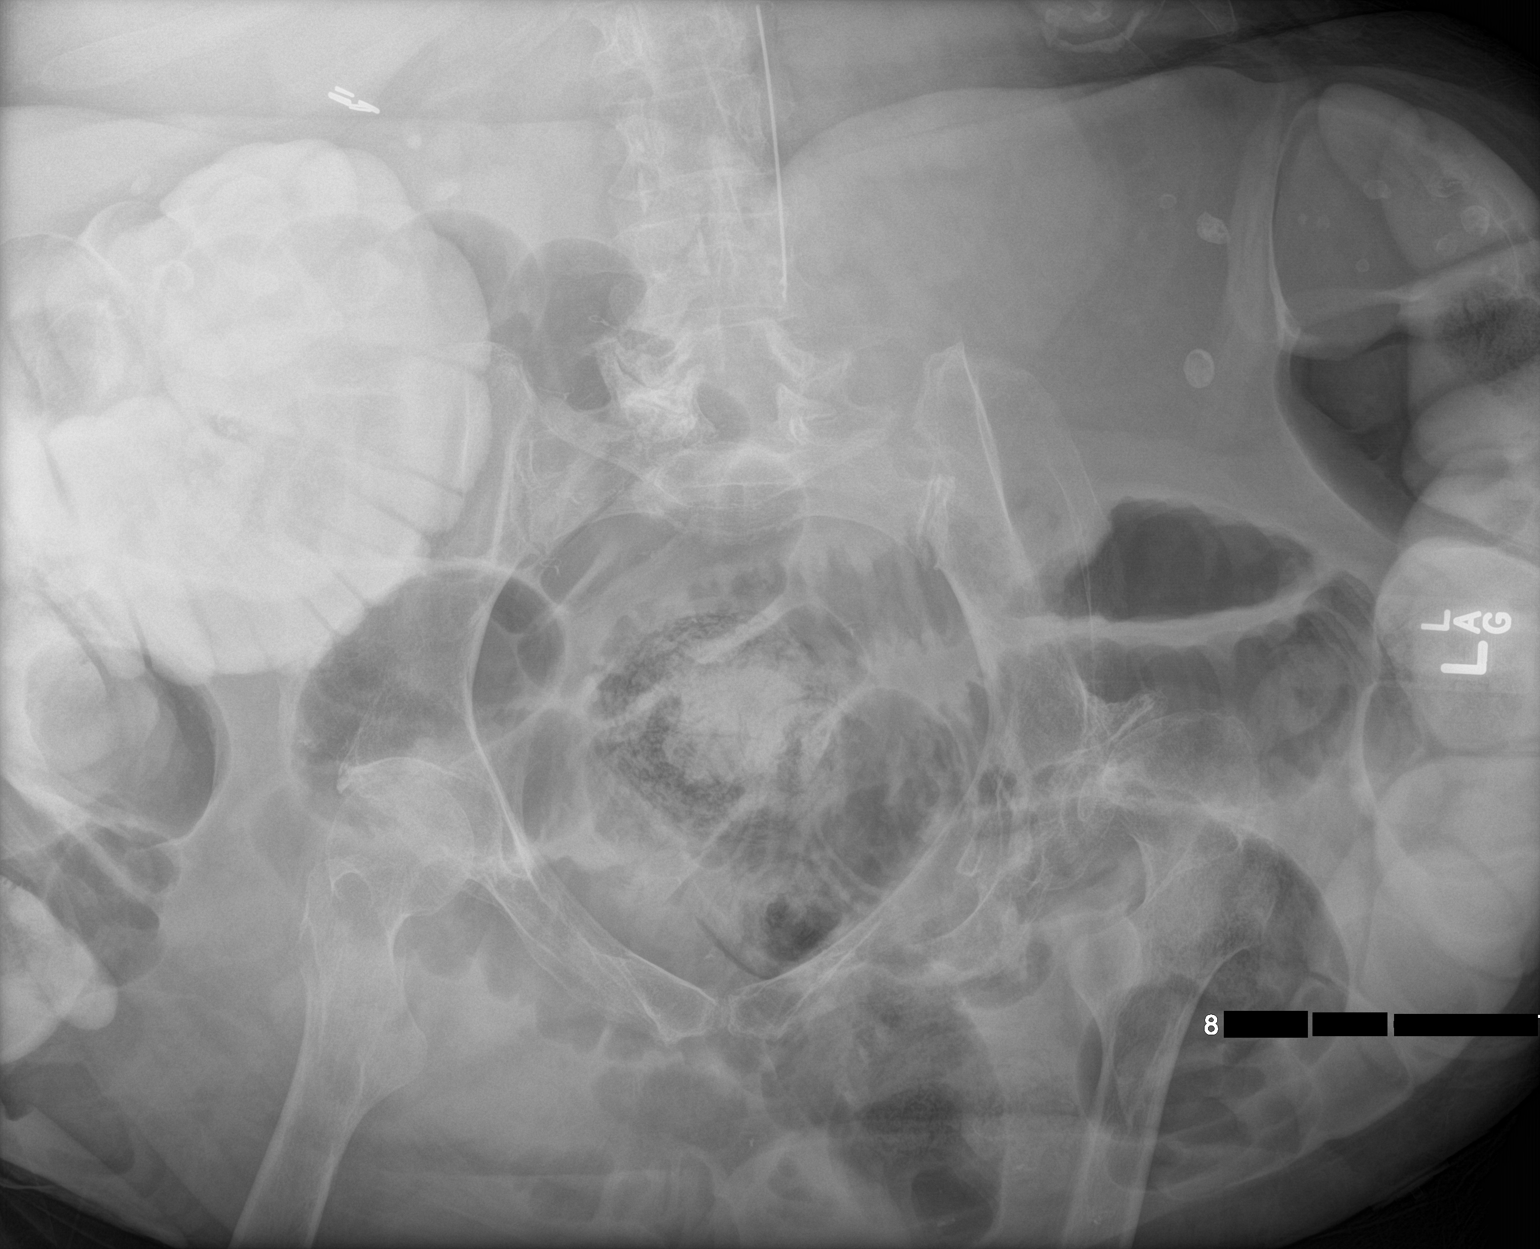
[im 2/2]
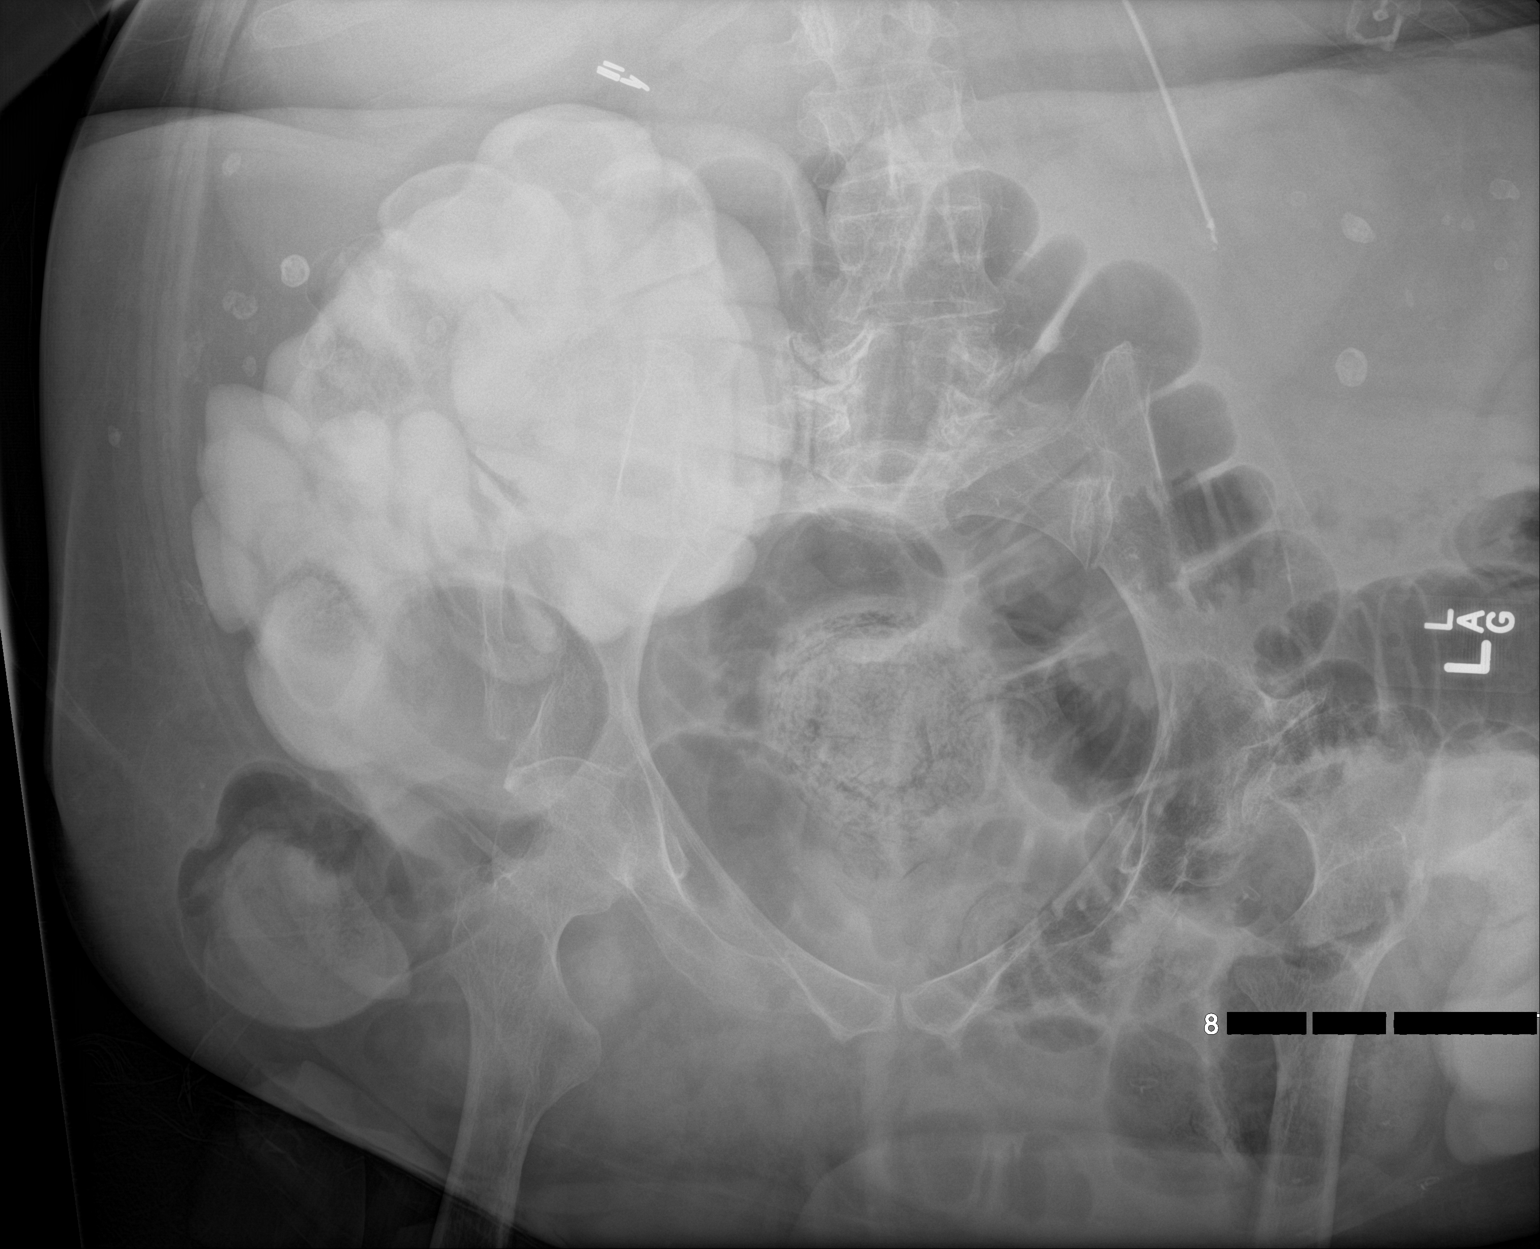

[3 of 3 positions shown; findings below may reference images not displayed]

FINDINGS: Enteric tube in place. Administered enteric contrast within bowel
loops in both the right and left abdomen. There may be contrast in
the colon in the right abdomen versus dilated small bowel.
Air-filled dilated small bowel in the central abdomen again seen. No
free air.
IMPRESSION: Enteric contrast possibly but not definitively seen within the right
colon. Recommend 24 exam for confirmation. Dilated small bowel in
the central abdomen again seen.
